# Patient Record
Sex: Female | Born: 1935 | Race: White | Hispanic: No | Smoking: Never smoker
Health system: Southern US, Community
[De-identification: ages and names within clinical notes are randomized; demographics above are authoritative.]

## PROBLEM LIST (undated history)

## (undated) DIAGNOSIS — E7800 Pure hypercholesterolemia, unspecified: Secondary | ICD-10-CM

## (undated) DIAGNOSIS — M1990 Unspecified osteoarthritis, unspecified site: Secondary | ICD-10-CM

## (undated) DIAGNOSIS — Z9889 Other specified postprocedural states: Secondary | ICD-10-CM

## (undated) DIAGNOSIS — K8020 Calculus of gallbladder without cholecystitis without obstruction: Secondary | ICD-10-CM

## (undated) DIAGNOSIS — K219 Gastro-esophageal reflux disease without esophagitis: Secondary | ICD-10-CM

## (undated) DIAGNOSIS — I639 Cerebral infarction, unspecified: Secondary | ICD-10-CM

## (undated) DIAGNOSIS — R112 Nausea with vomiting, unspecified: Secondary | ICD-10-CM

## (undated) DIAGNOSIS — I10 Essential (primary) hypertension: Secondary | ICD-10-CM

## (undated) HISTORY — PX: JOINT REPLACEMENT: SHX530

## (undated) HISTORY — PX: TOTAL HIP ARTHROPLASTY: SHX124

## (undated) HISTORY — PX: CATARACT EXTRACTION W/ INTRAOCULAR LENS IMPLANT: SHX1309

---
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
## 1999-08-18 ENCOUNTER — Encounter: Payer: Self-pay | Admitting: Family Medicine

## 1999-08-18 ENCOUNTER — Encounter: Admission: RE | Admit: 1999-08-18 | Discharge: 1999-08-18 | Payer: Self-pay | Admitting: Family Medicine

## 2000-08-20 ENCOUNTER — Encounter: Payer: Self-pay | Admitting: Family Medicine

## 2000-08-20 ENCOUNTER — Encounter: Admission: RE | Admit: 2000-08-20 | Discharge: 2000-08-20 | Payer: Self-pay | Admitting: Family Medicine

## 2001-08-22 ENCOUNTER — Encounter: Payer: Self-pay | Admitting: Family Medicine

## 2001-08-22 ENCOUNTER — Encounter: Admission: RE | Admit: 2001-08-22 | Discharge: 2001-08-22 | Payer: Self-pay | Admitting: Family Medicine

## 2002-08-26 ENCOUNTER — Encounter: Admission: RE | Admit: 2002-08-26 | Discharge: 2002-08-26 | Payer: Self-pay | Admitting: Family Medicine

## 2002-08-26 ENCOUNTER — Encounter: Payer: Self-pay | Admitting: Family Medicine

## 2004-01-06 ENCOUNTER — Encounter: Admission: RE | Admit: 2004-01-06 | Discharge: 2004-01-06 | Payer: Self-pay | Admitting: Family Medicine

## 2005-01-24 ENCOUNTER — Encounter: Admission: RE | Admit: 2005-01-24 | Discharge: 2005-01-24 | Payer: Self-pay | Admitting: Family Medicine

## 2005-06-13 ENCOUNTER — Encounter: Admission: RE | Admit: 2005-06-13 | Discharge: 2005-06-13 | Payer: Self-pay | Admitting: Family Medicine

## 2005-10-18 ENCOUNTER — Inpatient Hospital Stay (HOSPITAL_COMMUNITY): Admission: RE | Admit: 2005-10-18 | Discharge: 2005-10-24 | Payer: Self-pay | Admitting: Orthopedic Surgery

## 2005-10-23 ENCOUNTER — Ambulatory Visit: Payer: Self-pay | Admitting: Physical Medicine & Rehabilitation

## 2006-01-25 ENCOUNTER — Encounter: Admission: RE | Admit: 2006-01-25 | Discharge: 2006-01-25 | Payer: Self-pay | Admitting: Family Medicine

## 2006-05-28 ENCOUNTER — Inpatient Hospital Stay (HOSPITAL_COMMUNITY): Admission: RE | Admit: 2006-05-28 | Discharge: 2006-05-31 | Payer: Self-pay | Admitting: Orthopedic Surgery

## 2007-01-30 ENCOUNTER — Encounter: Admission: RE | Admit: 2007-01-30 | Discharge: 2007-01-30 | Payer: Self-pay | Admitting: Family Medicine

## 2008-02-03 ENCOUNTER — Encounter: Admission: RE | Admit: 2008-02-03 | Discharge: 2008-02-03 | Payer: Self-pay | Admitting: Family Medicine

## 2010-06-17 NOTE — Op Note (Signed)
[name], [name] NO.:  [id number]   MEDICAL RECORD NO.:  [id number]          PATIENT TYPE:  INP   LOCATION:  NA                           FACILITY:  [facility]   PHYSICIAN:  Ollen Gross, M.D.    DATE OF BIRTH:  [birth year]   DATE OF PROCEDURE:  05/28/2006  DATE OF DISCHARGE:                               OPERATIVE REPORT   PREOPERATIVE DIAGNOSIS:  Osteoarthritis of right hip.   POSTOPERATIVE DIAGNOSIS:  Osteoarthritis of right hip.   PROCEDURE:  Right total hip arthroplasty.   SURGEON:  Ollen Gross, MD   ASSISTANT:  Avel Peace, PA-C   ANESTHESIA:  General.   BLOOD LOSS:  150 mL.   DRAIN:  None.   COMPLICATION:  None.   CLINICAL NOTE:  Stable, to Recovery.   BRIEF CLINICAL NOTE:  [name] is a 77 year old female with end-stage  arthritis of the right hip and progressive worsening pain and  dysfunction.  She has had a successful left total hip arthroplasty and  presents now for right total hip arthroplasty.   PROCEDURE IN DETAIL:  After successful administration of general  anesthetic, the patient is placed in the left lateral decubitus position  with the right side up and held with the hip positioner.  The right  lower extremity is isolated from her perineum with plastic drapes and  prepped and draped in the usual sterile fashion.  A short posterolateral  incision is made with a 10 blade through the subcutaneous tissue to the  level of the fascia lata, which is incised in line with the skin  incision.  Sciatic nerve is palpated and protected and the short  rotators isolated off the femur.  Capsulectomy is performed and the hip  is dislocated.  She had a grossly dysplastic femoral head.  Center of  the head is marked and a trial prosthesis placed such that the center of  the trial head corresponds to the center of native femoral head.  Osteotomy line is marked on the femoral neck and osteotomy made with an  oscillating saw.  Femoral head is  removed and then the femur retracted  anteriorly to gain acetabular exposure.   The acetabular retractors are placed and osteophytes and labrum removed.  Her acetabulum was quite dysplastic.  With the 43 reamer, I got her  medialized and then went up in increments of 2 to the expand to the rim  and got a good fit between the anterior and posterior columns.  I reamed  to 49 mm and a 50-mm Pinnacle acetabular shell was placed in anatomic  position and transfixed with 2 dome screws.  Trial 28-mm neutral +4  liner was placed.   Femur is prepared with a canal finder and irrigation.  Axial reaming is  performed up to 11.5 mm, proximal reaming to a 16B and the sleeve  machined to a small.  A 16B small trial sleeve is placed with a 16 x 11  stem and the first 36 +6 neck.  This gave too much length and offset and  I could not get  a good reduction.  I went to a 30 +4, which allowed for  excellent reduction with excellent stability, full extension, full  external rotation, 70 degrees of flexion, 40 degrees of adduction, 90  degrees of internal rotation and 90 degrees of flexion and 70 degrees of  internal rotation.  By placing the right leg on top of the left, it felt  as if the leg lengths were now equalized.  This right leg was about an  inch short prior.  The hip was then dislocated and all trials were  removed.  The permanent apex hole plug was placed into the acetabular  shell, then the permanent 28-mm neutral +4 Marathon liner is placed.  On  the femoral side, we placed a 16B small sleeve with the 16 x 11 stem and  30 +4 neck, matching her native anteversion.  The permanent 28 +0 head  is placed and the hip is reduced with the same stability parameters.  The wound is then copiously irrigated with saline solution and the short  rotators reattached to the femur through drill holes.  Fascia lata is  closed with interrupted #1 Vicryl, subcu closed with #1 and #2-0 Vicryl  and subcuticular  running 4-0 Monocryl.  The incision is then cleaned and  dried and Steri-Strips and a bulky sterile dressing applied.  She is  placed into a knee immobilizer, awakened and transported to Recovery in  stable condition.      Ollen Gross, M.D.  Electronically Signed     FA/MEDQ  D:  05/28/2006  T:  05/29/2006  Job:  [id number]

## 2010-06-17 NOTE — Discharge Summary (Signed)
NAMEKAROLINA, ZAMOR NO.:  [id number]   MEDICAL RECORD NO.:  [id number]          PATIENT TYPE:  INP   LOCATION:  1619                         FACILITY:  [facility]   PHYSICIAN:  Ollen Gross, M.D.    DATE OF BIRTH:  05-16-1933   DATE OF ADMISSION:  05/28/2006  DATE OF DISCHARGE:  05/31/2006                               DISCHARGE SUMMARY   ADMITTING DIAGNOSES:  1. Osteoarthritis, right hip.  2. Hypertension.  3. Reflux disease.  4. Hypercholesterolemia.  5. Osteoporosis.   DISCHARGE DIAGNOSES:  1. Osteoarthritis, right hip, status post right total hip.  2. Postoperative hypokalemia, improved.  3. Hypertension.  4. Reflux disease.  5. Hypercholesterolemia.  6. Osteoporosis.   PROCEDURE:  April 28, right total hip.   SURGEON:  Dr. Lequita Halt.   ASSISTANT:  Alexzandrew Julien Girt, PA-C.   CONSULTS:  None.   BRIEF HISTORY:  Ms. Goette is a 77 year old female with end-stage  arthritis of right hip, progressive worsening pain and dysfunction,  successful left total hip, now presents for right total hip.   LABORATORY DATA:  Preop CBC:  hemoglobin 13.5, hematocrit 38.4, white  cell count 5.6.  Postop hemoglobin 10.6, drifted down to 10.1 last  night.  H&H 9.4 and 26.3.  PT/PTT preop 12.8 and 31 respectively.  INR  1.  Serial pro times followed.  PT/INR 20.2 and 1.7.  Chem panel on  admission slightly elevated, total bili of 1.4.  Remaining Chem panel  within normal limits.  Serial BMETs were followed.  Electrolytes was  noted have a drop in the potassium at 4.7 to 3.2, back to 3.6.  Preop UA  negative.  Blood group type A+.   X-rays May 21, 2006, severe advanced OA right hip, previous left total  hip.   HOSPITAL COURSE:  The patient admitted to [facility],  tolerated procedure well.  Later send to recovery room, orthopedic  floor.  Given PCA and p.o. analgesic pain control following surgery.  Coumadin for DVT prophylaxis.  Did have a little bit of  nausea on the  evening of surgery, but was doing a little bit better on the morning of  day 1.  Started to get up out of bed with therapy.  Started back on her  home meds, and by day 2 she was doing much better.  No nausea.  Better  pain control.  Started getting up with PT.  She actually ambulated about  100 feet, and later 180 feet.  Hemoglobin looked good.  Dressing  changed.  Incision looked good.  By day 3 she was doing very well, more  independent, tolerating her meds was discharged home.   DISCHARGE/PLAN:  1. The patient was discharged home on May 31, 2006.  2. Discharge diagnoses, please see above.  3. Discharge meds:  Coumadin, Vicodin, Robaxin, Nu-Iron.   DIET:  Heart-healthy cardiac diet.   FOLLOWUP:  Two weeks.   ACTIVITY:  Partial weightbearing, 25-50%, home health PT, home health  nursing.  Total hip protocol, hip precautions.   DISPOSITION:  Home.   CONDITION ON DISCHARGE:  Improved.      Alexzandrew L. Perkins, P.A.C.      Ollen Gross, M.D.  Electronically Signed    ALP/MEDQ  D:  07/24/2006  T:  07/24/2006  Job:  [id number]   cc:   Ollen Gross, M.D.  Fax: [phone]   Gloriajean Dell. Andrey Campanile, M.D.  Fax: [phone]

## 2010-06-17 NOTE — Discharge Summary (Signed)
[name], [name] NO.:  [id number]   MEDICAL RECORD NO.:  [id number]          PATIENT TYPE:  INP   LOCATION:  1509                         FACILITY:  [facility]   PHYSICIAN:  Ollen Gross, M.D.    DATE OF BIRTH:  [birth year]   DATE OF ADMISSION:  10/18/2005  DATE OF DISCHARGE:  10/24/2005                                 DISCHARGE SUMMARY   ADMISSION DIAGNOSES:  1. Osteoarthritis left hip, end-stage with protrusio deformity.  2. Hypertension.  3. Hypercholesterolemia.  4. Reflux disease.  5. Osteoporosis.  6. Osteoarthritis.   DISCHARGE DIAGNOSES:  1. Osteoarthritis left hip, status post left total hip arthroplasty.  2. Postoperative nausea, improved.  3. Postoperative acute blood loss anemia, did not require transfusion.  4. Postoperative hyponatremia, improving.  5. Postoperative hypokalemia, improved.  6. Hypertension.  7. Hypercholesterolemia.  8. Reflux disease.  9. Osteoporosis.  10.Osteoarthritis.   PROCEDURE:  October 18, 2005, left total hip.  Surgeon Dr. Homero Fellers Aluisio.  Assistant, Alexzandrew L. Perkins, P.A.C.  Anesthesia general.   CONSULTATIONS:  Rehab services.   BRIEF HISTORY:  Ms. Funchess is a 77 year old female with end-stage arthritis  of both hips, left more symptomatic than right, that failed nonoperative  management.  Now presents for total hip arthroplasty.   LABORATORY DATA:  CBC on admission a hemoglobin of 14.1, hematocrit of 39.7,  white cell count normal at 5.1.  Postoperative hemoglobin 9.9 (drifted down  to 9.3 and stabilized).  Last H&H 9.3 and 25.8.  PT and PTT on admission  12.9 and 32 respectively.  INR 1.0.  Should have pro time to follow.  Last  know PT/INR 27.0 and INR 2.4.  Chem panel on admission slightly elevated  glucose of 117, elevated total bilirubin of 1.7.  The remaining Chem panel  within normal limits.  Serial BMETs were followed.  Sodium did drop from 139  down to 132 back up to 133 on the increased  potassium from 4.1 down to 2.9  back up to 4.0.  The remaining electrolytes remained within the normal  limits.  Calcium dropped from 9.6 to 7.2 back up to 8.9.  Preoperative UA  negative.  Blood group type A positive.  Portable hip and pelvis films:  Satisfactory left hip replacement.  Preoperative left hip film October 16, 2005:  Marked degenerative changes left hip, suspicious focal radiolucent  lesion in the right proximal femoral shaft.  It appears to be a definite  finding, particularly on lateral.  Repeat would be suggested to rule out  what appears to be a bony lesion.  EKG October 05, 2005, sinus rhythm,  borderline okay, confirmed by Dr. Benedetto Goad.   HOSPITAL COURSE:  Admitted to [facility].  Tolerated the procedure  well and later sent to recovery room and the orthopedic floor.  Started on  PCA and p.o. analgesic for pain control following surgery.  He had a  moderate nausea and the evening of surgery was doing a little bit better but  still had some intermittent nausea on the morning of postoperative day one.  Hemovac drain was continued.  Had a little bit of a low urinary output with  positive volume.  Given a mild dose of Lasix.  PCA was discontinued after  lunch.  BUN and creatinines were okay.  Monitored the fluid output.  Hemovac  drain was pulled on day one.  By day two, still had continued nausea.  PCA  had been discontinued, p.o. medications were switched, also added Reglan for  nausea and to help out with gastric emptying.  Had a low potassium, so  placed on potassium supplements.  Started getting up out of bed a little bit  more with therapy.  Hemoglobin was down to 9.3 but she was asymptomatic.  Very slow with therapy due to continued nausea.  By day three, the nausea  was starting to improve.  Hep-Lock b.i.d.  Potassium was slowly increasing.  Was ambulating probably about 40 feet by day four.  She was worried about  going home.  Therefore, rehab  consult was called.  The patient was seen in  consultation by rehab services.  Felt that if no assistance was available  that she probably would require skilled nursing facility, not appropriate  for inpatient rehab.  Continued to receive therapy.  She had been switched  over to Darvocet p.o. but due to the continued nausea she was switched over  to Ultram since her nausea was still limiting her therapy.  She was slowly  progressing with therapy.   By October 24, 2005, she had been seen back in rounds by Dr. Lequita Halt.  The  nausea had improved and resolved.  She was tolerating p.o. medications,  progressing with physical therapy, was getting up and ambulating now a 125  feet.  Her therapy did improve once the nausea was under better control.  She had been seen in rounds on October 24, 2005, doing well and was  discharged home.   DISCHARGE PLAN:  1. The patient was discharged home on October 24, 2005.  2. Discharge diagnosis, please see above.  3. Discharge medications:  Ultram, Robaxin, Coumadin.  4. Diet:  Low cholesterol diet.  5. Follow up in two weeks.  6. Activity:  She is partial weightbearing 25-50% to left lower extremity.      Home health physical therapy and home health nursing.  Hip precautions.      Total hip protocol.   DISPOSITION:  Condition.   CONDITION ON DISCHARGE:  Improving.      Alexzandrew L. Julien Girt, P.A.      Ollen Gross, M.D.  Electronically Signed    ALP/MEDQ  D:  10/24/2005  T:  10/25/2005  Job:  [id number]   cc:   Ollen Gross, M.D.  Fax: [phone]   Gloriajean Dell. Andrey Campanile, M.D.  Fax: [phone]   Rehab Services   Patient's chart

## 2010-06-17 NOTE — H&P (Signed)
[name], [name] NO.:  [id number]   MEDICAL RECORD NO.:  [id number]          PATIENT TYPE:  INP   LOCATION:  1519                         FACILITY:  [facility]   PHYSICIAN:  Ollen Gross, M.D.    DATE OF BIRTH:  [birth year]   DATE OF ADMISSION:  05/28/2006  DATE OF DISCHARGE:                              HISTORY & PHYSICAL   CHIEF COMPLAINT:  Right hip pain.   HISTORY OF PRESENT ILLNESS:  The patient is a 77 year old female well  known Dr. Homero Fellers Aluisio.  She has previously undergone a left total hip  back in September 2007 and doing very well with that.  Unfortunately the  right hip continues to be problematic.  She is known to have endstage  arthritis with bone-on-bone in the right hip throughout.  It is felt she  has reached the point where she would benefit from undergoing hip  replacement.  Risks and benefits discussed and the patient has elected  to proceed with surgery.   ALLERGIES:  NO KNOWN DRUG ALLERGIES.   INTOLERANCES:  ATIVAN makes her crazy.   CURRENT MEDICATIONS:  Atenolol, Lipitor, Tagamet, diclofenac, One-A-Day  vitamin, calcium plus D, fish oil.   PAST MEDICAL HISTORY:  1. Hypertension.  2. Reflux disease.  3. Hypercholesterolemia.  4. History of osteoporosis.  5. Osteoarthritis.   PAST SURGICAL HISTORY:  Left total hip replacement September 2007.   SOCIAL HISTORY:  Single.  She works as a Public house manager.  Nonsmoker.  No alcohol.  One child.  Sister-in-law will be assisting  with care after surgery.   FAMILY HISTORY:  Father deceased at age 16 with a history of stroke,  heart disease and MI.  Mother deceased at age 48 with arthritis.  She  has a brother deceased at age 77 with a history of aneurysm.  Another  brother deceased at age 66 with a history of stroke.  Another brother  with heart disease and MI.   REVIEW OF SYSTEMS:  GENERAL:  No fevers, chills, night sweats.  NEURO:  No seizures, syncope or paralysis.   RESPIRATORY:  No shortness of  breath, productive cough or hemoptysis.  CARDIOVASCULAR: No chest pain  or orthopnea.  GI: No nausea, vomiting, diarrhea or constipation.  GU:  No dysuria, hematuria or discharge.  MUSCULOSKELETAL:  Right hip.   PHYSICAL EXAMINATION:  VITAL SIGNS: Pulse 64, respirations 14, blood  pressure 142/58.  GENERAL: A 77 year old white female, well-nourished, well-developed,  petite frame, alert, oriented and cooperative.  Good historian.  HEENT: Normocephalic, atraumatic.  Pupils round and reactive.  Oropharynx clear.  EOMs intact.  She does have glasses.  NECK:  Supple.  Faint left carotid bruit, otherwise neck is supple and  negative.  CHEST:  Clear anterior posterior chest walls.  No rhonchi, rales or  wheezing.  HEART: Regular rate and rhythm.  No murmur.  S1, S2 noted.  ABDOMEN:  Soft, nontender.  Bowel sounds present.  BREASTS/GENITALIA:  Not done.  EXTREMITIES:  The right hip shows flexion of only 90, 0 internal  rotation, 5 degrees external rotation  and about 10 degrees of abduction.   IMPRESSION:  1. Osteoarthritis right hip.  2. Hypertension.  3. Reflux disease.  4. Hypercholesterolemia.  5. Osteoporosis.  6. Osteoarthritis.   PLAN:  The patient will be admitted to [facility] to undergo  right total hip replacement arthroplasty.  Surgery will be performed by  Dr. Ollen Gross.      Alexzandrew L. Perkins, P.A.C.      Ollen Gross, M.D.  Electronically Signed    ALP/MEDQ  D:  05/28/2006  T:  05/29/2006  Job:  [id number]   cc:   Gloriajean Dell. Andrey Campanile, M.D.  Fax: [phone]

## 2010-06-17 NOTE — Op Note (Signed)
NAME:  Kristen, [name] NO.:  [id number]   MEDICAL RECORD NO.:  [id number]          PATIENT TYPE:  INP   LOCATION:  0003                         FACILITY:  [facility]   PHYSICIAN:  Ollen Gross, M.D.    DATE OF BIRTH:  [birth year]   DATE OF PROCEDURE:  10/18/2005  DATE OF DISCHARGE:                                 OPERATIVE REPORT   PREOPERATIVE DIAGNOSIS:  Osteoarthritis left hip.   POSTOPERATIVE DIAGNOSIS:  Osteoarthritis left hip.   PROCEDURE:  Left total hip arthroplasty.   SURGEON:  Ollen Gross, M.D.   ASSISTANT:  Alexzandrew L. Julien Girt, P.A.   ANESTHESIA:  General.   ESTIMATED BLOOD LOSS:  100 mL.   DRAIN:  Hemovac x1.   COMPLICATIONS:  None.   CONDITION:  Stable to recovery.   BRIEF CLINICAL NOTE:  Ms. [name] is a 77 year old female, end-stage arthritis  of both hips, left more symptomatic than the right.  She has failed  nonoperative management and presents for total hip arthroplasty.   PROCEDURE IN DETAIL:  After successful administration of general anesthetic,  the patient is placed in a right lateral decubitus position with the left  side up and held with the hip positioner.  Left lower extremity was isolated  from her perineum with plastic drapes and prepped and draped in the usual  sterile fashion.  Short posterolateral incision is made with a 10 blade  through subcutaneous tissue to the level of the fascia lata which is incised  in line with the skin incision.  Sciatic nerve is palpated and protected and  the short rotator is isolated off the femur.  Capsulectomy is performed and  the hip is dislocated.  Center of the femoral head is marked and trial  prosthesis placed such that the center of trial head corresponds to the  center of native femoral head.  Osteotomy lines marked on the femoral neck  and osteotomy made with an oscillating saw.  Femoral head is removed and the  femur retracted anteriorly to gain acetabular exposure.   Acetabular labrum and osteophytes are removed.  Acetabular reaming starts at  43 mm to get central and then we reamed up to 49 mm.  We were at the medial  wall of the pelvis.  I took some of the reamings and used autograft there to  build this out from the medial wall.  A 50 mm pinnacle acetabular shell was  then placed in anatomic position and transfixed with two dome screws.  A 28  mm neutral +4 trial liner is placed.   Femur is prepared with the canal finder and irrigation.  Axial reaming is  performed to 11.5 mm proximal reaming to a 16 B and the sleeve machine to a  small.  A 16 B small trial sleeve is placed with 16 x 11 stem and a 36 +6  neck matching native anteversion.  A 28 +0  head is placed.  Hip is reduced  with great stability, full extension, full external rotation, 70 degrees  flexion, 40 degrees adduction, 90 degrees internal rotation and 90 degrees  flexion, 70 degrees internal rotation.  By placing the left leg on top of  the right, it felt that the legs were within a couple millimeters of being  fully equal.  The hip is then dislocated and all trials are removed.  The  permanent apex hole eliminator is placed into the acetabular shell and a  permanent 28 mm neutral +4 Marathon liner is placed.  On the femoral side,  the 16 B small sleeve is placed at the 16 x 11 stem and a 36 +6 neck  matching her native anteversion.  A 28 +0  head is placed and the hip  reduced to the same stability parameters.  Wound is then copiously irrigated  with saline solution and the short rotator is reattached to the femur.  Fascia lata is closed over a Hemovac drain with interrupted #1 Vicryl, subcu  closed with #1 and 2-0 Vicryl and subcuticular running 4-0 Monocryl.  Incision is cleaned and dried.  Steri-Strips and bulky sterile dressing  applied.  She is awakened and transported to recovery in stable condition.      Ollen Gross, M.D.  Electronically Signed     FA/MEDQ  D:   10/18/2005  T:  10/19/2005  Job:  [id number]

## 2010-06-17 NOTE — H&P (Signed)
NAME:  [name], [name] NO.:  [id number]   MEDICAL RECORD NO.:  [id number]          PATIENT TYPE:  INP   LOCATION:  NA                           FACILITY:  [facility]   PHYSICIAN:  Ollen Gross, M.D.    DATE OF BIRTH:  02/17/1933   DATE OF ADMISSION:  10/18/2005  DATE OF DISCHARGE:                                HISTORY & PHYSICAL   DATE OF OFFICE VISIT AND HISTORY AND PHYSICAL:  October 10, 2005.   CHIEF COMPLAINT:  Left hip pain.   HISTORY OF PRESENT ILLNESS:  The patient is a 77 year old female who has  been seen by Dr. Lequita Halt for ongoing left hip pain.  She has been having  worsening pain for quite some time now.  It has progressively gotten worse.  She is seen in the office.  Her x-rays show severe end-stage arthritis in  the left hip with bone-on-bone near fusion with also protrusio deformity  into the pelvis.  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  The left hip is more symptomatic even though she does have right  hip pain.  It is felt this has reached a point where she could benefit from  undergoing total hip replacement.  Risks and benefits have been discussed,  and she elects to proceed with surgery.   ALLERGIES:  No known drug allergies.   CURRENT MEDICATIONS:  Atenolol, Lipitor, diclofenac, multivitamin, fish oil,  calcium, Tagamet.   PAST MEDICAL HISTORY:  1. Hypertension.  2. Reflux disease.  3. Hypercholesterolemia.  4. Osteoporosis.  5. Osteoarthritis.   PAST SURGICAL HISTORY:  Negative.   SOCIAL HISTORY:  Single.  She works as a Public house manager.  Nonsmoker, no alcohol.  One child.  Her daughter will be assisting with care  after surgery.   FAMILY HISTORY:  Father deceased age at 33 with history of MI.  Mother  deceased  at age 70 with arthritis.  Has a brother deceased at age 88 with  history of aneurysm.  Another brother deceased at age 67 with history of  stroke.  Another brother currently living,  age 37, with heart disease.  That  brother also had an MI.   REVIEW OF SYSTEMS:  GENERAL: No fever, chills, or night sweats.  NEUROLOGIC:  No seizure, CVA, paralysis, RESPIRATORY: No shortness of breath, productive  cough, or hemoptysis.  CARDIOVASCULAR: No chest pain, angina, orthopnea. GI:  No nausea, vomiting, diarrhea, constipation. GU: No dysuria, hematuria,  discharge.  MUSCULOSKELETAL: Left hip.   PHYSICAL EXAMINATION:  VITAL SIGNS: Pulse 64, respirations 14, blood  pressure 142/62.  GENERAL: A 77 year old white female, well-nourished, well-developed, petite  frame, alert, oriented cooperative.  Good historian.  HEENT:  Normocephalic and atraumatic.  Pupils equal, round, and reactive.  Noted to wear glasses.  EOMs intact.  Oropharynx clear.  NECK: Supple.  CHEST:  Clear anterior and posterior chest wall with no rhonchi, rales, or  wheezing.  HEART: Regular rhythm with a faint early systolic murmur graded 2/6, best  heard over mitral point.  ABDOMEN:  Soft, flat, nontender.  Bowel sounds present.  RECTAL/BREASTS/GENITALIA: Not done, not pertinent to present illness.  EXTREMITIES:  Left hip: Flexion of left hip is only 90 degrees.  There is 0  internal rotation, 0 external rotation, about 10 degrees of abduction, about  5 degrees of adduction.   IMPRESSION:  1. Osteoarthritis, left hip, end stage, with protrusio deformity.  2. Hypertension.  3. Hypercholesterolemia.  4. Reflux disease.  5. Osteoporosis.  6. Osteoarthritis.   PLAN:  The patient will be [facility] to [facility] to undergo left  total hip arthroplasty with acetabular autografting.  Surgery will be  performed by Dr. Ollen Gross.  Her medical physician, Dr. Rikki Spearing,  will be notified and consulted if needed for medical assistance with patient  during hospital course.      Alexzandrew L. Julien Girt, P.A.      Ollen Gross, M.D.  Electronically Signed    ALP/MEDQ  D:  10/17/2005  T:   10/17/2005  Job:  [id number]   cc:   Gloriajean Dell. Andrey Campanile, M.D.  Fax: [phone]

## 2015-12-21 ENCOUNTER — Other Ambulatory Visit: Payer: Self-pay | Admitting: Gastroenterology

## 2015-12-21 DIAGNOSIS — R1011 Right upper quadrant pain: Secondary | ICD-10-CM

## 2015-12-21 DIAGNOSIS — R110 Nausea: Secondary | ICD-10-CM

## 2015-12-23 ENCOUNTER — Encounter (HOSPITAL_COMMUNITY): Payer: Self-pay | Admitting: Emergency Medicine

## 2015-12-23 ENCOUNTER — Observation Stay (HOSPITAL_COMMUNITY): Payer: Medicare Other

## 2015-12-23 ENCOUNTER — Emergency Department (HOSPITAL_COMMUNITY): Payer: Medicare Other

## 2015-12-23 ENCOUNTER — Inpatient Hospital Stay (HOSPITAL_COMMUNITY)
Admission: EM | Admit: 2015-12-23 | Discharge: 2015-12-25 | DRG: 689 | Disposition: A | Discharge status: Home Health | Payer: Medicare Other | Attending: Internal Medicine | Admitting: Internal Medicine

## 2015-12-23 DIAGNOSIS — E871 Hypo-osmolality and hyponatremia: Secondary | ICD-10-CM | POA: Diagnosis not present

## 2015-12-23 DIAGNOSIS — Z811 Family history of alcohol abuse and dependence: Secondary | ICD-10-CM

## 2015-12-23 DIAGNOSIS — E860 Dehydration: Secondary | ICD-10-CM | POA: Diagnosis present

## 2015-12-23 DIAGNOSIS — Z66 Do not resuscitate: Secondary | ICD-10-CM | POA: Diagnosis present

## 2015-12-23 DIAGNOSIS — I10 Essential (primary) hypertension: Secondary | ICD-10-CM | POA: Diagnosis present

## 2015-12-23 DIAGNOSIS — E876 Hypokalemia: Secondary | ICD-10-CM | POA: Diagnosis present

## 2015-12-23 DIAGNOSIS — G9340 Encephalopathy, unspecified: Secondary | ICD-10-CM | POA: Diagnosis not present

## 2015-12-23 DIAGNOSIS — K219 Gastro-esophageal reflux disease without esophagitis: Secondary | ICD-10-CM | POA: Diagnosis present

## 2015-12-23 DIAGNOSIS — Z8249 Family history of ischemic heart disease and other diseases of the circulatory system: Secondary | ICD-10-CM

## 2015-12-23 DIAGNOSIS — N390 Urinary tract infection, site not specified: Principal | ICD-10-CM | POA: Diagnosis present

## 2015-12-23 DIAGNOSIS — G9341 Metabolic encephalopathy: Secondary | ICD-10-CM | POA: Diagnosis present

## 2015-12-23 DIAGNOSIS — N3000 Acute cystitis without hematuria: Secondary | ICD-10-CM | POA: Diagnosis not present

## 2015-12-23 DIAGNOSIS — R109 Unspecified abdominal pain: Secondary | ICD-10-CM

## 2015-12-23 DIAGNOSIS — R1011 Right upper quadrant pain: Secondary | ICD-10-CM

## 2015-12-23 DIAGNOSIS — M810 Age-related osteoporosis without current pathological fracture: Secondary | ICD-10-CM | POA: Diagnosis present

## 2015-12-23 DIAGNOSIS — K8020 Calculus of gallbladder without cholecystitis without obstruction: Secondary | ICD-10-CM | POA: Diagnosis present

## 2015-12-23 DIAGNOSIS — Z823 Family history of stroke: Secondary | ICD-10-CM

## 2015-12-23 HISTORY — DX: Essential (primary) hypertension: I10

## 2015-12-23 LAB — COMPREHENSIVE METABOLIC PANEL
ALT: 15 U/L (ref 14–54)
AST: 27 U/L (ref 15–41)
Albumin: 4.1 g/dL (ref 3.5–5.0)
Alkaline Phosphatase: 62 U/L (ref 38–126)
Anion gap: 10 (ref 5–15)
BILIRUBIN TOTAL: 1.3 mg/dL — AB (ref 0.3–1.2)
BUN: 7 mg/dL (ref 6–20)
CALCIUM: 9.3 mg/dL (ref 8.9–10.3)
CHLORIDE: 86 mmol/L — AB (ref 101–111)
CO2: 28 mmol/L (ref 22–32)
CREATININE: 0.69 mg/dL (ref 0.44–1.00)
Glucose, Bld: 121 mg/dL — ABNORMAL HIGH (ref 65–99)
Potassium: 3.5 mmol/L (ref 3.5–5.1)
Sodium: 124 mmol/L — ABNORMAL LOW (ref 135–145)
TOTAL PROTEIN: 6.4 g/dL — AB (ref 6.5–8.1)

## 2015-12-23 LAB — LIPASE, BLOOD: LIPASE: 47 U/L (ref 11–51)

## 2015-12-23 LAB — CBC
HCT: 36.8 % (ref 36.0–46.0)
Hemoglobin: 13.9 g/dL (ref 12.0–15.0)
MCH: 31.2 pg (ref 26.0–34.0)
MCHC: 37.8 g/dL — ABNORMAL HIGH (ref 30.0–36.0)
MCV: 82.7 fL (ref 78.0–100.0)
PLATELETS: 184 10*3/uL (ref 150–400)
RBC: 4.45 MIL/uL (ref 3.87–5.11)
RDW: 12.2 % (ref 11.5–15.5)
WBC: 6.2 10*3/uL (ref 4.0–10.5)

## 2015-12-23 LAB — BASIC METABOLIC PANEL
Anion gap: 10 (ref 5–15)
BUN: 5 mg/dL — AB (ref 6–20)
CO2: 26 mmol/L (ref 22–32)
Calcium: 8.4 mg/dL — ABNORMAL LOW (ref 8.9–10.3)
Chloride: 90 mmol/L — ABNORMAL LOW (ref 101–111)
Creatinine, Ser: 0.68 mg/dL (ref 0.44–1.00)
GFR calc Af Amer: >60 mL/min (ref >60)
GLUCOSE: 107 mg/dL — AB (ref 65–99)
POTASSIUM: 3.3 mmol/L — AB (ref 3.5–5.1)
Sodium: 126 mmol/L — ABNORMAL LOW (ref 135–145)

## 2015-12-23 LAB — URINALYSIS, ROUTINE W REFLEX MICROSCOPIC
Glucose, UA: NEGATIVE mg/dL
Hgb urine dipstick: NEGATIVE
KETONES UR: 15 mg/dL — AB
NITRITE: POSITIVE — AB
PROTEIN: 30 mg/dL — AB
Specific Gravity, Urine: 1.025 (ref 1.005–1.030)
pH: 6.5 (ref 5.0–8.0)

## 2015-12-23 LAB — URINE MICROSCOPIC-ADD ON

## 2015-12-23 MED ORDER — ENOXAPARIN SODIUM 40 MG/0.4ML ~~LOC~~ SOLN
40.0000 mg | SUBCUTANEOUS | Status: DC
  Administered 2015-12-23 – 2015-12-24 (×2): 40 mg via SUBCUTANEOUS
  Filled 2015-12-23 (×2): qty 0.4

## 2015-12-23 MED ORDER — SODIUM CHLORIDE 0.9 % IV BOLUS (SEPSIS)
1000.0000 mL | Freq: Once | INTRAVENOUS | Status: AC
  Administered 2015-12-23: 1000 mL via INTRAVENOUS

## 2015-12-23 MED ORDER — IOPAMIDOL (ISOVUE-300) INJECTION 61%
INTRAVENOUS | Status: AC
  Administered 2015-12-23: 100 mL
  Filled 2015-12-23: qty 100

## 2015-12-23 MED ORDER — AMLODIPINE BESYLATE 5 MG PO TABS
5.0000 mg | ORAL_TABLET | Freq: Every day | ORAL | Status: DC
  Administered 2015-12-24 – 2015-12-25 (×2): 5 mg via ORAL
  Filled 2015-12-23 (×2): qty 1

## 2015-12-23 MED ORDER — ASPIRIN EC 81 MG PO TBEC
81.0000 mg | DELAYED_RELEASE_TABLET | Freq: Every day | ORAL | Status: DC
  Administered 2015-12-23 – 2015-12-25 (×3): 81 mg via ORAL
  Filled 2015-12-23 (×3): qty 1

## 2015-12-23 MED ORDER — ONDANSETRON HCL 4 MG PO TABS: 4.0000 mg | ORAL_TABLET | Freq: Four times a day (QID) | ORAL | Status: DC | PRN

## 2015-12-23 MED ORDER — CEFTRIAXONE SODIUM 1 G IJ SOLR
1.0000 g | Freq: Once | INTRAMUSCULAR | Status: AC
  Administered 2015-12-23: 1 g via INTRAVENOUS
  Filled 2015-12-23: qty 10

## 2015-12-23 MED ORDER — POLYETHYLENE GLYCOL 3350 17 G PO PACK: 17.0000 g | PACK | Freq: Every day | ORAL | Status: DC | PRN

## 2015-12-23 MED ORDER — ATENOLOL 50 MG PO TABS
50.0000 mg | ORAL_TABLET | Freq: Every day | ORAL | Status: DC
  Administered 2015-12-24 – 2015-12-25 (×2): 50 mg via ORAL
  Filled 2015-12-23 (×2): qty 1

## 2015-12-23 MED ORDER — VITAMIN B-1 100 MG PO TABS
100.0000 mg | ORAL_TABLET | Freq: Every day | ORAL | Status: DC
  Administered 2015-12-24 – 2015-12-25 (×2): 100 mg via ORAL
  Filled 2015-12-23 (×3): qty 1

## 2015-12-23 MED ORDER — METOCLOPRAMIDE HCL 5 MG/ML IJ SOLN
5.0000 mg | Freq: Two times a day (BID) | INTRAMUSCULAR | Status: DC
  Administered 2015-12-23 – 2015-12-25 (×4): 5 mg via INTRAVENOUS
  Filled 2015-12-23 (×4): qty 2

## 2015-12-23 MED ORDER — BISACODYL 10 MG RE SUPP: 10.0000 mg | Freq: Every day | RECTAL | Status: DC | PRN

## 2015-12-23 MED ORDER — SODIUM CHLORIDE 0.9 % IV SOLN
INTRAVENOUS | Status: AC
  Administered 2015-12-23: 22:00:00 via INTRAVENOUS

## 2015-12-23 MED ORDER — ACETAMINOPHEN 650 MG RE SUPP: 650.0000 mg | Freq: Four times a day (QID) | RECTAL | Status: DC | PRN

## 2015-12-23 MED ORDER — ACETAMINOPHEN 325 MG PO TABS: 650.0000 mg | ORAL_TABLET | Freq: Four times a day (QID) | ORAL | Status: DC | PRN

## 2015-12-23 MED ORDER — ONDANSETRON HCL 4 MG/2ML IJ SOLN: 4.0000 mg | Freq: Four times a day (QID) | INTRAMUSCULAR | Status: DC | PRN

## 2015-12-23 MED ORDER — SENNA 8.6 MG PO TABS
1.0000 | ORAL_TABLET | Freq: Two times a day (BID) | ORAL | Status: DC
  Administered 2015-12-23 – 2015-12-25 (×3): 8.6 mg via ORAL
  Filled 2015-12-23 (×3): qty 1

## 2015-12-23 MED ORDER — CEFTRIAXONE SODIUM 1 G IJ SOLR
1.0000 g | INTRAMUSCULAR | Status: DC
  Administered 2015-12-24 – 2015-12-25 (×2): 1 g via INTRAVENOUS
  Filled 2015-12-23 (×3): qty 10

## 2015-12-23 NOTE — ED Notes (Signed)
Jessica (PA) at bedside.

## 2015-12-23 NOTE — ED Notes (Signed)
The pts daughter states that she thinks the pt has been confused.

## 2015-12-23 NOTE — Progress Notes (Signed)
Received report from MasthopeBrooke, ED RN.

## 2015-12-23 NOTE — ED Notes (Signed)
Patient transported to Ultrasound 

## 2015-12-23 NOTE — ED Triage Notes (Signed)
Pt sts nausea x 2 weeks with fall this week; pt sts  Phenergan made her "see things"

## 2015-12-23 NOTE — ED Provider Notes (Signed)
[facility]-EMERGENCY DEPT Provider Note   CSN: [id number] Arrival date & time: 12/23/15  1311     History   Chief Complaint Chief Complaint  Patient presents with  . Nausea    HPI ESHANI MAESTRE is a 82 y.o. female.  HPI patient is an 82 year old female with history of osteoporosis, HTN, esophageal reflux who presents the emergency department 2 weeks of nausea and dry heaves. Patient was seen by her PCP for this and was prescribed Phenergan. She was seen by GI, Dr. Modesto Charon, who scheduled an right upper quadrant ultrasound. She has not gotten this yet. Daughter states patient had similar symptoms roughly 1 year ago but they only lasted for roughly 2 or 3 days. Patient states food helps the nausea. She's not had any episodes of vomiting. She states sometimes her abdomen feels like it's "in a ball". Patient states she's been having intermittent right upper quadrant abdominal pain since before the onset of nausea but was unsure of how long. Patient states hallucinations on Phenergan. She is decrease the dose in these hallucinations are resolved. Patient fell one week ago on her behind his complaining of bilateral hip and right rib pain. Daughter feels pt is very confused. Patient denies hematemesis, hematochezia, black tarry stools, diarrhea, dysuria, hematuria, dizziness, syncope, chest pain, shortness of breath, visual changes.  History reviewed. No pertinent past medical history.  Patient Active Problem List   Diagnosis Date Noted  . Acute encephalopathy 12/23/2015  . UTI (urinary tract infection) 12/23/2015  . Hyponatremia 12/23/2015  . Dehydration 12/23/2015    History reviewed. No pertinent surgical history.  OB History    No data available       Home Medications    Prior to Admission medications   Medication Sig Start Date End Date Taking? Authorizing Provider  amLODipine (NORVASC) 5 MG tablet Take 5 mg by mouth daily.   Yes Historical Provider, MD  atenolol (TENORMIN) 50 MG  tablet Take 50 mg by mouth daily.   Yes Historical Provider, MD  hydrochlorothiazide (HYDRODIURIL) 12.5 MG tablet Take 12.5 mg by mouth daily.   Yes Historical Provider, MD    Family History Family History  Problem Relation Age of Onset  . CAD Father   . Stroke Brother   . Alcohol abuse Brother   . CAD Brother     Social History Social History  Substance Use Topics  . Smoking status: Never Smoker  . Smokeless tobacco: Never Used  . Alcohol use No     Allergies   Ativan [lorazepam]   Review of Systems Review of Systems  Constitutional: Negative for diaphoresis and fever.  HENT: Negative for trouble swallowing.   Eyes: Negative for visual disturbance.  Respiratory: Negative for cough and shortness of breath.   Cardiovascular: Negative for chest pain.  Gastrointestinal: Positive for abdominal pain and nausea. Negative for abdominal distention, blood in stool, diarrhea and vomiting.  Genitourinary: Negative for dysuria and hematuria.  Skin: Negative for rash.  Neurological: Negative for dizziness, syncope, weakness and numbness.  Psychiatric/Behavioral: Positive for confusion.  All other systems reviewed and are negative.    Physical Exam Updated Vital Signs BP 118/78   Pulse 66   Temp 97.6 F (36.4 C) (Oral)   Resp 16   Ht 5' (1.524 m)   Wt 49.9 kg   SpO2 100%   BMI 21.48 kg/m   Physical Exam  Constitutional: She appears well-developed and well-nourished. No distress.  Anxious, moving around in bed, talking fast  HENT:  Head: Normocephalic and atraumatic.  Eyes: Conjunctivae are normal.  Cardiovascular: Normal rate, regular rhythm and normal heart sounds.  Exam reveals no friction rub.   No murmur heard. Pulses:      Radial pulses are 2+ on the right side, and 2+ on the left side.       Dorsalis pedis pulses are 2+ on the right side, and 2+ on the left side.  Pulmonary/Chest: Effort normal and breath sounds normal. No respiratory distress. She has no  decreased breath sounds. She has no wheezes. She has no rhonchi. She has no rales.  Mild TTP of right lower posterior lateral ribs  Abdominal: Soft. Bowel sounds are normal. She exhibits no distension. There is tenderness in the right upper quadrant. There is positive Murphy's sign. There is no rigidity, no rebound, no guarding, no CVA tenderness and no tenderness at McBurney's point.  Musculoskeletal: Normal range of motion.  Neurological: She is alert. Coordination normal.  Skin: Skin is warm and dry. She is not diaphoretic.  Psychiatric: She has a normal mood and affect. Her behavior is normal.  Nursing note and vitals reviewed.    ED Treatments / Results  Labs (all labs ordered are listed, but only abnormal results are displayed) Labs Reviewed  COMPREHENSIVE METABOLIC PANEL - Abnormal; Notable for the following:       Result Value   Sodium 124 (*)    Chloride 86 (*)    Glucose, Bld 121 (*)    Total Protein 6.4 (*)    Total Bilirubin 1.3 (*)    All other components within normal limits  CBC - Abnormal; Notable for the following:    MCHC 37.8 (*)    All other components within normal limits  URINALYSIS, ROUTINE W REFLEX MICROSCOPIC (NOT AT [facility]) - Abnormal; Notable for the following:    APPearance CLOUDY (*)    Bilirubin Urine SMALL (*)    Ketones, ur 15 (*)    Protein, ur 30 (*)    Nitrite POSITIVE (*)    Leukocytes, UA TRACE (*)    All other components within normal limits  URINE MICROSCOPIC-ADD ON - Abnormal; Notable for the following:    Squamous Epithelial / LPF 0-5 (*)    Bacteria, UA FEW (*)    All other components within normal limits  URINE CULTURE  LIPASE, BLOOD  SODIUM, URINE, RANDOM  CREATININE, URINE, RANDOM  OSMOLALITY, URINE    EKG  EKG Interpretation None       Radiology Koreas Abdomen Complete  Result Date: 12/23/2015 CLINICAL DATA:  82 year old female with abdominal pain, nausea and vomiting for 2 weeks. EXAM: ABDOMEN ULTRASOUND COMPLETE  COMPARISON:  None. FINDINGS: Gallbladder: Mobile gallstones are identified, the largest measuring 1.3 cm. There is no evidence of gallbladder wall thickening, pericholecystic fluid or sonographic Murphy's sign. Common bile duct: Diameter: 5 mm. There is no evidence of intrahepatic or extrahepatic biliary dilatation. Liver: No focal lesion identified. Within normal limits in parenchymal echogenicity. IVC: No abnormality visualized. Pancreas: Visualized portion unremarkable. Spleen: Size and appearance within normal limits. Right Kidney: Length: 10.6 cm. Cortical thinning noted. Echogenicity within normal limits. No mass or hydronephrosis visualized. Left Kidney: Length: 10 cm. Cortical thinning noted. 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 Echogenicity within normal limits. No mass or hydronephrosis visualized. Abdominal aorta: No aneurysm visualized. Abdominal aortic atherosclerotic calcifications are present. Other findings: None. IMPRESSION: No evidence of acute abnormality. Cholelithiasis without CT evidence of acute  cholecystitis. No biliary dilatation. Abdominal aortic atherosclerosis. Electronically Signed   By: Harmon PierJeffrey  Hu M.D.   On: 12/23/2015 18:02    Procedures Procedures (including critical care time)  Medications Ordered in ED Medications  metoCLOPramide (REGLAN) injection 5 mg (not administered)  iopamidol (ISOVUE-300) 61 % injection (not administered)  sodium chloride 0.9 % bolus 1,000 mL (1,000 mLs Intravenous New Bag/Given 12/23/15 1708)  cefTRIAXone (ROCEPHIN) 1 g in dextrose 5 % 50 mL IVPB (1 g Intravenous New Bag/Given 12/23/15 1901)     Initial Impression / Assessment and Plan / ED Course  I have reviewed the triage vital signs and the nursing notes.  Pertinent labs & imaging results that were available during my care of the patient were reviewed by me and considered in my medical decision making (see chart for  details).  Clinical Course    Pt with nausea, abdominal pain and dry heaving. Daughter states patient is confused. Pt was found to be hyponatremic and hypochloremic. Patient is also found to have a UTI. She was treated with 1 g of Rocephin in the ED. Due to AMS and electrolyte abnormality, hospitalist team was consulted for admission.  Dr. Adela Glimpseoutova will admit the pt for further evaluation and treatment. Thank you Dr. Adela Glimpseoutova for your consult, time and care of this patient.   Pt case discussed and pt seen by Dr. Eudelia Bunchardama who agrees with the above plan.   Final Clinical Impressions(s) / ED Diagnoses   Final diagnoses:  Abdominal pain    New Prescriptions New Prescriptions   No medications on file     Jerre SimonJessica L Ayodeji Keimig, [place] 12/23/15 2014    Nira ConnPedro Eduardo Cardama, MD 12/24/15 0120

## 2015-12-23 NOTE — H&P (Signed)
[name]:[id number] DOB: 1933/07/15 DOA: 12/23/2015     PCP: [facility]   Outpatient Specialists: orthopedics Al[place]io Patient coming from:    home Lives alone,        Chief Complaint: confusion  HPI: [name] is a 82 y.o. female with medical history significant of HTN osteoporosis, GERD    Presented with 2 week history of nausea  Food  makes nausea better. She's been having right upper quadrant pain on occasion she has been [facility] felt that she most likely has gastroenteritis. She started taking progressively confused in this week. She  had a fall ( stumbled backwards) a week ago and since then has had rib pain and hip pains bilaterally. Her PCP. Prescribed Phenergan and she was seen by GI with plant to have  upper quadrant ultrasound. No vomiting no diarrhea. Sometimes her abdomen feels like its in a ball.   She had not had any fevers or chills no melena no chest pain or syncope no lightheadedness no shortness of breath she has minimal confusion although patient denies. Reports after she took phenergan she was seeing monsters.      IN ER:  Temp (24hrs), Avg:97.6 F (36.4 C), Min:97.6 F (36.4 C), Max:97.6 F (36.4 C)     BP 147/70 NA 124 Cl 86 Cr 0.69  RUQ  abd non acute cholelithiasis no cholecystitis Following Medications were ordered in ER: Medications  cefTRIAXone (ROCEPHIN) 1 g in dextrose 5 % 50 mL IVPB (not administered)  sodium chloride 0.9 % bolus 1,000 mL (1,000 mLs Intravenous New Bag/Given 12/23/15 1708)       Hospitalist was called for admission for dehydration hyponatremia and acute encephalopathy  Review of Systems:    Pertinent positives include:  fatigue, nausea, dry heaves,weight loss , poor PO intake. abdominal pain, stuffy nose and cough  Constitutional:  No weight loss, night sweats, Fevers, chills HEENT:  No headaches, Difficulty swallowing,Tooth/dental problems,Sore  throat,  No sneezing, itching, ear ache, nasal congestion, post nasal drip,  Cardio-vascular:  No chest pain, Orthopnea, PND, anasarca, dizziness, palpitations.no Bilateral lower extremity swelling  GI:  No heartburn, indigestion,  vomiting, diarrhea, change in bowel habits  melena, blood in stool, hematemesis Resp:  no shortness of breath at rest. No dyspnea on exertion, No excess mucus, no productive cough, No non-productive cough, No coughing up of blood.No change in color of mucus.No wheezing. Skin:  no rash or lesions. No jaundice GU:  no dysuria, change in color of urine, no urgency or frequency. No straining to urinate.  No flank pain.  Musculoskeletal:  No joint pain or no joint swelling. No decreased range of motion. No back pain.  Psych:  No change in mood or affect. No depression or anxiety. No memory loss.  Neuro: no localizing neurological complaints, no tingling, no weakness, no double vision, no gait abnormality, no slurred speech, no confusion  As per HPI otherwise 10 point review of systems negative.   Past Medical History: History reviewed. No pertinent past medical history. History reviewed. No pertinent surgical history.   Social History:  Ambulatory   independently  Or cane.     reports that she has never smoked. She has never used smokeless tobacco. She reports that she does not drink alcohol or use drugs.  Allergies:   Allergies  Allergen Reactions  . Ativan [Lorazepam]      Family History:   Family History  Problem  Relation Age of Onset  . CAD Father   . Stroke Brother   . Alcohol abuse Brother   . CAD Brother     Medications: Nrovasc 5 mg a day  atenolol 50 mg a day HCTZ 12.5 mg a day      Physical Exam: Patient Vitals for the past 24 hrs:  BP Temp Temp src Pulse Resp SpO2 Height Weight  12/23/15 1845 (!) 147/51 - - 69 19 100 % - -  12/23/15 1830 (!) 137/47 - - 68 18 98 % - -  12/23/15 1701 106/90 - - 65 19 100 % - -  12/23/15  1646 126/71 - - 72 24 100 % - -  12/23/15 1612 118/78 - - 66 16 100 % - -  12/23/15 1318 121/61 97.6 F (36.4 C) Oral 67 16 97 % 5' (1.524 m) 49.9 kg (110 lb)    1. General:  in No Acute distress 2. Psychological: Alert and   Oriented to self and situation 3. Head/ENT:  Dry Mucous Membranes                          Head Non traumatic, neck supple                           Poor Dentition 4. SKIN:   decreased Skin turgor,  Skin clean Dry and intact no rash 5. Heart: Regular rate and rhythm no Murmur, Rub or gallop 6. Lungs: Clear to auscultation bilaterally, no wheezes or crackles   7. Abdomen: Soft,  In RUQ tender, Non distended 8. Lower extremities: no clubbing, cyanosis, or edema 9. Neurologically   strength 5 out of 5 in all 4 extremities cranial nerves II through XII intact 10. MSK: Normal range of motion   body mass index is 21.48 kg/m.  Labs on Admission:   Labs on Admission: I have personally reviewed following labs and imaging studies  CBC:  Recent Labs Lab 12/23/15 1332  WBC 6.2  HGB 13.9  HCT 36.8  MCV 82.7  PLT 184   Basic Metabolic Panel:  Recent Labs Lab 12/23/15 1332  NA 124*  K 3.5  CL 86*  CO2 28  GLUCOSE 121*  BUN 7  CREATININE 0.69  CALCIUM 9.3   GFR: Estimated Creatinine Clearance (by C-G formula based on SCr of 0.69 mg/dL) Female: 13.040.3 mL/min Female: 52 mL/min Liver Function Tests:  Recent Labs Lab 12/23/15 1332  AST 27  ALT 15  ALKPHOS 62  BILITOT 1.3*  PROT 6.4*  ALBUMIN 4.1    Recent Labs Lab 12/23/15 1332  LIPASE 47   No results for input(s): AMMONIA in the last 168 hours. Coagulation Profile: No results for input(s): INR, PROTIME in the last 168 hours. Cardiac Enzymes: No results for input(s): CKTOTAL, CKMB, CKMBINDEX, TROPONINI in the last 168 hours. BNP (last 3 results) No results for input(s): PROBNP in the last 8760 hours. HbA1C: No results for input(s): HGBA1C in the last 72 hours. CBG: No results for  input(s): GLUCAP in the last 168 hours. Lipid Profile: No results for input(s): CHOL, HDL, LDLCALC, TRIG, CHOLHDL, LDLDIRECT in the last 72 hours. Thyroid Function Tests: No results for input(s): TSH, T4TOTAL, FREET4, T3FREE, THYROIDAB in the last 72 hours. Anemia Panel: No results for input(s): VITAMINB12, FOLATE, FERRITIN, TIBC, IRON, RETICCTPCT in the last 72 hours. Urine analysis:    Component Value Date/Time   COLORURINE YELLOW  12/23/2015 1607   APPEARANCEUR CLOUDY (A) 12/23/2015 1607   LABSPEC 1.025 12/23/2015 1607   PHURINE 6.5 12/23/2015 1607   GLUCOSEU NEGATIVE 12/23/2015 1607   HGBUR NEGATIVE 12/23/2015 1607   BILIRUBINUR SMALL (A) 12/23/2015 1607   KETONESUR 15 (A) 12/23/2015 1607   PROTEINUR 30 (A) 12/23/2015 1607   NITRITE POSITIVE (A) 12/23/2015 1607   LEUKOCYTESUR TRACE (A) 12/23/2015 1607   Sepsis Labs: @LABRCNTIP (procalcitonin:4,lacticidven:4) )No results found for this or any previous visit (from the past 240 hour(s)).    UA  evidence of UTI    No results found for: HGBA1C  Estimated Creatinine Clearance (by C-G formula based on SCr of 0.69 mg/dL) Female: 40.940.3 mL/min Female: 52 mL/min  BNP (last 3 results) No results for input(s): PROBNP in the last 8760 hours.   ECG REPORT Not obtained  Filed Weights   12/23/15 1318  Weight: 49.9 kg (110 lb)     Cultures: No results found for: SDES, SPECREQUEST, CULT, REPTSTATUS   Radiological Exams on Admission: Koreas Abdomen Complete  Result Date: 12/23/2015 CLINICAL DATA:  82 year old female with abdominal pain, nausea and vomiting for 2 weeks. EXAM: ABDOMEN ULTRASOUND COMPLETE COMPARISON:  None. FINDINGS: Gallbladder: Mobile gallstones are identified, the largest measuring 1.3 cm. There is no evidence of gallbladder wall thickening, pericholecystic fluid or sonographic Murphy's sign. Common bile duct: Diameter: 5 mm. There is no evidence of intrahepatic or extrahepatic biliary dilatation. Liver: No focal lesion  identified. Within normal limits in parenchymal echogenicity. IVC: No abnormality visualized. Pancreas: Visualized portion unremarkable. Spleen: Size and appearance within normal limits. Right Kidney: Length: 10.6 cm. Cortical thinning noted. Echogenicity within normal limits. No mass or hydronephrosis visualized. Left Kidney: Length: 10 cm. Cortical thinning noted. 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 Echogenicity within normal limits. No mass or hydronephrosis visualized. Abdominal aorta: No aneurysm visualized. Abdominal aortic atherosclerotic calcifications are present. Other findings: None. IMPRESSION: No evidence of acute abnormality. Cholelithiasis without CT evidence of acute cholecystitis. No biliary dilatation. Abdominal aortic atherosclerosis. Electronically Signed   By: Harmon PierJeffrey  Hu M.D.   On: 12/23/2015 18:02    Chart has been reviewed    Assessment/Plan  82 y.o. female with medical history significant of HTN osteoporosis, GERD and admitted for dehydration with hyponatremia and UTI with acute encephalopathy     Present on Admission: . Acute encephalopathy most likely in the setting of hyponatremia and dehydration as well as contributory UTI Will treat underlying medical causes and monitor neurologically intact . UTI (urinary tract infection) treated with Rocephin await results of urine culture . Hyponatremia - - likely secondary to dehydration, will give IVF, check Urine Na, Cr, Osmolarity. Monitor Na levels to avoid over aggressive correction. Check TSH. Stop offending medications (HCTZ). If no improvement with IVF will initiate further work up for SIADH if appropriate.   . Dehydration secondary to persistent nausea will administer IV fluids Abdominal discomfort with weight loss and persistent nausea right upper quadrant ultrasound only showing gallstones no evidence of cholecystitis. We'll obtain CT of abdomen given  advanced age and risk factors and associated symptoms of weight loss  Other plan as per orders.  DVT prophylaxis:    Lovenox     Code Status: DNR/DNI   as per patient    Family Communication:   Family not  at  Bedside    Disposition Plan:     To home once workup is complete and patient is stable  Would benefit from PT/OT eval prior to DC  ordered                                             Consults called:  none    Admission status:  obs   Level of care    Med surge       I have spent a total of 56 min on this admission   Collins Dimaria 12/23/2015, 7:55 PM    [facility]  Pager [phone]   after 2 AM please page floor coverage PA If 7AM-7PM, please contact the day team taking care of the patient  [email or web address]  Password [email or web address]

## 2015-12-23 NOTE — Progress Notes (Signed)
NURSING PROGRESS NOTE  Ardith DarkJenie S Sudberry [id number] Admission Data: 12/23/2015 11:45 PM Attending Provider: Therisa DoyneAnastassia Doutova, MD NWG:[id number]:[facility] Code Status: DNR  Allergies:  Ativan [lorazepam] Past Medical History:   has a past medical history of Hypertension. Past Surgical History:   has no past surgical history on file. Social History:   reports that she has never smoked. She has never used smokeless tobacco. She reports that she does not drink alcohol or use drugs.  Ardith DarkJenie S Gassett is a 82 y.o. female patient admitted from ED:   Last Documented Vital Signs: Blood pressure (!) 130/38, pulse 67, temperature 99.5 F (37.5 C), temperature source Oral, resp. rate 16, height 5' (1.524 m), weight 48.4 kg (106 lb 9.6 oz), SpO2 97 %.  IV Fluids:  IV in place, occlusive dsg intact without redness, IV cath hand left, condition patent and no redness normal saline.   Skin: Appropriate for ethnicity with bruising on left leg and elbow.  Patch of dry skin on right upper back.  Patient orientated to room. Information packet given to patient. Admission inpatient armband information verified with patient to include name and date of birth. Side rails up x 2, fall assessment and education completed with patient. Patient able to verbalize understanding of risk associated with falls and verbalized understanding to call for assistance before getting out of bed. Call light within reach. Patient able to voice and demonstrate understanding of unit orientation instructions.    Will continue to evaluate and treat per MD orders.  Sue LushKaelin Romesberg RN, BSN

## 2015-12-24 DIAGNOSIS — I10 Essential (primary) hypertension: Secondary | ICD-10-CM | POA: Diagnosis present

## 2015-12-24 DIAGNOSIS — Z811 Family history of alcohol abuse and dependence: Secondary | ICD-10-CM | POA: Diagnosis not present

## 2015-12-24 DIAGNOSIS — E860 Dehydration: Secondary | ICD-10-CM | POA: Diagnosis present

## 2015-12-24 DIAGNOSIS — G9340 Encephalopathy, unspecified: Secondary | ICD-10-CM | POA: Diagnosis not present

## 2015-12-24 DIAGNOSIS — K8020 Calculus of gallbladder without cholecystitis without obstruction: Secondary | ICD-10-CM | POA: Diagnosis present

## 2015-12-24 DIAGNOSIS — G9341 Metabolic encephalopathy: Secondary | ICD-10-CM | POA: Diagnosis present

## 2015-12-24 DIAGNOSIS — N3000 Acute cystitis without hematuria: Secondary | ICD-10-CM | POA: Diagnosis not present

## 2015-12-24 DIAGNOSIS — Z8249 Family history of ischemic heart disease and other diseases of the circulatory system: Secondary | ICD-10-CM | POA: Diagnosis not present

## 2015-12-24 DIAGNOSIS — E876 Hypokalemia: Secondary | ICD-10-CM | POA: Diagnosis present

## 2015-12-24 DIAGNOSIS — Z66 Do not resuscitate: Secondary | ICD-10-CM | POA: Diagnosis present

## 2015-12-24 DIAGNOSIS — E871 Hypo-osmolality and hyponatremia: Secondary | ICD-10-CM | POA: Diagnosis present

## 2015-12-24 DIAGNOSIS — M810 Age-related osteoporosis without current pathological fracture: Secondary | ICD-10-CM | POA: Diagnosis present

## 2015-12-24 DIAGNOSIS — Z823 Family history of stroke: Secondary | ICD-10-CM | POA: Diagnosis not present

## 2015-12-24 DIAGNOSIS — K219 Gastro-esophageal reflux disease without esophagitis: Secondary | ICD-10-CM | POA: Diagnosis present

## 2015-12-24 DIAGNOSIS — R1011 Right upper quadrant pain: Secondary | ICD-10-CM | POA: Diagnosis present

## 2015-12-24 DIAGNOSIS — N390 Urinary tract infection, site not specified: Secondary | ICD-10-CM | POA: Diagnosis present

## 2015-12-24 LAB — BASIC METABOLIC PANEL
Anion gap: 8 (ref 5–15)
Anion gap: 8 (ref 5–15)
BUN: 7 mg/dL (ref 6–20)
BUN: 7 mg/dL (ref 6–20)
CHLORIDE: 98 mmol/L — AB (ref 101–111)
CHLORIDE: 100 mmol/L — AB (ref 101–111)
CO2: 26 mmol/L (ref 22–32)
CO2: 27 mmol/L (ref 22–32)
CREATININE: 0.63 mg/dL (ref 0.44–1.00)
CREATININE: 0.66 mg/dL (ref 0.44–1.00)
Calcium: 8.4 mg/dL — ABNORMAL LOW (ref 8.9–10.3)
Calcium: 8.9 mg/dL (ref 8.9–10.3)
GFR calc Af Amer: >60 mL/min (ref >60)
GFR calc Af Amer: >60 mL/min (ref >60)
GFR calc non Af Amer: >60 mL/min (ref >60)
GFR calc non Af Amer: >60 mL/min (ref >60)
GLUCOSE: 114 mg/dL — AB (ref 65–99)
GLUCOSE: 121 mg/dL — AB (ref 65–99)
POTASSIUM: 3.5 mmol/L (ref 3.5–5.1)
POTASSIUM: 3.3 mmol/L — AB (ref 3.5–5.1)
SODIUM: 132 mmol/L — AB (ref 135–145)
SODIUM: 135 mmol/L (ref 135–145)

## 2015-12-24 LAB — COMPREHENSIVE METABOLIC PANEL
ALBUMIN: 3.2 g/dL — AB (ref 3.5–5.0)
ALK PHOS: 45 U/L (ref 38–126)
ALT: 11 U/L — AB (ref 14–54)
AST: 21 U/L (ref 15–41)
Anion gap: 10 (ref 5–15)
BUN: 5 mg/dL — ABNORMAL LOW (ref 6–20)
CALCIUM: 8.3 mg/dL — AB (ref 8.9–10.3)
CHLORIDE: 99 mmol/L — AB (ref 101–111)
CO2: 24 mmol/L (ref 22–32)
CREATININE: 0.63 mg/dL (ref 0.44–1.00)
GFR calc Af Amer: >60 mL/min (ref >60)
GFR calc non Af Amer: >60 mL/min (ref >60)
GLUCOSE: 101 mg/dL — AB (ref 65–99)
Potassium: 2.9 mmol/L — ABNORMAL LOW (ref 3.5–5.1)
SODIUM: 133 mmol/L — AB (ref 135–145)
Total Bilirubin: 1 mg/dL (ref 0.3–1.2)
Total Protein: 5.3 g/dL — ABNORMAL LOW (ref 6.5–8.1)

## 2015-12-24 LAB — CBC
HCT: 31.9 % — ABNORMAL LOW (ref 36.0–46.0)
HEMOGLOBIN: 11.5 g/dL — AB (ref 12.0–15.0)
MCH: 30.5 pg (ref 26.0–34.0)
MCHC: 36.1 g/dL — AB (ref 30.0–36.0)
MCV: 84.6 fL (ref 78.0–100.0)
PLATELETS: 147 10*3/uL — AB (ref 150–400)
RBC: 3.77 MIL/uL — ABNORMAL LOW (ref 3.87–5.11)
RDW: 12.5 % (ref 11.5–15.5)
WBC: 5.3 10*3/uL (ref 4.0–10.5)

## 2015-12-24 LAB — TSH: TSH: 2.824 u[IU]/mL (ref 0.350–4.500)

## 2015-12-24 LAB — PHOSPHORUS: PHOSPHORUS: 3.1 mg/dL (ref 2.5–4.6)

## 2015-12-24 LAB — OSMOLALITY, URINE: OSMOLALITY UR: 340 mosm/kg (ref 300–900)

## 2015-12-24 LAB — MAGNESIUM: MAGNESIUM: 2 mg/dL (ref 1.7–2.4)

## 2015-12-24 LAB — CREATININE, URINE, RANDOM: Creatinine, Urine: 63.85 mg/dL

## 2015-12-24 LAB — SODIUM, URINE, RANDOM: Sodium, Ur: 71 mmol/L

## 2015-12-24 MED ORDER — POTASSIUM CHLORIDE CRYS ER 20 MEQ PO TBCR
40.0000 meq | EXTENDED_RELEASE_TABLET | ORAL | Status: AC
  Administered 2015-12-24: 40 meq via ORAL
  Filled 2015-12-24: qty 2

## 2015-12-24 NOTE — Evaluation (Signed)
Physical Therapy Evaluation Patient Details Name: [name] MRN: [id number] DOB: 11/27/1933 Today's Date: 12/24/2015   History of Present Illness  82 y.o. female with medical history significant of HTN osteoporosis, GERD. Presented with 2 week history of nausea and increased confusion.   Clinical Impression  Pt is working with PT well and noted her gait to be broad based, may need some assist at home.  Pt states daughter can help her, and will allow transition to home with that caveat.  If not will be a SNF candidate.  Follow acutely for stair training, for strengthening and safety with balance and gait.    Follow Up Recommendations Home health PT;Supervision/Assistance - 24 hour    Equipment Recommendations  Rolling walker with 5" wheels (if her walker is unsafe)    Recommendations for Other Services       Precautions / Restrictions Precautions Precautions: Fall Restrictions Weight Bearing Restrictions: No      Mobility  Bed Mobility               General bed mobility comments: up to side of bed  Transfers Overall transfer level: Needs assistance Equipment used: None Transfers: Sit to/from Stand;Stand Pivot Transfers Sit to Stand: Supervision Stand pivot transfers: Min guard       General transfer comment: No complaints of light headed feeling, no LOB  Ambulation/Gait Ambulation/Gait assistance: Min guard;Min assist Ambulation Distance (Feet): 190 Feet Assistive device: 1 person hand held assist Gait Pattern/deviations: Step-through pattern;Decreased stride length;Wide base of support;Trunk flexed (nearly waddling initially) Gait velocity: reduced Gait velocity interpretation: Below normal speed for age/gender    Stairs            Wheelchair Mobility    Modified Rankin (Stroke Patients Only)       Balance Overall balance assessment: Needs assistance Sitting-balance support: Feet supported Sitting balance-Leahy Scale: Good     Standing  balance support: Single extremity supported Standing balance-Leahy Scale: Fair Standing balance comment: less than fair dynamic standing balance                             Pertinent Vitals/Pain Pain Assessment: No/denies pain    Home Living Family/patient expects to be discharged to:: Private residence Living Arrangements: Alone Available Help at Discharge: Family;Available PRN/intermittently Type of Home: House Home Access: Stairs to enter Entrance Stairs-Rails: Right;Left;Can reach both Entrance Stairs-Number of Steps: 4 Home Layout: One level Home Equipment: Walker - 2 wheels;Gilmer MorCane - single point Additional Comments: May go to daughters house for a few days upon d/c.    Prior Function Level of Independence: Independent with assistive device(s)         Comments: Pt reports she occasionally uses a cane/RW. Independent with all ADL.     Hand Dominance   Dominant Hand: Right    Extremity/Trunk Assessment   Upper Extremity Assessment: Overall WFL for tasks assessed           Lower Extremity Assessment: Overall WFL for tasks assessed      Cervical / Trunk Assessment: Normal  Communication   Communication: No difficulties  Cognition Arousal/Alertness: Awake/alert Behavior During Therapy: WFL for tasks assessed/performed Overall Cognitive Status: Within Functional Limits for tasks assessed                      General Comments General comments (skin integrity, edema, etc.): Pt is unaware of her safety with gait, noted wide base and  pt not concerned she is losing balance on hallway    Exercises     Assessment/Plan    PT Assessment Patient needs continued PT services  PT Problem List Decreased strength;Decreased range of motion;Decreased activity tolerance;Decreased balance;Decreased mobility;Decreased coordination;Decreased cognition;Decreased knowledge of use of DME;Decreased safety awareness;Decreased knowledge of  precautions;Cardiopulmonary status limiting activity          PT Treatment Interventions DME instruction;Gait training;Functional mobility training;Therapeutic activities;Therapeutic exercise;Balance training;Neuromuscular re-education;Patient/family education    PT Goals (Current goals can be found in the Care Plan section)  Acute Rehab PT Goals Patient Stated Goal: return home PT Goal Formulation: With patient Time For Goal Achievement: 01/07/16 Potential to Achieve Goals: Good    Frequency Min 3X/week   Barriers to discharge Decreased caregiver support;Inaccessible home environment home alone    Co-evaluation               End of Session Equipment Utilized During Treatment: Gait belt Activity Tolerance: Patient tolerated treatment well;Patient limited by fatigue Patient left: with call bell/phone within reach;in chair Nurse Communication: Mobility status    Functional Assessment Tool Used: clinical judgment Functional Limitation: Mobility: Walking and moving around Mobility: Walking and Moving Around Current Status (O1308(G8978): At least 20 percent but less than 40 percent impaired, limited or restricted Mobility: Walking and Moving Around Goal Status [phone](G8979): At least 1 percent but less than 20 percent impaired, limited or restricted    Time: 1031-1058 PT Time Calculation (min) (ACUTE ONLY): 27 min   Charges:   PT Evaluation $PT Eval Moderate Complexity: 1 Procedure PT Treatments $Gait Training: 8-22 mins   PT G Codes:   PT G-Codes **NOT FOR INPATIENT CLASS** Functional Assessment Tool Used: clinical judgment Functional Limitation: Mobility: Walking and moving around Mobility: Walking and Moving Around Current Status ([id number](G8978): At least 20 percent but less than 40 percent impaired, limited or restricted Mobility: Walking and Moving Around Goal Status [phone](G8979): At least 1 percent but less than 20 percent impaired, limited or restricted    Ivar DrapeStout, Avielle Imbert E 12/24/2015,  1:38 PM    Samul Dadauth Theodoros Stjames, PT MS Acute Rehab Dept. Number: [facility] [phone] and [facility] [phone]

## 2015-12-24 NOTE — Progress Notes (Signed)
PROGRESS NOTE        PATIENT DETAILS Name: [name] Age: 82 y.o. Sex: female Date of Birth: 02/09/1933 Admit Date: 12/23/2015 Admitting Physician Therisa Doyne, MD ZOX:[id number]  Brief Narrative: Patient is a 82 y.o. female with prior history of hypertension, admitted with confusion and 2 week history of ongoing nausea and vomiting. She was found to have hyponatremia, hypokalemia and admitted to the hospitalist service for further evaluation and treatment.  Subjective: She is awake and alert this morning. She just ate breakfast before I saw her and did not have any vomiting. She claims her last vomiting episode was yesterday afternoon before she came to the hospital.  Assessment/Plan: Acute metabolic encephalopathy: Likely multifactorial from dehydration, mild hyponatremia and perhaps UTI. Her encephalopathy seems to have resolved, she is awake and alert and able to provide significant amount of history to me this morning.  Vomiting: Seems to have resolved.? Etiology. Per patient, she's had several episodes of vomiting spells over the past few years that would get better after she got a "shot" at her doctor's office. Her abdominal exam is completely benign without any right upper quadrant tenderness this morning. CT of the abdomen does not show any acute abnormalities-it does show cholelithiasis-which was also confirmed on abdominal ultrasound. LFTs and lipase levels are within normal limits. Plans are to advance diet and monitor for another 24 hours. If vomiting does not recur, suspect she could be discharged-she could pursue surgical evaluation for cholelithiasis as an outpatient.  Hyponatremia: Secondary to dehydration, significantly improved with IV fluids. Follow electrolytes periodically  Hypokalemia: Secondary to GI loss/vomiting and HCTZ use, replete and recheck.  Dehydration: Now euvolemic-saline lock all IV  fluids, encourage oral intake.  ? UTI: UA is actually somewhat equivocal-not sure if this is the cause of her vomiting. Continue Rocephin, await urine culture results.  Hypertension: Controlled, continue with amlodipine and atenolol. Continue to hold HCTZ.  DVT Prophylaxis: Prophylactic Lovenox   Code Status: Full code   Family Communication: None at bedside  Disposition Plan: Remain inpatient-home tomorrow IF clinical improvement continues  Antimicrobial agents: See below  Procedures: None  CONSULTS:  None  Time spent: 25 minutes-Greater than 50% of this time was spent in counseling, explanation of diagnosis, planning of further management, and coordination of care.  MEDICATIONS: Anti-infectives    Start     Dose/Rate Route Frequency Ordered Stop   12/24/15 1800  cefTRIAXone (ROCEPHIN) 1 g in dextrose 5 % 50 mL IVPB     1 g 100 mL/hr over 30 Minutes Intravenous Every 24 hours 12/23/15 2112     12/23/15 1815  cefTRIAXone (ROCEPHIN) 1 g in dextrose 5 % 50 mL IVPB     1 g 100 mL/hr over 30 Minutes Intravenous  Once 12/23/15 1808 12/23/15 1931      Scheduled Meds: . amLODipine  5 mg Oral Daily  . aspirin EC  81 mg Oral Daily  . atenolol  50 mg Oral Daily  . cefTRIAXone (ROCEPHIN)  IV  1 g Intravenous Q24H  . enoxaparin (LOVENOX) injection  40 mg Subcutaneous Q24H  . metoCLOPramide (REGLAN) injection  5 mg Intravenous Q12H  . potassium chloride  40 mEq Oral Q4H  . senna  1 tablet Oral BID  . thiamine  100 mg Oral Daily   Continuous Infusions: PRN Meds:.acetaminophen **OR**  acetaminophen, bisacodyl, ondansetron **OR** ondansetron (ZOFRAN) IV, polyethylene glycol   PHYSICAL EXAM: Vital signs: Vitals:   12/24/15 0539 12/24/15 1217 12/24/15 1220 12/24/15 1222  BP: (!) 133/39 (!) 112/42 (!) 108/46 (!) 103/39  Pulse: 75 67 67 69  Resp: 18     Temp: 99.1 F (37.3 C)     TempSrc: Oral     SpO2: 96% 96% 99% 96%  Weight:      Height:       Filed Weights    12/23/15 1318 12/23/15 2105  Weight: 49.9 kg (110 lb) 48.4 kg (106 lb 9.6 oz)   Body mass index is 20.82 kg/m.   General appearance :Awake, alert, not in any distress. Speech Clear. Not toxic Looking Eyes:, pupils equally reactive to light and accomodation,no scleral icterus.Pink conjunctiva HEENT: Atraumatic and Normocephalic Neck: supple, no JVD. No cervical lymphadenopathy. No thyromegaly Resp:Good air entry bilaterally CVS: S1 S2 regular, no murmurs.  GI: Bowel sounds present, Non tender and not distended with no gaurding, rigidity or rebound.No organomegaly Extremities: B/L Lower Ext shows no edema, both legs are warm to touch Neurology:  speech clear,Non focal, sensation is grossly intact. Psychiatric: Normal judgment and insight. Alert and oriented x 3. Normal mood. Musculoskeletal:gait appears to be normal.No digital cyanosis Skin:No Rash, warm and dry Wounds:N/A  I have personally reviewed following labs and imaging studies  LABORATORY DATA: CBC:  Recent Labs Lab 12/23/15 1332 12/24/15 0516  WBC 6.2 5.3  HGB 13.9 11.5*  HCT 36.8 31.9*  MCV 82.7 84.6  PLT 184 147*    Basic Metabolic Panel:  Recent Labs Lab 12/23/15 1332 12/23/15 2153 12/24/15 0516  NA 124* 126* 133*  K 3.5 3.3* 2.9*  CL 86* 90* 99*  CO2 28 26 24   GLUCOSE 121* 107* 101*  BUN 7 5* 5*  CREATININE 0.69 0.68 0.63  CALCIUM 9.3 8.4* 8.3*  MG  --   --  2.0  PHOS  --   --  3.1    GFR: Estimated Creatinine Clearance (by C-G formula based on SCr of 0.63 mg/dL) Female: 40.940.3 mL/min Female: 50.4 mL/min  Liver Function Tests:  Recent Labs Lab 12/23/15 1332 12/24/15 0516  AST 27 21  ALT 15 11*  ALKPHOS 62 45  BILITOT 1.3* 1.0  PROT 6.4* 5.3*  ALBUMIN 4.1 3.2*    Recent Labs Lab 12/23/15 1332  LIPASE 47   No results for input(s): AMMONIA in the last 168 hours.  Coagulation Profile: No results for input(s): INR, PROTIME in the last 168 hours.  Cardiac Enzymes: No results for  input(s): CKTOTAL, CKMB, CKMBINDEX, TROPONINI in the last 168 hours.  BNP (last 3 results) No results for input(s): PROBNP in the last 8760 hours.  HbA1C: No results for input(s): HGBA1C in the last 72 hours.  CBG: No results for input(s): GLUCAP in the last 168 hours.  Lipid Profile: No results for input(s): CHOL, HDL, LDLCALC, TRIG, CHOLHDL, LDLDIRECT in the last 72 hours.  Thyroid Function Tests:  Recent Labs  12/24/15 0516  TSH 2.824    Anemia Panel: No results for input(s): VITAMINB12, FOLATE, FERRITIN, TIBC, IRON, RETICCTPCT in the last 72 hours.  Urine analysis:    Component Value Date/Time   COLORURINE YELLOW 12/23/2015 1607   APPEARANCEUR CLOUDY (A) 12/23/2015 1607   LABSPEC 1.025 12/23/2015 1607   PHURINE 6.5 12/23/2015 1607   GLUCOSEU NEGATIVE 12/23/2015 1607   HGBUR NEGATIVE 12/23/2015 1607   BILIRUBINUR SMALL (A) 12/23/2015 1607   KETONESUR 15 (  A) 12/23/2015 1607   PROTEINUR 30 (A) 12/23/2015 1607   NITRITE POSITIVE (A) 12/23/2015 1607   LEUKOCYTESUR TRACE (A) 12/23/2015 1607    Sepsis Labs: Lactic Acid, Venous No results found for: LATICACIDVEN  MICROBIOLOGY: No results found for this or any previous visit (from the past 240 hour(s)).  RADIOLOGY STUDIES/RESULTS: [place] Abdomen Complete  Result Date: 12/23/2015 CLINICAL DATA:  82 year old female with abdominal pain, nausea and vomiting for 2 weeks. EXAM: ABDOMEN ULTRASOUND COMPLETE COMPARISON:  None. FINDINGS: Gallbladder: Mobile gallstones are identified, the largest measuring 1.3 cm. There is no evidence of gallbladder wall thickening, pericholecystic fluid or sonographic Murphy's sign. Common bile duct: Diameter: 5 mm. There is no evidence of intrahepatic or extrahepatic biliary dilatation. Liver: No focal lesion identified. Within normal limits in parenchymal echogenicity. IVC: No abnormality visualized. Pancreas: Visualized portion unremarkable. Spleen: Size and appearance within normal limits. Right  Kidney: Length: 10.6 cm. Cortical thinning noted. Echogenicity within normal limits. No mass or hydronephrosis visualized. Left Kidney: Length: 10 cm. Cortical thinning noted. 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 Echogenicity within normal limits. No mass or hydronephrosis visualized. Abdominal aorta: No aneurysm visualized. Abdominal aortic atherosclerotic calcifications are present. Other findings: None. IMPRESSION: No evidence of acute abnormality. Cholelithiasis without CT evidence of acute cholecystitis. No biliary dilatation. Abdominal aortic atherosclerosis. Electronically Signed   By: Harmon PierJeffrey  Hu M.D.   On: 12/23/2015 18:02   Ct Abdomen Pelvis W Contrast  Result Date: 12/23/2015 CLINICAL DATA:  82 year old female with right flank and abdominal pain and nausea for 2 weeks. EXAM: CT ABDOMEN AND PELVIS WITH CONTRAST TECHNIQUE: Multidetector CT imaging of the abdomen and pelvis was performed using the standard protocol following bolus administration of intravenous contrast. CONTRAST:  100 cc intravenous Isovue-300 COMPARISON:  12/23/2015 abdominal ultrasound FINDINGS: Lower chest: Cardiomegaly noted.  No acute abnormalities identified. Hepatobiliary: No significant hepatic abnormalities are identified. Cholelithiasis noted without CT evidence of acute cholecystitis. Pancreas: Unremarkable Spleen: Unremarkable Adrenals/Urinary Tract: The kidneys, adrenal glands and bladder are unremarkable except for small renal cysts. Stomach/Bowel: There is no evidence of bowel obstruction or definite bowel wall thickening. The appendix appears normal. Vascular/Lymphatic: Abdominal aortic atherosclerotic calcifications noted without aneurysm. No enlarged lymph nodes identified. Reproductive: Bilateral hip replacements obscures detail on the pelvis. No definite abnormality. Other: No free fluid, pneumoperitoneum or abscess. Musculoskeletal: A 20% compression  fracture of the L4 superior endplate is age indeterminate. No other acute or suspicious abnormalities identified. IMPRESSION: Age indeterminate 20% compression fracture of the L4 superior endplate, probably remote but correlate with pain. No acute abnormality within the abdomen or pelvis. Cholelithiasis without CT evidence of acute cholecystitis. Abdominal aortic atherosclerosis. Electronically Signed   By: Harmon PierJeffrey  Hu M.D.   On: 12/23/2015 21:20     LOS: 0 days   Jeoffrey MassedGHIMIRE,SHANKER, MD  [facility] Pager:[phone]  If 7PM-7AM, please contact night-coverage [email or web address] Password [email or web address] 12/24/2015, 1:15 PM

## 2015-12-24 NOTE — Care Management Obs Status (Signed)
MEDICARE OBSERVATION STATUS NOTIFICATION   Patient Details  Name: [name] MRN: [id number] Date of Birth: 08/10/1933   Medicare Observation Status Notification Given:  Yes    Lawerance Sabalebbie Keyia Moretto, RN 12/24/2015, 2:08 PM

## 2015-12-24 NOTE — Evaluation (Signed)
Occupational Therapy Evaluation Patient Details Name: Kristen DarkJenie S [name] MRN: [id number] DOB: 09/08/1933 Today's Date: 12/24/2015    History of Present Illness 82 y.o. female with medical history significant of HTN osteoporosis, GERD. Presented with 2 week history of nausea and increased confusion.    Clinical Impression   Pt reports she was independent with ADL PTA. Currently pt overall supervision for ADL and functional mobility. Pt reports she feels as if she has returned to her functional baseline but just feels generally tired and weak. Pt planning to stay with her daughter for a few days prior to returning home alone. No further acute OT needs identified; signing off at this time. Please re-consult if needs change. Thank you for this referral.    Follow Up Recommendations  No OT follow up    Equipment Recommendations  None recommended by OT    Recommendations for Other Services       Precautions / Restrictions Precautions Precautions: Fall Restrictions Weight Bearing Restrictions: No      Mobility Bed Mobility               General bed mobility comments: Pt sitting EOB upon arrival  Transfers Overall transfer level: Needs assistance Equipment used: None Transfers: Sit to/from Stand Sit to Stand: Supervision         General transfer comment: Supervision for safety. No LOB or dizziness reported    Balance Overall balance assessment: No apparent balance deficits (not formally assessed)                                          ADL Overall ADL's : Needs assistance/impaired Eating/Feeding: Independent;Sitting   Grooming: Supervision/safety;Standing   Upper Body Bathing: Supervision/ safety;Standing   Lower Body Bathing: Supervison/ safety   Upper Body Dressing : Supervision/safety   Lower Body Dressing: Supervision/safety;Sit to/from stand   Toilet Transfer: Supervision/safety;Ambulation;Regular Teacher, adult educationToilet Toilet Transfer Details  (indicate cue type and reason): Simulated by sit to stand from EOB with functional mobility Toileting- Clothing Manipulation and Hygiene: Supervision/safety   Tub/ Shower Transfer: Supervision/safety;Tub transfer;Ambulation   Functional mobility during ADLs: Supervision/safety General ADL Comments: Pt able to perform higher level balance activities without unsteadiness or LOB. Pt reports she feels she has returned to baseline functionally but just feels overall tired.      Vision Vision Assessment?: No apparent visual deficits   Perception     Praxis      Pertinent Vitals/Pain Pain Assessment: No/denies pain     Hand Dominance Right   Extremity/Trunk Assessment Upper Extremity Assessment Upper Extremity Assessment: Overall WFL for tasks assessed   Lower Extremity Assessment Lower Extremity Assessment: Defer to PT evaluation   Cervical / Trunk Assessment Cervical / Trunk Assessment: Normal   Communication Communication Communication: No difficulties   Cognition Arousal/Alertness: Awake/alert Behavior During Therapy: WFL for tasks assessed/performed Overall Cognitive Status: Within Functional Limits for tasks assessed                     General Comments       Exercises       Shoulder Instructions      Home Living Family/patient expects to be discharged to:: Private residence Living Arrangements: Alone Available Help at Discharge: Family;Available PRN/intermittently Type of Home: House Home Access: Stairs to enter Entrance Stairs-Number of Steps: 4-5   Home Layout: One level     Bathroom  Shower/Tub: Tub/shower unit Shower/tub characteristics: Engineer, building servicesCurtain Bathroom Toilet: Standard     Home Equipment: Environmental consultantWalker - 2 wheels;Gilmer MorCane - single point   Additional Comments: May go to daughters house for a few days upon d/c.      Prior Functioning/Environment Level of Independence: Independent with assistive device(s)        Comments: Pt reports she  occasionally uses a cane/RW. Independent with all ADL.        OT Problem List:     OT Treatment/Interventions:      OT Goals(Current goals can be found in the care plan section) Acute Rehab OT Goals Patient Stated Goal: return home OT Goal Formulation: All assessment and education complete, DC therapy  OT Frequency:     Barriers to D/C:            Co-evaluation              End of Session Equipment Utilized During Treatment: Gait belt Nurse Communication: Mobility status  Activity Tolerance: Patient tolerated treatment well Patient left: in bed;with call bell/phone within reach (sitting EOB)   Time: 1131-1145 OT Time Calculation (min): 14 min Charges:  OT General Charges $OT Visit: 1 Procedure OT Evaluation $OT Eval Moderate Complexity: 1 Procedure G-Codes: OT G-codes **NOT FOR INPATIENT CLASS** Functional Assessment Tool Used: Clinical judgement Functional Limitation: Self care Self Care Current Status (A5409(G8987): At least 1 percent but less than 20 percent impaired, limited or restricted Self Care Goal Status (W1191(G8988): At least 1 percent but less than 20 percent impaired, limited or restricted Self Care Discharge Status [phone](G8989): At least 1 percent but less than 20 percent impaired, limited or restricted    Gaye AlkenBailey A Cenia Zaragosa M.S., OTR/L Pager: [phone]  12/24/2015, 11:56 AM

## 2015-12-25 LAB — BASIC METABOLIC PANEL
ANION GAP: 8 (ref 5–15)
BUN: 7 mg/dL (ref 6–20)
CHLORIDE: 102 mmol/L (ref 101–111)
CO2: 25 mmol/L (ref 22–32)
Calcium: 8.7 mg/dL — ABNORMAL LOW (ref 8.9–10.3)
Creatinine, Ser: 0.55 mg/dL (ref 0.44–1.00)
GFR calc non Af Amer: >60 mL/min (ref >60)
Glucose, Bld: 116 mg/dL — ABNORMAL HIGH (ref 65–99)
POTASSIUM: 3.3 mmol/L — AB (ref 3.5–5.1)
SODIUM: 135 mmol/L (ref 135–145)

## 2015-12-25 LAB — URINE CULTURE

## 2015-12-25 MED ORDER — ONDANSETRON HCL 4 MG PO TABS: 4.0000 mg | ORAL_TABLET | Freq: Four times a day (QID) | ORAL | 0 refills | Status: DC | PRN

## 2015-12-25 MED ORDER — POTASSIUM CHLORIDE CRYS ER 20 MEQ PO TBCR
40.0000 meq | EXTENDED_RELEASE_TABLET | Freq: Every day | ORAL | Status: DC
Start: 2015-12-25 — End: 2015-12-25
  Administered 2015-12-25: 40 meq via ORAL
  Filled 2015-12-25: qty 2

## 2015-12-25 NOTE — Progress Notes (Signed)
CM met with pt in room to offer choice of home health agency. Pt chooses [facility] to render HHPT.  Referral called to [facility] rep, Jermaine with physical address: [place], [place].  Pt states she has both a rolling walker and a cane at home and does not need a 3n1.  No other CM needs were communicated.

## 2015-12-25 NOTE — Discharge Summary (Signed)
PATIENT DETAILS Name: [name] Age: 82 y.o. Sex: female Date of Birth: 04/14/1933 MRN: [id number]. Admitting Physician: Therisa DoyneAnastassia Doutova, MD WUJ:[id number]:[facility]  Admit Date: 12/23/2015 Discharge date: 12/25/2015  Recommendations for Outpatient Follow-up:  1. Follow up with PCP in 1-2 weeks 2. Please obtain BMP/CBC in one week 3. Ensure follow up with Gen Surgery-re cholelithiasis  Admitted From:  Home  Disposition: Home with home health services  Home Health: Yes  Equipment/Devices: None  Discharge Condition: Stable  CODE STATUS: FULL CODE  Diet recommendation:  Heart Healthy  Brief Summary: See H&P, Labs, Consult and Test reports for all details in brief, Patient is a 82 y.o. female with prior history of hypertension, admitted with confusion and 2 week history of ongoing nausea and vomiting. She was found to have hyponatremia, hypokalemia and admitted to the hospitalist service for further evaluation and treatment.  Brief Hospital Course: Acute metabolic encephalopathy: Resolved.  Likely multifactorial from dehydration, mild hyponatremia and perhaps UTI.  Vomiting:  resolved, etiology remains unknown-per patient, she's had "vomiting spells" over the past few years. Abdominal exam is completely benign without any tenderness. CT of the abdomen did not show any acute abnormalities, right upper quadrant abdominal ultrasound did not show any acute cholecystitis-it did show cholelithiasis. Her lipase and LFTs were within normal limits. She was managed with supportive care, diet has been advanced to a regular diet which she has tolerated. Her last vomiting episode was just before she was hospitalized. I suspect she is stable to be discharged home today, not sure if vomiting can be attributed to cholelithiasis-have recommended the patient and family seek outpatient general surgery evaluation  Hyponatremia: Secondary to dehydration, resolved  with IV fluids.  periodically  Hypokalemia: Secondary to GI loss/vomiting and HCTZ use, will give one dose of potassium chloride 40 mEq prior to discharge, continue to hold HCTZ-recheck chemistry panel at follow up with PCP.   Dehydration:  secondary to vomiting-managed with IV fluids and is now euvolemic. No further vomiting since admission.   ? UTI: UA is actually somewhat equivocal-not sure if this is the cause of her vomiting. Managed with 3 doses of Rocephin, urine culture shows multiple bacterial morphotypes-do not think she needs repeat urine culture as it will likely be of low yield as the patient already on antibiotics. Does not require further antibiotics on discharge.  Hypertension: Controlled, continue with amlodipine and atenolol. Continue to hold HCTZ.  Note-spoke with patient's daughter over the phone on the day of discharge.  Procedures/Studies: None  Discharge Diagnoses:  Active Problems:   Acute encephalopathy   UTI (urinary tract infection)   Hyponatremia   Dehydration   Discharge Instructions:  Activity:  As tolerated with Full fall precautions use walker/cane & assistance as needed   Discharge Instructions    Diet - low sodium heart healthy    Complete by:  As directed    Discharge instructions    Complete by:  As directed    Follow with Primary MD  [facility] in 1 week  [facility] in 2-3 weeks to evaluate gall stones  Please get a complete blood count and chemistry panel checked by your Primary MD at your next visit, and again as instructed by your Primary MD.  Get Medicines reviewed and adjusted: Please take all your medications with you for your next visit with your Primary MD  Laboratory/radiological data: Please request your Primary MD to go over all hospital  tests and procedure/radiological results at the follow up, please ask your Primary MD to get all Hospital records sent to  his/her office.  In some cases, they will be blood work, cultures and biopsy results pending at the time of your discharge. Please request that your primary care M.D. follows up on these results.  Also Note the following: If you experience worsening of your admission symptoms, develop shortness of breath, life threatening emergency, suicidal or homicidal thoughts you must seek medical attention immediately by calling 911 or calling your MD immediately  if symptoms less severe.  You must read complete instructions/literature along with all the possible adverse reactions/side effects for all the Medicines you take and that have been prescribed to you. Take any new Medicines after you have completely understood and accpet all the possible adverse reactions/side effects.   Do not drive when taking Pain medications or sleeping medications (Benzodaizepines)  Do not take more than prescribed Pain, Sleep and Anxiety Medications. It is not advisable to combine anxiety,sleep and pain medications without talking with your primary care practitioner  Special Instructions: If you have smoked or chewed Tobacco  in the last 2 yrs please stop smoking, stop any regular Alcohol  and or any Recreational drug use.  Wear Seat belts while driving.  Please note: You were cared for by a hospitalist during your hospital stay. Once you are discharged, your primary care physician will handle any further medical issues. Please note that NO REFILLS for any discharge medications will be authorized once you are discharged, as it is imperative that you return to your primary care physician (or establish a relationship with a primary care physician if you do not have one) for your post hospital discharge needs so that they can reassess your need for medications and monitor your lab values.   Increase activity slowly    Complete by:  As directed        Medication List    STOP taking these medications   hydrochlorothiazide  12.5 MG tablet Commonly known as:  HYDRODIURIL     TAKE these medications   amLODipine 5 MG tablet Commonly known as:  NORVASC Take 5 mg by mouth daily.   atenolol 50 MG tablet Commonly known as:  TENORMIN Take 50 mg by mouth daily.   latanoprost 0.005 % ophthalmic solution Commonly known as:  XALATAN Place 1 drop into both eyes at bedtime.   ondansetron 4 MG tablet Commonly known as:  ZOFRAN Take 1 tablet (4 mg total) by mouth every 6 (six) hours as needed for nausea.      Follow-up Information    [facility]. Schedule an appointment as soon as possible for a visit in 1 week(s).   Specialty:  Family Medicine Contact information: [address] [place] [address] [phone]        [facility]. Schedule an appointment as soon as possible for a visit in 2 week(s).   Specialty:  General Surgery Contact information: [address] [place] [address] [phone]          Allergies  Allergen Reactions  . Ativan [Lorazepam]     altered   Consultations:   None     Other Procedures/Studies: US Abdomen Complete  Result Date: 12/23/2015 CLINICAL DATA:  82 year old female with abdominal pain, nausea and vomiting for 2 weeks. EXAM: ABDOMEN ULTRASOUND COMPLETE COMPARISON:  None. FINDINGS: Gallbladder: Mobile gallstones are identified, the largest measuring 1.3 cm. There  is no evidence of gallbladder wall thickening, pericholecystic fluid or sonographic Murphy's sign. Common bile duct: Diameter: 5 mm. There is no evidence of intrahepatic or extrahepatic biliary dilatation. Liver: No focal lesion identified. Within normal limits in parenchymal echogenicity. IVC: No abnormality visualized. Pancreas: Visualized portion unremarkable. Spleen: Size and appearance within normal limits. Right Kidney: Length: 10.6 cm. Cortical thinning noted. Echogenicity within normal limits. No mass or hydronephrosis  visualized. Left Kidney: Length: 10 cm. Cortical thinning noted. 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 Echogenicity within normal limits. No mass or hydronephrosis visualized. Abdominal aorta: No aneurysm visualized. Abdominal aortic atherosclerotic calcifications are present. Other findings: None. IMPRESSION: No evidence of acute abnormality. Cholelithiasis without CT evidence of acute cholecystitis. No biliary dilatation. Abdominal aortic atherosclerosis. Electronically Signed   By: Harmon Pier M.D.   On: 12/23/2015 18:02   Ct Abdomen Pelvis W Contrast  Result Date: 12/23/2015 CLINICAL DATA:  82 year old female with right flank and abdominal pain and nausea for 2 weeks. EXAM: CT ABDOMEN AND PELVIS WITH CONTRAST TECHNIQUE: Multidetector CT imaging of the abdomen and pelvis was performed using the standard protocol following bolus administration of intravenous contrast. CONTRAST:  100 cc intravenous Isovue-300 COMPARISON:  12/23/2015 abdominal ultrasound FINDINGS: Lower chest: Cardiomegaly noted.  No acute abnormalities identified. Hepatobiliary: No significant hepatic abnormalities are identified. Cholelithiasis noted without CT evidence of acute cholecystitis. Pancreas: Unremarkable Spleen: Unremarkable Adrenals/Urinary Tract: The kidneys, adrenal glands and bladder are unremarkable except for small renal cysts. Stomach/Bowel: There is no evidence of bowel obstruction or definite bowel wall thickening. The appendix appears normal. Vascular/Lymphatic: Abdominal aortic atherosclerotic calcifications noted without aneurysm. No enlarged lymph nodes identified. Reproductive: Bilateral hip replacements obscures detail on the pelvis. No definite abnormality. Other: No free fluid, pneumoperitoneum or abscess. Musculoskeletal: A 20% compression fracture of the L4 superior endplate is age indeterminate. No other acute or suspicious abnormalities  identified. IMPRESSION: Age indeterminate 20% compression fracture of the L4 superior endplate, probably remote but correlate with pain. No acute abnormality within the abdomen or pelvis. Cholelithiasis without CT evidence of acute cholecystitis. Abdominal aortic atherosclerosis. Electronically Signed   By: Harmon Pier M.D.   On: 12/23/2015 21:20      TODAY-DAY OF DISCHARGE:  Subjective:   [name] today has no headache,no chest abdominal pain,no new weakness tingling or numbness, feels much better wants to go home today.   Objective:   Blood pressure (!) 119/43, pulse 74, temperature 98.1 F (36.7 C), resp. rate 19, height 5' (1.524 m), weight 48.4 kg (106 lb 9.6 oz), SpO2 96 %.  Intake/Output Summary (Last 24 hours) at 12/25/15 1018 Last data filed at 12/24/15 1722  Gross per 24 hour  Intake               50 ml  Output                0 ml  Net               50 ml   Filed Weights   12/23/15 1318 12/23/15 2105  Weight: 49.9 kg (110 lb) 48.4 kg (106 lb 9.6 oz)    Exam: Awake Alert, Oriented *3, No new F.N deficits, Normal affect .AT,PERRAL Supple Neck,No JVD, No cervical lymphadenopathy appriciated.  Symmetrical Chest wall movement, Good air movement bilaterally, CTAB RRR,No Gallops,Rubs or new Murmurs, No Parasternal Heave +ve B.Sounds, Abd Soft, Non tender, No organomegaly appriciated, No rebound -guarding or rigidity. No  Cyanosis, Clubbing or edema, No new Rash or bruise   PERTINENT RADIOLOGIC STUDIES: US Abdomen Complete  Result Date: 12/23/2015 CLINICAL DATA:  82 year old female with abdominal pain, nausea and vomiting for 2 weeks. EXAM: ABDOMEN ULTRASOUND COMPLETE COMPARISON:  None. FINDINGS: Gallbladder: Mobile gallstones are identified, the largest measuring 1.3 cm. There is no evidence of gallbladder wall thickening, pericholecystic fluid or sonographic Murphy's sign. Common bile duct: Diameter: 5 mm. There is no evidence of intrahepatic or extrahepatic biliary  dilatation. Liver: No focal lesion identified. Within normal limits in parenchymal echogenicity. IVC: No abnormality visualized. Pancreas: Visualized portion unremarkable. Spleen: Size and appearance within normal limits. Right Kidney: Length: 10.6 cm. Cortical thinning noted. Echogenicity within normal limits. No mass or hydronephrosis visualized. Left Kidney: Length: 10 cm. Cortical thinning noted. 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 Echogenicity within normal limits. No mass or hydronephrosis visualized. Abdominal aorta: No aneurysm visualized. Abdominal aortic atherosclerotic calcifications are present. Other findings: None. IMPRESSION: No evidence of acute abnormality. Cholelithiasis without CT evidence of acute cholecystitis. No biliary dilatation. Abdominal aortic atherosclerosis. Electronically Signed   By: Harmon Pier M.D.   On: 12/23/2015 18:02   Ct Abdomen Pelvis W Contrast  Result Date: 12/23/2015 CLINICAL DATA:  82 year old female with right flank and abdominal pain and nausea for 2 weeks. EXAM: CT ABDOMEN AND PELVIS WITH CONTRAST TECHNIQUE: Multidetector CT imaging of the abdomen and pelvis was performed using the standard protocol following bolus administration of intravenous contrast. CONTRAST:  100 cc intravenous Isovue-300 COMPARISON:  12/23/2015 abdominal ultrasound FINDINGS: Lower chest: Cardiomegaly noted.  No acute abnormalities identified. Hepatobiliary: No significant hepatic abnormalities are identified. Cholelithiasis noted without CT evidence of acute cholecystitis. Pancreas: Unremarkable Spleen: Unremarkable Adrenals/Urinary Tract: The kidneys, adrenal glands and bladder are unremarkable except for small renal cysts. Stomach/Bowel: There is no evidence of bowel obstruction or definite bowel wall thickening. The appendix appears normal. Vascular/Lymphatic: Abdominal aortic atherosclerotic calcifications noted without  aneurysm. No enlarged lymph nodes identified. Reproductive: Bilateral hip replacements obscures detail on the pelvis. No definite abnormality. Other: No free fluid, pneumoperitoneum or abscess. Musculoskeletal: A 20% compression fracture of the L4 superior endplate is age indeterminate. No other acute or suspicious abnormalities identified. IMPRESSION: Age indeterminate 20% compression fracture of the L4 superior endplate, probably remote but correlate with pain. No acute abnormality within the abdomen or pelvis. Cholelithiasis without CT evidence of acute cholecystitis. Abdominal aortic atherosclerosis. Electronically Signed   By: Harmon Pier M.D.   On: 12/23/2015 21:20     PERTINENT LAB RESULTS: CBC:  Recent Labs  12/23/15 1332 12/24/15 0516  WBC 6.2 5.3  HGB 13.9 11.5*  HCT 36.8 31.9*  PLT 184 147*   CMET CMP     Component Value Date/Time   NA 135 12/25/2015 0422   K 3.3 (L) 12/25/2015 0422   CL 102 12/25/2015 0422   CO2 25 12/25/2015 0422   GLUCOSE 116 (H) 12/25/2015 0422   BUN 7 12/25/2015 0422   CREATININE 0.55 12/25/2015 0422   CALCIUM 8.7 (L) 12/25/2015 0422   PROT 5.3 (L) 12/24/2015 0516   ALBUMIN 3.2 (L) 12/24/2015 0516   AST 21 12/24/2015 0516   ALT 11 (L) 12/24/2015 0516   ALKPHOS 45 12/24/2015 0516   BILITOT 1.0 12/24/2015 0516   GFRNONAA >60 12/25/2015 0422   GFRAA >60 12/25/2015 0422    GFR Estimated Creatinine Clearance (by C-G formula based on SCr of 0.55 mg/dL) Female: 96.0 mL/min Female:  50.4 mL/min  Recent Labs  12/23/15 1332  LIPASE 47   No results for input(s): CKTOTAL, CKMB, CKMBINDEX, TROPONINI in the last 72 hours. Invalid input(s): POCBNP No results for input(s): DDIMER in the last 72 hours. No results for input(s): HGBA1C in the last 72 hours. No results for input(s): CHOL, HDL, LDLCALC, TRIG, CHOLHDL, LDLDIRECT in the last 72 hours.  Recent Labs  12/24/15 0516  TSH 2.824   No results for input(s): VITAMINB12, FOLATE, FERRITIN, TIBC,  IRON, RETICCTPCT in the last 72 hours. Coags: No results for input(s): INR in the last 72 hours.  Invalid input(s): PT Microbiology: Recent Results (from the past 240 hour(s))  Urine culture     Status: Abnormal   Collection Time: 12/23/15  4:07 PM  Result Value Ref Range Status   Specimen Description URINE, CLEAN CATCH  Final   Special Requests ADDED [phone]  Final   Culture MULTIPLE SPECIES PRESENT, SUGGEST RECOLLECTION (A)  Final   Report Status 12/25/2015 FINAL  Final    FURTHER DISCHARGE INSTRUCTIONS:  Get Medicines reviewed and adjusted: Please take all your medications with you for your next visit with your Primary MD  Laboratory/radiological data: Please request your Primary MD to go over all hospital tests and procedure/radiological results at the follow up, please ask your Primary MD to get all Hospital records sent to his/her office.  In some cases, they will be blood work, cultures and biopsy results pending at the time of your discharge. Please request that your primary care M.D. goes through all the records of your hospital data and follows up on these results.  Also Note the following: If you experience worsening of your admission symptoms, develop shortness of breath, life threatening emergency, suicidal or homicidal thoughts you must seek medical attention immediately by calling 911 or calling your MD immediately  if symptoms less severe.  You must read complete instructions/literature along with all the possible adverse reactions/side effects for all the Medicines you take and that have been prescribed to you. Take any new Medicines after you have completely understood and accpet all the possible adverse reactions/side effects.   Do not drive when taking Pain medications or sleeping medications (Benzodaizepines)  Do not take more than prescribed Pain, Sleep and Anxiety Medications. It is not advisable to combine anxiety,sleep and pain medications without talking  with your primary care practitioner  Special Instructions: If you have smoked or chewed Tobacco  in the last 2 yrs please stop smoking, stop any regular Alcohol  and or any Recreational drug use.  Wear Seat belts while driving.  Please note: You were cared for by a hospitalist during your hospital stay. Once you are discharged, your primary care physician will handle any further medical issues. Please note that NO REFILLS for any discharge medications will be authorized once you are discharged, as it is imperative that you return to your primary care physician (or establish a relationship with a primary care physician if you do not have one) for your post hospital discharge needs so that they can reassess your need for medications and monitor your lab values.  Total Time spent coordinating discharge including counseling, education and face to face time equals  45 minutes.  SignedJeoffrey Massed: Gwynn Chalker 12/25/2015 10:18 AM

## 2015-12-25 NOTE — Progress Notes (Signed)
Pt given discharge instructions, prescriptions, and care notes. Pt verbalized understanding AEB no further questions or concerns at this time. IV was discontinued, no redness, pain, or swelling noted at this time. Pt left the floor via wheelchair with staff in stable condition. 

## 2015-12-28 ENCOUNTER — Encounter (HOSPITAL_COMMUNITY): Payer: Self-pay

## 2015-12-28 ENCOUNTER — Observation Stay (HOSPITAL_COMMUNITY): Payer: Medicare Other

## 2015-12-28 ENCOUNTER — Emergency Department (HOSPITAL_COMMUNITY): Payer: Medicare Other

## 2015-12-28 ENCOUNTER — Inpatient Hospital Stay (HOSPITAL_COMMUNITY)
Admission: EM | Admit: 2015-12-28 | Discharge: 2015-12-30 | DRG: 065 | Disposition: A | Discharge status: Rehabilitation Facility | Payer: Medicare Other | Attending: Internal Medicine | Admitting: Internal Medicine

## 2015-12-28 DIAGNOSIS — D571 Sickle-cell disease without crisis: Secondary | ICD-10-CM | POA: Diagnosis present

## 2015-12-28 DIAGNOSIS — K219 Gastro-esophageal reflux disease without esophagitis: Secondary | ICD-10-CM | POA: Diagnosis present

## 2015-12-28 DIAGNOSIS — R112 Nausea with vomiting, unspecified: Secondary | ICD-10-CM | POA: Diagnosis present

## 2015-12-28 DIAGNOSIS — F419 Anxiety disorder, unspecified: Secondary | ICD-10-CM | POA: Diagnosis present

## 2015-12-28 DIAGNOSIS — R200 Anesthesia of skin: Secondary | ICD-10-CM | POA: Diagnosis present

## 2015-12-28 DIAGNOSIS — G8194 Hemiplegia, unspecified affecting left nondominant side: Secondary | ICD-10-CM | POA: Diagnosis present

## 2015-12-28 DIAGNOSIS — I639 Cerebral infarction, unspecified: Principal | ICD-10-CM

## 2015-12-28 DIAGNOSIS — E1151 Type 2 diabetes mellitus with diabetic peripheral angiopathy without gangrene: Secondary | ICD-10-CM | POA: Diagnosis present

## 2015-12-28 DIAGNOSIS — I10 Essential (primary) hypertension: Secondary | ICD-10-CM | POA: Diagnosis present

## 2015-12-28 DIAGNOSIS — E860 Dehydration: Secondary | ICD-10-CM | POA: Diagnosis present

## 2015-12-28 DIAGNOSIS — Z79899 Other long term (current) drug therapy: Secondary | ICD-10-CM

## 2015-12-28 DIAGNOSIS — Z8249 Family history of ischemic heart disease and other diseases of the circulatory system: Secondary | ICD-10-CM

## 2015-12-28 DIAGNOSIS — Z888 Allergy status to other drugs, medicaments and biological substances status: Secondary | ICD-10-CM

## 2015-12-28 DIAGNOSIS — R531 Weakness: Secondary | ICD-10-CM | POA: Diagnosis present

## 2015-12-28 DIAGNOSIS — E871 Hypo-osmolality and hyponatremia: Secondary | ICD-10-CM | POA: Diagnosis present

## 2015-12-28 DIAGNOSIS — R197 Diarrhea, unspecified: Secondary | ICD-10-CM | POA: Diagnosis present

## 2015-12-28 DIAGNOSIS — E785 Hyperlipidemia, unspecified: Secondary | ICD-10-CM | POA: Diagnosis present

## 2015-12-28 DIAGNOSIS — I672 Cerebral atherosclerosis: Secondary | ICD-10-CM | POA: Diagnosis present

## 2015-12-28 DIAGNOSIS — Z823 Family history of stroke: Secondary | ICD-10-CM

## 2015-12-28 DIAGNOSIS — Z66 Do not resuscitate: Secondary | ICD-10-CM | POA: Diagnosis present

## 2015-12-28 HISTORY — DX: Unspecified osteoarthritis, unspecified site: M19.90

## 2015-12-28 HISTORY — DX: Cerebral infarction, unspecified: I63.9

## 2015-12-28 HISTORY — DX: Gastro-esophageal reflux disease without esophagitis: K21.9

## 2015-12-28 HISTORY — DX: Other specified postprocedural states: R11.2

## 2015-12-28 HISTORY — DX: Other specified postprocedural states: Z98.890

## 2015-12-28 HISTORY — DX: Pure hypercholesterolemia, unspecified: E78.00

## 2015-12-28 LAB — COMPREHENSIVE METABOLIC PANEL
ALBUMIN: 4 g/dL (ref 3.5–5.0)
ALK PHOS: 54 U/L (ref 38–126)
ALT: 18 U/L (ref 14–54)
AST: 29 U/L (ref 15–41)
Anion gap: 10 (ref 5–15)
BILIRUBIN TOTAL: 1 mg/dL (ref 0.3–1.2)
BUN: 5 mg/dL — ABNORMAL LOW (ref 6–20)
CALCIUM: 9.3 mg/dL (ref 8.9–10.3)
CO2: 25 mmol/L (ref 22–32)
CREATININE: 0.72 mg/dL (ref 0.44–1.00)
Chloride: 95 mmol/L — ABNORMAL LOW (ref 101–111)
GFR calc non Af Amer: >60 mL/min (ref >60)
GLUCOSE: 123 mg/dL — AB (ref 65–99)
Potassium: 3.8 mmol/L (ref 3.5–5.1)
SODIUM: 130 mmol/L — AB (ref 135–145)
TOTAL PROTEIN: 6.4 g/dL — AB (ref 6.5–8.1)

## 2015-12-28 LAB — I-STAT CHEM 8, ED
BUN: 4 mg/dL — AB (ref 6–20)
CREATININE: 0.6 mg/dL (ref 0.44–1.00)
Calcium, Ion: 1.1 mmol/L — ABNORMAL LOW (ref 1.15–1.40)
Chloride: 93 mmol/L — ABNORMAL LOW (ref 101–111)
GLUCOSE: 121 mg/dL — AB (ref 65–99)
HEMATOCRIT: 39 % (ref 36.0–46.0)
Hemoglobin: 13.3 g/dL (ref 12.0–15.0)
POTASSIUM: 3.8 mmol/L (ref 3.5–5.1)
Sodium: 130 mmol/L — ABNORMAL LOW (ref 135–145)
TCO2: 24 mmol/L (ref 0–100)

## 2015-12-28 LAB — CBC
HEMATOCRIT: 39.2 % (ref 36.0–46.0)
HEMOGLOBIN: 14.2 g/dL (ref 12.0–15.0)
MCH: 30.5 pg (ref 26.0–34.0)
MCHC: 36.2 g/dL — ABNORMAL HIGH (ref 30.0–36.0)
MCV: 84.1 fL (ref 78.0–100.0)
PLATELETS: 272 10*3/uL (ref 150–400)
RBC: 4.66 MIL/uL (ref 3.87–5.11)
RDW: 11.9 % (ref 11.5–15.5)
WBC: 5.2 10*3/uL (ref 4.0–10.5)

## 2015-12-28 LAB — PROTIME-INR
INR: 0.96
PROTHROMBIN TIME: 12.7 s (ref 11.4–15.2)

## 2015-12-28 LAB — DIFFERENTIAL
Basophils Absolute: 0 10*3/uL (ref 0.0–0.1)
Basophils Relative: 0 %
EOS PCT: 0 %
Eosinophils Absolute: 0 10*3/uL (ref 0.0–0.7)
LYMPHS PCT: 27 %
Lymphs Abs: 1.4 10*3/uL (ref 0.7–4.0)
MONO ABS: 0.8 10*3/uL (ref 0.1–1.0)
Monocytes Relative: 15 %
NEUTROS ABS: 3 10*3/uL (ref 1.7–7.7)
Neutrophils Relative %: 58 %

## 2015-12-28 LAB — I-STAT TROPONIN, ED: Troponin i, poc: 0 ng/mL (ref 0.00–0.08)

## 2015-12-28 LAB — CBG MONITORING, ED: Glucose-Capillary: 123 mg/dL — ABNORMAL HIGH (ref 65–99)

## 2015-12-28 LAB — APTT: aPTT: 27 seconds (ref 24–36)

## 2015-12-28 MED ORDER — STROKE: EARLY STAGES OF RECOVERY BOOK: Freq: Once | Status: DC

## 2015-12-28 MED ORDER — ENOXAPARIN SODIUM 40 MG/0.4ML ~~LOC~~ SOLN
40.0000 mg | SUBCUTANEOUS | Status: DC
  Administered 2015-12-28 – 2015-12-29 (×2): 40 mg via SUBCUTANEOUS
  Filled 2015-12-28 (×2): qty 0.4

## 2015-12-28 MED ORDER — WHITE PETROLATUM GEL
Status: AC
  Administered 2015-12-28: 1
  Filled 2015-12-28: qty 1

## 2015-12-28 MED ORDER — ASPIRIN 300 MG RE SUPP: 300.0000 mg | Freq: Every day | RECTAL | Status: DC

## 2015-12-28 MED ORDER — METOPROLOL TARTRATE 5 MG/5ML IV SOLN: 5.0000 mg | INTRAVENOUS | Status: DC | PRN

## 2015-12-28 MED ORDER — DIPHENHYDRAMINE HCL 50 MG/ML IJ SOLN
12.5000 mg | Freq: Once | INTRAMUSCULAR | Status: AC
  Administered 2015-12-28: 12.5 mg via INTRAVENOUS
  Filled 2015-12-28: qty 1

## 2015-12-28 MED ORDER — HYDRALAZINE HCL 20 MG/ML IJ SOLN: 10.0000 mg | INTRAMUSCULAR | Status: DC | PRN

## 2015-12-28 MED ORDER — SODIUM CHLORIDE 0.9 % IV SOLN
INTRAVENOUS | Status: DC
  Administered 2015-12-28: 18:00:00 via INTRAVENOUS

## 2015-12-28 MED ORDER — ASPIRIN 325 MG PO TABS
325.0000 mg | ORAL_TABLET | Freq: Every day | ORAL | Status: DC
  Administered 2015-12-29 – 2015-12-30 (×2): 325 mg via ORAL
  Filled 2015-12-28 (×3): qty 1

## 2015-12-28 NOTE — ED Triage Notes (Signed)
Pt. Reports that yesterday around 15:00 the she developed lt. Face numbness and lt. Hand numbess.  Pt. Is alert and oriented X 4. Skin is pink, warm and dry.  Speech is clear. Pt. Denies any pain

## 2015-12-28 NOTE — ED Provider Notes (Signed)
[facility]-EMERGENCY DEPT Provider Note   CSN: [id number] Arrival date & time: 12/28/15  0865     History   Chief Complaint Chief Complaint  Patient presents with  . Numbness    HPI [name] is a 82 y.o. female.  The history is provided by the patient and a relative.  Cerebrovascular Accident  This is a new problem. The current episode started 12 to 24 hours ago. The problem occurs constantly. The problem has been gradually worsening. Pertinent negatives include no chest pain, no abdominal pain, no headaches and no shortness of breath. Associated symptoms comments: Left sided numbness. Nothing aggravates the symptoms. Nothing relieves the symptoms. She has tried nothing for the symptoms. The treatment provided no relief.    Past Medical History:  Diagnosis Date  . Hypertension     Patient Active Problem List   Diagnosis Date Noted  . Left sided numbness 12/28/2015  . Nausea & vomiting 12/28/2015  . Diarrhea 12/28/2015  . HTN (hypertension) 12/28/2015  . Acute encephalopathy 12/23/2015  . UTI (urinary tract infection) 12/23/2015  . Dehydration with hyponatremia 12/23/2015    History reviewed. No pertinent surgical history.  OB History    No data available       Home Medications    Prior to Admission medications   Medication Sig Start Date End Date Taking? Authorizing Provider  amLODipine (NORVASC) 5 MG tablet Take 5 mg by mouth daily.   Yes Historical Provider, MD  atenolol (TENORMIN) 50 MG tablet Take 50 mg by mouth daily.   Yes Historical Provider, MD  latanoprost (XALATAN) 0.005 % ophthalmic solution Place 1 drop into both eyes at bedtime.   Yes Historical Provider, MD  ondansetron (ZOFRAN) 4 MG tablet Take 1 tablet (4 mg total) by mouth every 6 (six) hours as needed for nausea. 12/25/15  Yes Shanker Levora Dredge, MD    Family History Family History  Problem Relation Age of Onset  . CAD Father   . Stroke Brother   . Alcohol abuse Brother   . CAD Brother       Social History Social History  Substance Use Topics  . Smoking status: Never Smoker  . Smokeless tobacco: Never Used  . Alcohol use No     Allergies   Ativan [lorazepam] and Phenergan [promethazine]   Review of Systems Review of Systems  Constitutional: Negative for chills and fever.  HENT: Negative for ear pain and sore throat.   Eyes: Negative for pain and visual disturbance.  Respiratory: Negative for cough and shortness of breath.   Cardiovascular: Negative for chest pain and palpitations.  Gastrointestinal: Negative for abdominal pain and vomiting.  Genitourinary: Negative for dysuria and hematuria.  Musculoskeletal: Negative for arthralgias and back pain.  Skin: Negative for color change and rash.  Neurological: Positive for numbness. Negative for seizures, syncope and headaches.  Psychiatric/Behavioral: Negative for behavioral problems.  All other systems reviewed and are negative.    Physical Exam Updated Vital Signs BP (!) 144/44   Pulse 61   Temp 98.1 F (36.7 C)   Resp 16   Ht 5' (1.524 m)   Wt 48.1 kg   SpO2 99%   BMI 20.70 kg/m   Physical Exam  Constitutional: She is oriented to person, place, and time. She appears well-developed and well-nourished. No distress.  HENT:  Head: Normocephalic and atraumatic.  Eyes: Conjunctivae are normal.  Neck: Neck supple.  Cardiovascular: Normal rate and regular rhythm.   No murmur heard. Pulmonary/Chest: Effort  normal and breath sounds normal. No respiratory distress.  Abdominal: Soft. There is no tenderness.  Musculoskeletal: She exhibits no edema.  Neurological: She is alert and oriented to person, place, and time. She has normal strength. A sensory deficit is present. No cranial nerve deficit. She displays a negative Romberg sign. Coordination and gait normal. GCS eye subscore is 4. GCS verbal subscore is 5. GCS motor subscore is 6.  Patient is unable to tell sharp versus dull on her upper and lower  extremities. Has decreased sensation, subjective feeling of numbness of the face however is able to tell sharp versus dull in bilateral face  Skin: Skin is warm and dry.  Psychiatric: She has a normal mood and affect.  Nursing note and vitals reviewed.    ED Treatments / Results  Labs (all labs ordered are listed, but only abnormal results are displayed) Labs Reviewed  CBC - Abnormal; Notable for the following:       Result Value   MCHC 36.2 (*)    All other components within normal limits  COMPREHENSIVE METABOLIC PANEL - Abnormal; Notable for the following:    Sodium 130 (*)    Chloride 95 (*)    Glucose, Bld 123 (*)    BUN 5 (*)    Total Protein 6.4 (*)    All other components within normal limits  CBG MONITORING, ED - Abnormal; Notable for the following:    Glucose-Capillary 123 (*)    All other components within normal limits  I-STAT CHEM 8, ED - Abnormal; Notable for the following:    Sodium 130 (*)    Chloride 93 (*)    BUN 4 (*)    Glucose, Bld 121 (*)    Calcium, Ion 1.10 (*)    All other components within normal limits  C DIFFICILE QUICK SCREEN W PCR REFLEX  PROTIME-INR  APTT  DIFFERENTIAL  I-STAT TROPOININ, ED    EKG  EKG Interpretation  Date/Time:  Tuesday December 28 2015 09:03:43 EST Ventricular Rate:  56 PR Interval:    QRS Duration: 106 QT Interval:  453 QTC Calculation: 438 R Axis:   83 Text Interpretation:  Sinus rhythm Borderline right axis deviation No acute changes No old tracing to compare Confirmed by NANAVATI, MD, Janey GentaANKIT [phone]([id number]) on 12/28/2015 10:18:05 AM       Radiology Ct Head Wo Contrast  Result Date: 12/28/2015 CLINICAL DATA:  Left facial numbness, left arm numbness EXAM: CT HEAD WITHOUT CONTRAST TECHNIQUE: Contiguous axial images were obtained from the base of the skull through the vertex without intravenous contrast. COMPARISON:  None. FINDINGS: Brain: The ventricular system is prominent as are the cortical sulci and frontal CSF  spaces consistent with diffuse atrophy. The septum is midline in position. The fourth ventricle and basilar cisterns are unremarkable. There is moderately severe small vessel ischemic change throughout the periventricular white matter. A left subcortical infarct cannot be excluded in the parietal region. MRI would be helpful if more sensitive assessment is warranted. No hemorrhage, or mass lesion is seen. Vascular: No vascular abnormality is seen on this unenhanced study. Skull: No calvarial lesion is noted. Sinuses/Orbits: However, there is complete opacification of the right maxillary sinus with partial opacification of ethmoid air cells and the left sphenoid sinus consistent with diffuse sinus disease. The left sphenoid sinus may represent chronic sinusitis with somewhat thickened walls. Other: None IMPRESSION: 1. Moderate atrophy and small vessel ischemic change. Cannot exclude acute or subacute subcortical infarct in the left parietal region.  Consider MRI of the brain. 2. Sinusitis involving the right maxillary sinus , ethmoid air cells, and the left sphenoid sinus some of which may be chronic. Electronically Signed   By: Dwyane DeePaul  Barry M.D.   On: 12/28/2015 10:42    Procedures Procedures (including critical care time)  Medications Ordered in ED Medications  0.9 %  sodium chloride infusion (not administered)  diphenhydrAMINE (BENADRYL) injection 12.5 mg (not administered)  hydrALAZINE (APRESOLINE) injection 10 mg (not administered)  metoprolol (LOPRESSOR) injection 5 mg (not administered)     Initial Impression / Assessment and Plan / ED Course  I have reviewed the triage vital signs and the nursing notes.  Pertinent labs & imaging results that were available during my care of the patient were reviewed by me and considered in my medical decision making (see chart for details).  Clinical Course     82 year old female comes today with over 18 hours of left-sided upper and lower extremity  numbness as well as subjective numbness of the face. She has no cranial nerve deficits and no motor deficits. She is able to ambulate normally. Vital signs are stable she's afebrile. EMS blood glucose was 130 she was hypertensive earlier but without intervention she has resolved, currently 130s over 60s. Patient will get the code stroke workup though she is outside of any window for intervention. No other complaints at this time.  Patient EKG shows sinus rhythm without any interval abnormalities acute ischemia or other arrhythmia. Glucose here is 123, chemistry shows sodium 1:30, slightly lower than previous days no signs of kidney dysfunction, no anemia or leukocytosis.  CT of the head does not show any acute intracranial abnormality. Patient will be admitted to the hospitalist service with neurology following for workup of stroke. Vital signs stable time handoff of care. Further made in this patient's care please see inpatient care notes Final Clinical Impressions(s) / ED Diagnoses   Final diagnoses:  Nausea vomiting and diarrhea  Left sided numbness  Left sided numbness    New Prescriptions New Prescriptions   No medications on file     Cherlynn PerchesEric Quenten Nawaz, MD 12/28/15 1339    Derwood KaplanAnkit Nanavati, MD 12/29/15 [id number]

## 2015-12-28 NOTE — Consult Note (Signed)
Requesting Physician: Dr. Lenoria Chime    Chief Complaint: stroke  History obtained from:  Patient     HPI:                                                                                                                                         [name] is an 82 y.o. female with past medical history of sickle cell, renal insufficiency, multiple myeloma, diabetes, congestive heart failure who noted yesterday at approximately 1500 hrs that her left face left arm and left leg had decreased sensation and felt like he was asleep. The symptoms did not resolve over time which is why she is in the emergency room at this time. Patient underwent a CT scan upon entering ED which was negative. Due to concern of possible CVA patient is going to be admitted by internal medicine and neurology was asked to consult. Patient does not take aspirin on a regular basis. Patient denies smoking or drinking. Patient denies any significant weakness on the left side impaired to the right side and all of her symptoms are mainly sensation  Date last known well: Date: 12/27/2015 Time last known well: Time: 15:00 tPA Given: No: minimal symptoms  Past Medical History:  Diagnosis Date  . Hypertension     History reviewed. No pertinent surgical history.  Family History  Problem Relation Age of Onset  . CAD Father   . Stroke Brother   . Alcohol abuse Brother   . CAD Brother    Social History:  reports that she has never smoked. She has never used smokeless tobacco. She reports that she does not drink alcohol or use drugs.  Allergies:  Allergies  Allergen Reactions  . Ativan [Lorazepam]     altered  . Phenergan [Promethazine]     "Seeing things"      Medications:                                                                                                                           No current facility-administered medications for this encounter.    Current Outpatient Prescriptions  Medication Sig Dispense  Refill  . amLODipine (NORVASC) 5 MG tablet Take 5 mg by mouth daily.    Marland Kitchen atenolol (TENORMIN) 50 MG tablet Take 50 mg by mouth daily.    Marland Kitchen latanoprost (XALATAN) 0.005 % ophthalmic solution Place  1 drop into both eyes at bedtime.    . ondansetron (ZOFRAN) 4 MG tablet Take 1 tablet (4 mg total) by mouth every 6 (six) hours as needed for nausea. 20 tablet 0     ROS:                                                                                                                                       History obtained from the patient  General ROS: negative for - chills, fatigue, fever, night sweats, weight gain or weight loss Psychological ROS: negative for - behavioral disorder, hallucinations, memory difficulties, mood swings or suicidal ideation Ophthalmic ROS: negative for - blurry vision, double vision, eye pain or loss of vision ENT ROS: negative for - epistaxis, nasal discharge, oral lesions, sore throat, tinnitus or vertigo Allergy and Immunology ROS: negative for - hives or itchy/watery eyes Hematological and Lymphatic ROS: negative for - bleeding problems, bruising or swollen lymph nodes Endocrine ROS: negative for - galactorrhea, hair pattern changes, polydipsia/polyuria or temperature intolerance Respiratory ROS: negative for - cough, hemoptysis, shortness of breath or wheezing Cardiovascular ROS: negative for - chest pain, dyspnea on exertion, edema or irregular heartbeat Gastrointestinal ROS: negative for - abdominal pain, diarrhea, hematemesis, nausea/vomiting or stool incontinence Genito-Urinary ROS: negative for - dysuria, hematuria, incontinence or urinary frequency/urgency Musculoskeletal ROS: negative for - joint swelling or muscular weakness Neurological ROS: as noted in HPI Dermatological ROS: negative for rash and skin lesion changes  Neurologic Examination:                                                                                                      Blood pressure  122/61, pulse (!) 56, temperature 98.1 F (36.7 C), resp. rate 11, height 5' (1.524 m), weight 48.1 kg (106 lb), SpO2 99 %.  HEENT-  Normocephalic, no lesions, without obvious abnormality.  Normal external eye and conjunctiva.  Normal TM's bilaterally.  Normal auditory canals and external ears. Normal external nose, mucus membranes and septum.  Normal pharynx. Cardiovascular- S1, S2 normal, pulses palpable throughout   Lungs- chest clear, no wheezing, rales, normal symmetric air entry Abdomen- normal findings: bowel sounds normal Extremities- no edema Lymph-no adenopathy palpable Musculoskeletal-no joint tenderness, deformity or swelling Skin-warm and dry, no hyperpigmentation, vitiligo, or suspicious lesions  Neurological Examination Mental Status: Alert, oriented, thought content appropriate.  Speech fluent without evidence of aphasia.  Able to follow 3 step commands without difficulty. Cranial Nerves: II:  Visual fields grossly normal,  III,IV, VI: ptosis not present, extra-ocular motions intact bilaterally, pupils equal, round, reactive to light and accommodation V,VII: smile asymmetric--however speed noted the patient is pulling the left aspect of the corner of her mouth to the side. When asked to wean quit her right side she then pulls the right aspect of the corner of the right side. It is difficult to assess if she actually has a true facial droop. facial light touch sensation stated to be decreased on the left side and does not split midline VIII: hearing normal bilaterally IX,X: uvula rises symmetrically XI: bilateral shoulder shrug XII: midline tongue extension Motor: Patient has 4/5 strength throughout. With bilateral leg she gives minimal effort compared to her upper extremities. However as stated this was bilateral and not unilateral. Sensory: Pinprick and light touch intact stated to be decreased on the left arm but equal on the legs. Deep Tendon Reflexes: 2+ and symmetric  throughout Plantars: Right: downgoing   Left: downgoing Cerebellar: normal finger-to-nose, and normal heel-to-shin test Gait: Not tested       Lab Results: Basic Metabolic Panel:  Recent Labs Lab 12/24/15 0516 12/24/15 1250 12/24/15 2104 12/25/15 0422 12/28/15 0918 12/28/15 0923  NA 133* 132* 135 135 130* 130*  K 2.9* 3.5 3.3* 3.3* 3.8 3.8  CL 99* 98* 100* 102 95* 93*  CO2 24 26 27 25 25   --   GLUCOSE 101* 114* 121* 116* 123* 121*  BUN 5* 7 7 7  5* 4*  CREATININE 0.63 0.63 0.66 0.55 0.72 0.60  CALCIUM 8.3* 8.4* 8.9 8.7* 9.3  --   MG 2.0  --   --   --   --   --   PHOS 3.1  --   --   --   --   --     Liver Function Tests:  Recent Labs Lab 12/23/15 1332 12/24/15 0516 12/28/15 0918  AST 27 21 29   ALT 15 11* 18  ALKPHOS 62 45 54  BILITOT 1.3* 1.0 1.0  PROT 6.4* 5.3* 6.4*  ALBUMIN 4.1 3.2* 4.0    Recent Labs Lab 12/23/15 1332  LIPASE 47   No results for input(s): AMMONIA in the last 168 hours.  CBC:  Recent Labs Lab 12/23/15 1332 12/24/15 0516 12/28/15 0918 12/28/15 0923  WBC 6.2 5.3 5.2  --   NEUTROABS  --   --  3.0  --   HGB 13.9 11.5* 14.2 13.3  HCT 36.8 31.9* 39.2 39.0  MCV 82.7 84.6 84.1  --   PLT 184 147* 272  --     Cardiac Enzymes: No results for input(s): CKTOTAL, CKMB, CKMBINDEX, TROPONINI in the last 168 hours.  Lipid Panel: No results for input(s): CHOL, TRIG, HDL, CHOLHDL, VLDL, LDLCALC in the last 168 hours.  CBG:  Recent Labs Lab 12/28/15 0930  GLUCAP 74*    Microbiology: Results for orders placed or performed during the hospital encounter of 12/23/15  Urine culture     Status: Abnormal   Collection Time: 12/23/15  4:07 PM  Result Value Ref Range Status   Specimen Description URINE, CLEAN CATCH  Final   Special Requests ADDED [id number]  Final   Culture MULTIPLE SPECIES PRESENT, SUGGEST RECOLLECTION (A)  Final   Report Status 12/25/2015 FINAL  Final    Coagulation Studies:  Recent Labs  12/28/15 0918   LABPROT 12.7  INR 0.96    Imaging: Ct Head Wo Contrast  Result Date: 12/28/2015 CLINICAL DATA:  Left facial numbness, left arm numbness  EXAM: CT HEAD WITHOUT CONTRAST TECHNIQUE: Contiguous axial images were obtained from the base of the skull through the vertex without intravenous contrast. COMPARISON:  None. FINDINGS: Brain: The ventricular system is prominent as are the cortical sulci and frontal CSF spaces consistent with diffuse atrophy. The septum is midline in position. The fourth ventricle and basilar cisterns are unremarkable. There is moderately severe small vessel ischemic change throughout the periventricular white matter. A left subcortical infarct cannot be excluded in the parietal region. MRI would be helpful if more sensitive assessment is warranted. No hemorrhage, or mass lesion is seen. Vascular: No vascular abnormality is seen on this unenhanced study. Skull: No calvarial lesion is noted. Sinuses/Orbits: However, there is complete opacification of the right maxillary sinus with partial opacification of ethmoid air cells and the left sphenoid sinus consistent with diffuse sinus disease. The left sphenoid sinus may represent chronic sinusitis with somewhat thickened walls. Other: None IMPRESSION: 1. Moderate atrophy and small vessel ischemic change. Cannot exclude acute or subacute subcortical infarct in the left parietal region. Consider MRI of the brain. 2. Sinusitis involving the right maxillary sinus , ethmoid air cells, and the left sphenoid sinus some of which may be chronic. Electronically Signed   By: Ivar Drape M.D.   On: 12/28/2015 10:42       Assessment and plan discussed with with attending physician and they are in agreement.    Etta Quill PA-C [facility] [phone]  12/28/2015, 11:38 AM   Assessment: 82 y.o. female presenting to the emergency department with a 20 hour history of left-sided decreased sensation. Exam is notable for a left pronator  drift, and subjective left face and arm decreased sensation. It should be noted that exam does also have some functional qualities with decreased effort with lower extremities. CT of head did not show any acute infarct however cannot rule out CVA at this time. Would recommend MRI brain. If MRI is positive for stroke would continue with stroke workup.  Stroke Risk Factors - diabetes mellitus

## 2015-12-28 NOTE — H&P (Signed)
History and Physical    [name] UJW:[id number] DOB: 10/26/1933 DOA: 12/28/2015   [facility]   Patient coming from/Resides with: Private residence  Admission status: Observation/telemetry -it may be medically necessary to stay a minimum 2 midnights to rule out impending and/or unexpected changes in physiologic status that may differ from initial evaluation performed in the ER and/or at time of admission therefore please consider reevaluation of admission status within the next 24 hours.   Chief Complaint: Left-sided numbness  HPI: Kristen DarkJenie S Deschamps is a 82 y.o. female with medical history significant for anxiety, hypertension. Recent admission for hyponatremia with dehydration, presumed UTI and nausea with vomiting without definitive etiology although cholelithiasis noted on CT the abdomen. Since discharge patient's had persistent issues with ongoing nausea and vomiting for which she has taken Zofran. She also reports she was taking a pain pill since discharge although there is no prescribed pain medications listed on her medication reconciliation from recent discharge. Patient poor set yesterday about 1 or after taking her nausea medication (and ? dose of unknown pain medicine) she began experiencing left-sided numbness. She denied weakness, visual disturbances or difficulty understanding words or speaking. She reports symptoms involve the left side of the face, left arm and left leg without any ataxia or imbalance. She did not come to the hospital because she felt the symptoms were related to side effects of the medication she had been taking. In addition to these new symptoms she has now developed watery diarrhea without abdominal pain or blood. She's not had any fevers or chills since coming home. She states she has fallen at least one time since coming home. She reports she has abdominal ultrasound ordered for next week. He reports poor oral intake and has  eaten very little since discharge.  ED Course:  Vital Signs: BP (!) 141/40   Pulse (!) 58   Temp 98.1 F (36.7 C)   Resp 13   Ht 5' (1.524 m)   Wt 48.1 kg (106 lb)   SpO2 97%   BMI 20.70 kg/m  CT head without contrast: Moderate atrophy with small vessel ischemic change. Radiologist could not exclude acute versus subacute subcortical infarct in the left parietal region Lab data: Sodium 1:30, potassium 3.8, chloride 93, BUN 5, creatinine 0.72, glucose 123, anion gap 10, LFTs normal except for total protein 6.4, albumin 4.0, poc troponin 0.00, white count 5200 normal differential, hemoglobin 14.2, platelets 272,000, coags are normal Medications and treatments: None  Review of Systems:  In addition to the HPI above,  No Fever-chills, myalgias or other constitutional symptoms No Headache, changes with Vision or hearing, new weakness in any extremity, dizziness, dysarthria or word finding difficulty, gait disturbance or imbalance, tremors or seizure activity No problems swallowing food or Liquids, indigestion/reflux, choking or coughing while eating, abdominal pain with or after eating No Chest pain, Cough or Shortness of Breath, palpitations, orthopnea or DOE No Abdominal pain, melena,hematochezia, dark tarry stools, constipation No dysuria, malodorous urine, hematuria or flank pain No new skin rashes, lesions, masses or bruises, No new joint pains, aches, swelling or redness No recent unintentional weight gain or loss No polyuria, polydypsia or polyphagia   Past Medical History:  Diagnosis Date  . Hypertension     History reviewed. No pertinent surgical history.  Social History   Social History  . Marital status: Single    Spouse name: N/A  . Number of children: N/A  . Years of education: N/A  Occupational History  . Not on file.   Social History Main Topics  . Smoking status: Never Smoker  . Smokeless tobacco: Never Used  . Alcohol use No  . Drug use: No  . Sexual  activity: Not on file   Other Topics Concern  . Not on file   Social History Narrative  . No narrative on file    Mobility: Without assistive devices Work history: Retired   Allergies  Allergen Reactions  . Ativan [Lorazepam]     altered  . Phenergan [Promethazine]     "Seeing things"      Family History  Problem Relation Age of Onset  . CAD Father   . Stroke Brother   . Alcohol abuse Brother   . CAD Brother      Prior to Admission medications   Medication Sig Start Date End Date Taking? Authorizing Provider  amLODipine (NORVASC) 5 MG tablet Take 5 mg by mouth daily.   Yes Historical Provider, MD  atenolol (TENORMIN) 50 MG tablet Take 50 mg by mouth daily.   Yes Historical Provider, MD  latanoprost (XALATAN) 0.005 % ophthalmic solution Place 1 drop into both eyes at bedtime.   Yes Historical Provider, MD  ondansetron (ZOFRAN) 4 MG tablet Take 1 tablet (4 mg total) by mouth every 6 (six) hours as needed for nausea. 12/25/15  Yes Shanker Levora Dredge, MD    Physical Exam: Vitals:   12/28/15 1200 12/28/15 1209 12/28/15 1230 12/28/15 1243  BP: 124/89 124/89 135/55 (!) 141/40  Pulse: 61 61 63 (!) 58  Resp: 17 15 23 13   Temp:      TempSrc:      SpO2: 100% 100% 100% 97%  Weight:      Height:          Constitutional: NAD, Mildly anxious, comfortable Eyes: PERRL, lids and conjunctivae normal ENMT: Mucous membranes are dry- Posterior pharynx clear of any exudate or lesions.Normal dentition.  Neck: normal, supple, no masses, no thyromegaly Respiratory: clear to auscultation bilaterally, no wheezing, no crackles. Normal respiratory effort. No accessory muscle use.  Cardiovascular: Regular rate and rhythm, no murmurs / rubs / gallops. No extremity edema. 2+ pedal pulses. No carotid bruits.  Abdomen: no tenderness, no masses palpated. No hepatosplenomegaly. Bowel sounds positive.  Musculoskeletal: no clubbing / cyanosis. No joint deformity upper and lower extremities. Good  ROM, no contractures. Normal muscle tone.  Skin: no rashes, lesions, ulcers. No induration Neurologic: CN 2-12 grossly intact. Sensation abnormal with inability to differentiate between dull and sharp sensation entire left side, equivocal sensation exam left face DTR normal. Strength 5/5 x all 4 extremities.  Psychiatric: Normal judgment and insight. Alert and oriented x 3. Mildly anxious mood.    Labs on Admission: I have personally reviewed following labs and imaging studies  CBC:  Recent Labs Lab 12/23/15 1332 12/24/15 0516 12/28/15 0918 12/28/15 0923  WBC 6.2 5.3 5.2  --   NEUTROABS  --   --  3.0  --   HGB 13.9 11.5* 14.2 13.3  HCT 36.8 31.9* 39.2 39.0  MCV 82.7 84.6 84.1  --   PLT 184 147* 272  --    Basic Metabolic Panel:  Recent Labs Lab 12/24/15 0516 12/24/15 1250 12/24/15 2104 12/25/15 0422 12/28/15 0918 12/28/15 0923  NA 133* 132* 135 135 130* 130*  K 2.9* 3.5 3.3* 3.3* 3.8 3.8  CL 99* 98* 100* 102 95* 93*  CO2 24 26 27 25 25   --   GLUCOSE  101* 114* 121* 116* 123* 121*  BUN 5* 7 7 7  5* 4*  CREATININE 0.63 0.63 0.66 0.55 0.72 0.60  CALCIUM 8.3* 8.4* 8.9 8.7* 9.3  --   MG 2.0  --   --   --   --   --   PHOS 3.1  --   --   --   --   --    GFR: Estimated Creatinine Clearance (by C-G formula based on SCr of 0.6 mg/dL) Female: 91.440.3 mL/min Female: 50.1 mL/min Liver Function Tests:  Recent Labs Lab 12/23/15 1332 12/24/15 0516 12/28/15 0918  AST 27 21 29   ALT 15 11* 18  ALKPHOS 62 45 54  BILITOT 1.3* 1.0 1.0  PROT 6.4* 5.3* 6.4*  ALBUMIN 4.1 3.2* 4.0    Recent Labs Lab 12/23/15 1332  LIPASE 47   No results for input(s): AMMONIA in the last 168 hours. Coagulation Profile:  Recent Labs Lab 12/28/15 0918  INR 0.96   Cardiac Enzymes: No results for input(s): CKTOTAL, CKMB, CKMBINDEX, TROPONINI in the last 168 hours. BNP (last 3 results) No results for input(s): PROBNP in the last 8760 hours. HbA1C: No results for input(s): HGBA1C in the last  72 hours. CBG:  Recent Labs Lab 12/28/15 0930  GLUCAP 123*   Lipid Profile: No results for input(s): CHOL, HDL, LDLCALC, TRIG, CHOLHDL, LDLDIRECT in the last 72 hours. Thyroid Function Tests: No results for input(s): TSH, T4TOTAL, FREET4, T3FREE, THYROIDAB in the last 72 hours. Anemia Panel: No results for input(s): VITAMINB12, FOLATE, FERRITIN, TIBC, IRON, RETICCTPCT in the last 72 hours. Urine analysis:    Component Value Date/Time   COLORURINE YELLOW 12/23/2015 1607   APPEARANCEUR CLOUDY (A) 12/23/2015 1607   LABSPEC 1.025 12/23/2015 1607   PHURINE 6.5 12/23/2015 1607   GLUCOSEU NEGATIVE 12/23/2015 1607   HGBUR NEGATIVE 12/23/2015 1607   BILIRUBINUR SMALL (A) 12/23/2015 1607   KETONESUR 15 (A) 12/23/2015 1607   PROTEINUR 30 (A) 12/23/2015 1607   NITRITE POSITIVE (A) 12/23/2015 1607   LEUKOCYTESUR TRACE (A) 12/23/2015 1607   Sepsis Labs: @LABRCNTIP (procalcitonin:4,lacticidven:4) ) Recent Results (from the past 240 hour(s))  Urine culture     Status: Abnormal   Collection Time: 12/23/15  4:07 PM  Result Value Ref Range Status   Specimen Description URINE, CLEAN CATCH  Final   Special Requests ADDED [id number]  Final   Culture MULTIPLE SPECIES PRESENT, SUGGEST RECOLLECTION (A)  Final   Report Status 12/25/2015 FINAL  Final     Radiological Exams on Admission: Ct Head Wo Contrast  Result Date: 12/28/2015 CLINICAL DATA:  Left facial numbness, left arm numbness EXAM: CT HEAD WITHOUT CONTRAST TECHNIQUE: Contiguous axial images were obtained from the base of the skull through the vertex without intravenous contrast. COMPARISON:  None. FINDINGS: Brain: The ventricular system is prominent as are the cortical sulci and frontal CSF spaces consistent with diffuse atrophy. The septum is midline in position. The fourth ventricle and basilar cisterns are unremarkable. There is moderately severe small vessel ischemic change throughout the periventricular white matter. A left  subcortical infarct cannot be excluded in the parietal region. MRI would be helpful if more sensitive assessment is warranted. No hemorrhage, or mass lesion is seen. Vascular: No vascular abnormality is seen on this unenhanced study. Skull: No calvarial lesion is noted. Sinuses/Orbits: However, there is complete opacification of the right maxillary sinus with partial opacification of ethmoid air cells and the left sphenoid sinus consistent with diffuse sinus disease. The left sphenoid sinus  may represent chronic sinusitis with somewhat thickened walls. Other: None IMPRESSION: 1. Moderate atrophy and small vessel ischemic change. Cannot exclude acute or subacute subcortical infarct in the left parietal region. Consider MRI of the brain. 2. Sinusitis involving the right maxillary sinus , ethmoid air cells, and the left sphenoid sinus some of which may be chronic. Electronically Signed   By: Dwyane Dee M.D.   On: 12/28/2015 10:42    EKG: (Independently reviewed) sinus bradycardia with ventricular rate 56 bpm, QTC 438 ms, no acute ischemic changes, elevated J point in inferior leads, no previous EKGs for comparison  Assessment/Plan Principal Problem:   Left sided numbness -Presents with left side numbness onset at 1500 on 11/27 therefore out of window for thrombolytic therapies -CT head without contrast questions acute infarct with MRI recommended for clarification -Stat MRI MRA brain -Echocardiogram -Carotid duplex -Risk factor stratification with lipid panel/hemoglobin A1c -SLP/PT/OT evaluation -Frequent neuro checks -Antiplatelet with aspirin  Active Problems:   Dehydration with hyponatremia -Patient presents with hyponatremia and reported poor oral intake and new watery diarrhea -Previous admission hyponatremia suspected related to hydrochlorothiazide which has subsequently been discontinued -Suspect dehydration contributing at this juncture -Normal saline at 75 per hour -Chemistries in  a.m.    Nausea & vomiting -Primary reason for admission last encounter -Symptoms persist without abdominal pain and had been unresponsive to Zofran at home -Previous CT revealed cholelithiasis with normal LFTs so we'll check ultrasound while NPO -If ultrasound unrevealing patient may need HIDA scan with ejection fraction to rule out biliary dyskinesia/chronic cholecystitis -No abdominal pain and no anemia and no reports of blood in stools so doubt peptic ulcer disease but could treat presumptively if no other etiology found    Diarrhea -Reports watery diarrhea since discharge and patient did receive 3 doses of Rocephin during previous hospitalization -Check C. difficile PCR    HTN (hypertension) -Poorly controlled -On Norvasc and Tenormin at home with HCTZ being discontinued previous admission due to hyponatremia -While NPO utilize IV hydralazine and Lopressor      DVT prophylaxis: Lovenox  Code Status: DO NOT RESUSCITATE Family Communication: Daughter at bedside Disposition Plan: Anticipate discharge back to preadmission home environment when medically stable Consults called: Neurology/Oster     Russella Dar ANP-BC [facility] Pager [phone]   If 7PM-7AM, please contact night-coverage [email or web address] Password [email or web address]  12/28/2015, 1:19 PM

## 2015-12-28 NOTE — ED Notes (Addendum)
Pt ambulated to restroom, no complaints of nausea or dizziness.  

## 2015-12-28 NOTE — ED Notes (Addendum)
Pt. Arrived via EMS with a report of having lt. Facial numbness. Lt. Arm and lt. Leg numbness. GCS 15, Symptoms started 12/28/2015 15:00 .   NIH 1, Pt. Did not pass her swallow screen,  She reports having difficulty swallowing pills this morning and felt like the pill was stuck.  Lungs clear bilaterally, NSR on the monitor.  Skin is p/w/d.  Pt. Lives by herself and is ambulatory .  In the last 23 hrs. She has used a walker.  She denies any pain or discomfort and she has had no change in the lt. Side numbness. Pt. Denies any n/v/d.  Pt. Is schedule for an MRI, but they will not be ready for another hour.  Benadry 12.5mg   Is ordered to be given 15 minutes prior to the MRI

## 2015-12-28 NOTE — ED Notes (Signed)
cbg was 123 

## 2015-12-29 ENCOUNTER — Inpatient Hospital Stay (HOSPITAL_COMMUNITY): Payer: Medicare Other

## 2015-12-29 DIAGNOSIS — G4700 Insomnia, unspecified: Secondary | ICD-10-CM | POA: Diagnosis present

## 2015-12-29 DIAGNOSIS — I6789 Other cerebrovascular disease: Secondary | ICD-10-CM | POA: Diagnosis not present

## 2015-12-29 DIAGNOSIS — N179 Acute kidney failure, unspecified: Secondary | ICD-10-CM | POA: Diagnosis not present

## 2015-12-29 DIAGNOSIS — I672 Cerebral atherosclerosis: Secondary | ICD-10-CM | POA: Diagnosis present

## 2015-12-29 DIAGNOSIS — M6289 Other specified disorders of muscle: Secondary | ICD-10-CM

## 2015-12-29 DIAGNOSIS — I63331 Cerebral infarction due to thrombosis of right posterior cerebral artery: Secondary | ICD-10-CM | POA: Diagnosis not present

## 2015-12-29 DIAGNOSIS — R197 Diarrhea, unspecified: Secondary | ICD-10-CM

## 2015-12-29 DIAGNOSIS — R209 Unspecified disturbances of skin sensation: Secondary | ICD-10-CM | POA: Diagnosis not present

## 2015-12-29 DIAGNOSIS — I69398 Other sequelae of cerebral infarction: Secondary | ICD-10-CM | POA: Diagnosis not present

## 2015-12-29 DIAGNOSIS — I10 Essential (primary) hypertension: Secondary | ICD-10-CM

## 2015-12-29 DIAGNOSIS — F419 Anxiety disorder, unspecified: Secondary | ICD-10-CM | POA: Diagnosis present

## 2015-12-29 DIAGNOSIS — Z8249 Family history of ischemic heart disease and other diseases of the circulatory system: Secondary | ICD-10-CM | POA: Diagnosis not present

## 2015-12-29 DIAGNOSIS — Z66 Do not resuscitate: Secondary | ICD-10-CM | POA: Diagnosis present

## 2015-12-29 DIAGNOSIS — R200 Anesthesia of skin: Secondary | ICD-10-CM

## 2015-12-29 DIAGNOSIS — Z961 Presence of intraocular lens: Secondary | ICD-10-CM | POA: Diagnosis present

## 2015-12-29 DIAGNOSIS — I639 Cerebral infarction, unspecified: Secondary | ICD-10-CM | POA: Diagnosis present

## 2015-12-29 DIAGNOSIS — E871 Hypo-osmolality and hyponatremia: Secondary | ICD-10-CM | POA: Diagnosis present

## 2015-12-29 DIAGNOSIS — D571 Sickle-cell disease without crisis: Secondary | ICD-10-CM | POA: Diagnosis present

## 2015-12-29 DIAGNOSIS — E860 Dehydration: Secondary | ICD-10-CM | POA: Diagnosis present

## 2015-12-29 DIAGNOSIS — I69393 Ataxia following cerebral infarction: Secondary | ICD-10-CM | POA: Diagnosis present

## 2015-12-29 DIAGNOSIS — G479 Sleep disorder, unspecified: Secondary | ICD-10-CM | POA: Diagnosis not present

## 2015-12-29 DIAGNOSIS — E1151 Type 2 diabetes mellitus with diabetic peripheral angiopathy without gangrene: Secondary | ICD-10-CM | POA: Diagnosis present

## 2015-12-29 DIAGNOSIS — E785 Hyperlipidemia, unspecified: Secondary | ICD-10-CM | POA: Diagnosis present

## 2015-12-29 DIAGNOSIS — Z96643 Presence of artificial hip joint, bilateral: Secondary | ICD-10-CM | POA: Diagnosis present

## 2015-12-29 DIAGNOSIS — Z823 Family history of stroke: Secondary | ICD-10-CM | POA: Diagnosis not present

## 2015-12-29 DIAGNOSIS — Z888 Allergy status to other drugs, medicaments and biological substances status: Secondary | ICD-10-CM | POA: Diagnosis not present

## 2015-12-29 DIAGNOSIS — K219 Gastro-esophageal reflux disease without esophagitis: Secondary | ICD-10-CM | POA: Diagnosis present

## 2015-12-29 DIAGNOSIS — R112 Nausea with vomiting, unspecified: Secondary | ICD-10-CM | POA: Diagnosis present

## 2015-12-29 DIAGNOSIS — I69993 Ataxia following unspecified cerebrovascular disease: Secondary | ICD-10-CM | POA: Diagnosis not present

## 2015-12-29 DIAGNOSIS — K529 Noninfective gastroenteritis and colitis, unspecified: Secondary | ICD-10-CM | POA: Diagnosis not present

## 2015-12-29 DIAGNOSIS — Z9841 Cataract extraction status, right eye: Secondary | ICD-10-CM | POA: Diagnosis not present

## 2015-12-29 DIAGNOSIS — Z79899 Other long term (current) drug therapy: Secondary | ICD-10-CM | POA: Diagnosis not present

## 2015-12-29 DIAGNOSIS — G8194 Hemiplegia, unspecified affecting left nondominant side: Secondary | ICD-10-CM | POA: Diagnosis present

## 2015-12-29 LAB — VAS US CAROTID
LCCADDIAS: -13 cm/s
LEFT ECA DIAS: -4 cm/s
LEFT VERTEBRAL DIAS: 17 cm/s
LICADDIAS: -22 cm/s
LICADSYS: -139 cm/s
LICAPDIAS: -19 cm/s
LICAPSYS: -123 cm/s
Left CCA dist sys: -103 cm/s
Left CCA prox dias: 12 cm/s
Left CCA prox sys: 113 cm/s
RIGHT ECA DIAS: -6 cm/s
RIGHT VERTEBRAL DIAS: 9 cm/s
Right CCA prox dias: 7 cm/s
Right CCA prox sys: 82 cm/s
Right cca dist sys: -141 cm/s

## 2015-12-29 LAB — COMPREHENSIVE METABOLIC PANEL
ALBUMIN: 3.3 g/dL — AB (ref 3.5–5.0)
ALK PHOS: 48 U/L (ref 38–126)
ALT: 14 U/L (ref 14–54)
ANION GAP: 11 (ref 5–15)
AST: 21 U/L (ref 15–41)
BILIRUBIN TOTAL: 1.3 mg/dL — AB (ref 0.3–1.2)
BUN: 10 mg/dL (ref 6–20)
CALCIUM: 8.6 mg/dL — AB (ref 8.9–10.3)
CO2: 22 mmol/L (ref 22–32)
Chloride: 101 mmol/L (ref 101–111)
Creatinine, Ser: 0.86 mg/dL (ref 0.44–1.00)
GFR calc Af Amer: >60 mL/min (ref >60)
GLUCOSE: 94 mg/dL (ref 65–99)
POTASSIUM: 3.6 mmol/L (ref 3.5–5.1)
Sodium: 134 mmol/L — ABNORMAL LOW (ref 135–145)
TOTAL PROTEIN: 5.6 g/dL — AB (ref 6.5–8.1)

## 2015-12-29 LAB — LIPID PANEL
Cholesterol: 175 mg/dL (ref 0–200)
HDL: 36 mg/dL — AB (ref >40)
LDL CALC: 105 mg/dL — AB (ref 0–99)
TRIGLYCERIDES: 171 mg/dL — AB (ref <150)
Total CHOL/HDL Ratio: 4.9 RATIO
VLDL: 34 mg/dL (ref 0–40)

## 2015-12-29 LAB — CBC
HEMATOCRIT: 35.6 % — AB (ref 36.0–46.0)
HEMOGLOBIN: 12.8 g/dL (ref 12.0–15.0)
MCH: 30.9 pg (ref 26.0–34.0)
MCHC: 36 g/dL (ref 30.0–36.0)
MCV: 86 fL (ref 78.0–100.0)
Platelets: 251 10*3/uL (ref 150–400)
RBC: 4.14 MIL/uL (ref 3.87–5.11)
RDW: 12.2 % (ref 11.5–15.5)
WBC: 4.9 10*3/uL (ref 4.0–10.5)

## 2015-12-29 LAB — C DIFFICILE QUICK SCREEN W PCR REFLEX
C DIFFICILE (CDIFF) INTERP: NOT DETECTED
C DIFFICILE (CDIFF) TOXIN: NEGATIVE
C Diff antigen: NEGATIVE

## 2015-12-29 MED ORDER — ROSUVASTATIN CALCIUM 5 MG PO TABS: 5.0000 mg | ORAL_TABLET | Freq: Every day | ORAL | Status: DC

## 2015-12-29 MED ORDER — ROSUVASTATIN CALCIUM 5 MG PO TABS
5.0000 mg | ORAL_TABLET | Freq: Every day | ORAL | Status: DC
  Administered 2015-12-29: 5 mg via ORAL
  Filled 2015-12-29: qty 1

## 2015-12-29 MED ORDER — LATANOPROST 0.005 % OP SOLN
1.0000 [drp] | Freq: Every day | OPHTHALMIC | Status: DC
  Administered 2015-12-30: 1 [drp] via OPHTHALMIC
  Filled 2015-12-29: qty 2.5

## 2015-12-29 MED ORDER — ASPIRIN 325 MG PO TABS: 325.0000 mg | ORAL_TABLET | Freq: Every day | ORAL | Status: DC

## 2015-12-29 NOTE — Evaluation (Signed)
Physical Therapy Evaluation Patient Details Name: [name] MRN: [id number] DOB: 08/02/1933 Today's Date: 12/29/2015   History of Present Illness  Kristen SettleJenie S [name] a 82 y.o.femalewith medical history significant for anxiety, hypertension. Recent admission for hyponatremia with dehydration, presumed UTI and nausea with vomiting without definitive etiology although cholelithiasis noted on CT the abdomen. 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  Clinical Impression  Pt admitted with above. Pt was indep with RW prior to recent hospitalization over thanksgiving. Pt presenting with L sided weakness and numbness, impaired balance, and impaired cognition. Pt motivated and desires to return home. Pt to benefit from CIR to address impairments and achieve maximal functional return.    Follow Up Recommendations CIR    Equipment Recommendations  None recommended by PT    Recommendations for Other Services Rehab consult     Precautions / Restrictions Precautions Precautions: Fall Precaution Comments: L sided numbness Restrictions Weight Bearing Restrictions: No      Mobility  Bed Mobility               General bed mobility comments: pt up in chair  Transfers Overall transfer level: Needs assistance Equipment used: Rolling walker (2 wheeled) Transfers: Sit to/from Stand Sit to Stand: Min assist         General transfer comment: v/c's for safe hand placement  Ambulation/Gait Ambulation/Gait assistance: Min assist;Mod assist Ambulation Distance (Feet): 100 Feet Assistive device: Rolling walker (2 wheeled);1 person hand held assist Gait Pattern/deviations: Step-through pattern;Staggering left;Staggering right;Wide base of support Gait velocity: slow Gait velocity interpretation: Below normal speed for age/gender General Gait Details: pt amb 6550' with RW, pt min guard but noted vearing to the L. pt  then attempted to amb without RW and pt very unsteady requiring modA to maintain stability. pt became to feel whoozy requring max encouragement to maintain eye opening  Stairs            Wheelchair Mobility    Modified Rankin (Stroke Patients Only) Modified Rankin (Stroke Patients Only) Pre-Morbid Rankin Score: Slight disability Modified Rankin: Moderately severe disability     Balance Overall balance assessment: Needs assistance Sitting-balance support: Feet unsupported;No upper extremity supported Sitting balance-Leahy Scale: Fair     Standing balance support: Single extremity supported Standing balance-Leahy Scale: Poor Standing balance comment: needs external support                             Pertinent Vitals/Pain Pain Assessment: No/denies pain    Home Living Family/patient expects to be discharged to:: Inpatient rehab Living Arrangements: Alone Available Help at Discharge: Family;Available PRN/intermittently Type of Home: House Home Access: Stairs to enter Entrance Stairs-Rails: Right;Left;Can reach both Entrance Stairs-Number of Steps: 4 Home Layout: One level Home Equipment: Walker - 2 wheels;Cane - single point      Prior Function Level of Independence: Independent with assistive device(s)         Comments: reports she's been using RW since last hospital stay last week     Hand Dominance   Dominant Hand: Right    Extremity/Trunk Assessment   Upper Extremity Assessment: LUE deficits/detail       LUE Deficits / Details: grossly 3+/5   Lower Extremity Assessment: LLE deficits/detail   LLE Deficits / Details:  grossly 4/5  Cervical / Trunk Assessment: Normal  Communication   Communication: No difficulties  Cognition Arousal/Alertness:  Awake/alert (but sleepy) Behavior During Therapy: WFL for tasks assessed/performed Overall Cognitive Status: Impaired/Different from baseline Area of Impairment: Following commands;Problem  solving       Following Commands: Follows multi-step commands with increased time     Problem Solving: Slow processing;Decreased initiation;Difficulty sequencing General Comments: pt sleepy, v/c's to keep eyes open    General Comments General comments (skin integrity, edema, etc.): Vitals: BP 126/52, SpO2 95% on RA    Exercises     Assessment/Plan    PT Assessment Patient needs continued PT services  PT Problem List Decreased range of motion;Decreased strength;Decreased activity tolerance;Decreased balance;Decreased mobility;Decreased coordination;Decreased cognition;Decreased knowledge of use of DME;Decreased safety awareness          PT Treatment Interventions DME instruction;Gait training;Stair training;Functional mobility training;Therapeutic activities;Therapeutic exercise;Balance training    PT Goals (Current goals can be found in the Care Plan section)  Acute Rehab PT Goals Patient Stated Goal: L side to get stronger and to be able to eat PT Goal Formulation: With patient Time For Goal Achievement: 01/05/16 Potential to Achieve Goals: Good Additional Goals Additional Goal #1: Pt to score >19 on DGI to indicate minimal falls risk.    Frequency Min 4X/week   Barriers to discharge Decreased caregiver support      Co-evaluation               End of Session Equipment Utilized During Treatment: Gait belt Activity Tolerance: Patient tolerated treatment well;Patient limited by fatigue Patient left: in chair;with call bell/phone within reach;with chair alarm set Nurse Communication: Mobility status         Time: [id number] PT Time Calculation (min) (ACUTE ONLY): 20 min   Charges:   PT Evaluation $PT Eval Moderate Complexity: 1 Procedure     PT G Codes:        [name] 12/29/2015, 9:06 AM   [name], PT, DPT Pager #: [phone] Office #: [phone]

## 2015-12-29 NOTE — Progress Notes (Signed)
STROKE TEAM PROGRESS NOTE   HISTORY OF PRESENT ILLNESS (per record)  In brief, this is an 82 year old woman who presents to emergency Department with complaints of left-sided numbness. Symptoms started more than 24 hours ago but she initially thought they were due to some recent medications that were started. When this persisted, she presented for evaluation. In the ED, she was noted to have sensory loss over the left arm and leg with more subjective sensory changes over the left side of her face. She has been admitted for stroke evaluation. There has been some concern for possible functional element to portions of the exam  Patient was not administered IV t-PA secondary to minimal symptoms.    SUBJECTIVE (INTERVAL HISTORY) Continues to have left sided numbness. No other complaints. Vitals stable overnight.    OBJECTIVE Temp:  [97.5 F (36.4 C)-98.6 F (37 C)] 98.4 F (36.9 C) (11/29 1311) Pulse Rate:  [55-79] 79 (11/29 1311) Cardiac Rhythm: Normal sinus rhythm (11/29 0736) Resp:  [16-20] 20 (11/29 1311) BP: (98-147)/(43-76) 147/54 (11/29 1311) SpO2:  [96 %-100 %] 99 % (11/29 1311)  CBC:  Recent Labs Lab 12/28/15 0918 12/28/15 0923 12/29/15 0459  WBC 5.2  --  4.9  NEUTROABS 3.0  --   --   HGB 14.2 13.3 12.8  HCT 39.2 39.0 35.6*  MCV 84.1  --  86.0  PLT 272  --  251    Basic Metabolic Panel:  Recent Labs Lab 12/24/15 0516  12/28/15 0918 12/28/15 0923 12/29/15 0459  NA 133*  < > 130* 130* 134*  K 2.9*  < > 3.8 3.8 3.6  CL 99*  < > 95* 93* 101  CO2 24  < > 25  --  22  GLUCOSE 101*  < > 123* 121* 94  BUN 5*  < > 5* 4* 10  CREATININE 0.63  < > 0.72 0.60 0.86  CALCIUM 8.3*  < > 9.3  --  8.6*  MG 2.0  --   --   --   --   PHOS 3.1  --   --   --   --   < > = values in this interval not displayed.  Lipid Panel:    Component Value Date/Time   CHOL 175 12/29/2015 0459   TRIG 171 (H) 12/29/2015 0459   HDL 36 (L) 12/29/2015 0459   CHOLHDL 4.9 12/29/2015 0459   VLDL 34  12/29/2015 0459   LDLCALC 105 (H) 12/29/2015 0459   HgbA1c: No results found for: HGBA1C Urine Drug Screen: No results found for: LABOPIA, COCAINSCRNUR, LABBENZ, AMPHETMU, THCU, LABBARB    IMAGING  Ct Head Wo Contrast  Result Date: 12/28/2015 CLINICAL DATA:  Left facial numbness, left arm numbness EXAM: CT HEAD WITHOUT CONTRAST TECHNIQUE: Contiguous axial images were obtained from the base of the skull through the vertex without intravenous contrast. COMPARISON:  None. FINDINGS: Brain: The ventricular system is prominent as are the cortical sulci and frontal CSF spaces consistent with diffuse atrophy. The septum is midline in position. The fourth ventricle and basilar cisterns are unremarkable. There is moderately severe small vessel ischemic change throughout the periventricular white matter. A left subcortical infarct cannot be excluded in the parietal region. MRI would be helpful if more sensitive assessment is warranted. No hemorrhage, or mass lesion is seen. Vascular: No vascular abnormality is seen on this unenhanced study. Skull: No calvarial lesion is noted. Sinuses/Orbits: However, there is complete opacification of the right maxillary sinus with partial opacification of  ethmoid air cells and the left sphenoid sinus consistent with diffuse sinus disease. The left sphenoid sinus may represent chronic sinusitis with somewhat thickened walls. Other: None IMPRESSION: 1. Moderate atrophy and small vessel ischemic change. Cannot exclude acute or subacute subcortical infarct in the left parietal region. Consider MRI of the brain. 2. Sinusitis involving the right maxillary sinus , ethmoid air cells, and the left sphenoid sinus some of which may be chronic. Electronically Signed   By: Dwyane Dee M.D.   On: 12/28/2015 10:42   Mr Maxine Glenn Head Wo Contrast  Result Date: 12/28/2015 CLINICAL DATA:  LEFT-sided numbness beginning yesterday, initially attributed to medication. Assess for stroke. History of  hypertension. EXAM: MRI HEAD WITHOUT CONTRAST MRA HEAD WITHOUT CONTRAST TECHNIQUE: Multiplanar, multiecho pulse sequences of the brain and surrounding structures were obtained without intravenous contrast. Angiographic images of the head were obtained using MRA technique without contrast. COMPARISON:  CT HEAD December 28, 2015 at 1028 hours FINDINGS: MRI HEAD FINDINGS BRAIN: 9 mm ovoid focus of reduced diffusion RIGHT thalamus with low ADC values. No susceptibility artifact to suggest hemorrhage. The ventricles and sulci are normal for patient's age. Patchy to confluent supratentorial pontine white matter FLAIR T2 hyperintensities. No suspicious parenchymal signal, masses or mass effect. Numerous RIGHT greater than LEFT basal ganglia perivascular spaces. No abnormal extra-axial fluid collections. No extra-axial masses though, contrast enhanced sequences would be more sensitive. VASCULAR: Normal major intracranial vascular flow voids present at skull base. SKULL AND UPPER CERVICAL SPINE: No abnormal sellar expansion. No suspicious calvarial bone marrow signal. Craniocervical junction maintained. SINUSES/ORBITS: Chronic RIGHT maxillary sinusitis. LEFT sphenoethmoidal mucosal thickening. Trace mastoid effusions. Status post RIGHT ocular lens implant. The included ocular globes and orbital contents are non-suspicious. OTHER: None. MRA HEAD FINDINGS ANTERIOR CIRCULATION: Normal flow related enhancement of the included cervical, petrous, cavernous and supraclinoid internal carotid arteries. Patent anterior communicating artery. Normal flow related enhancement of the anterior and middle cerebral arteries, including distal segments. Supernumerary anterior cerebral artery arising from LEFT A1-2 junction. Mild luminal regularity. No large vessel occlusion, high-grade stenosis, aneurysm. POSTERIOR CIRCULATION: LEFT vertebral artery is dominant. Basilar artery is patent, with normal flow related enhancement of the main branch  vessels. Normal flow related enhancement of the posterior cerebral arteries. Mild luminal regularity. No large vessel occlusion, high-grade stenosis, aneurysm. IMPRESSION: MRI HEAD: Acute sub cm RIGHT thalamic nonhemorrhagic infarct. Moderate to severe chronic small vessel ischemic disease. MRA HEAD: No emergent large vessel occlusion or high-grade stenosis. Mild intracranial atherosclerosis. Electronically Signed   By: Awilda Metro M.D.   On: 12/28/2015 19:01   Mr Brain Wo Contrast  Result Date: 12/28/2015 CLINICAL DATA:  LEFT-sided numbness beginning yesterday, initially attributed to medication. Assess for stroke. History of hypertension. EXAM: MRI HEAD WITHOUT CONTRAST MRA HEAD WITHOUT CONTRAST TECHNIQUE: Multiplanar, multiecho pulse sequences of the brain and surrounding structures were obtained without intravenous contrast. Angiographic images of the head were obtained using MRA technique without contrast. COMPARISON:  CT HEAD December 28, 2015 at 1028 hours FINDINGS: MRI HEAD FINDINGS BRAIN: 9 mm ovoid focus of reduced diffusion RIGHT thalamus with low ADC values. No susceptibility artifact to suggest hemorrhage. The ventricles and sulci are normal for patient's age. Patchy to confluent supratentorial pontine white matter FLAIR T2 hyperintensities. No suspicious parenchymal signal, masses or mass effect. Numerous RIGHT greater than LEFT basal ganglia perivascular spaces. No abnormal extra-axial fluid collections. No extra-axial masses though, contrast enhanced sequences would be more sensitive. VASCULAR: Normal major intracranial vascular flow voids present  at skull base. SKULL AND UPPER CERVICAL SPINE: No abnormal sellar expansion. No suspicious calvarial bone marrow signal. Craniocervical junction maintained. SINUSES/ORBITS: Chronic RIGHT maxillary sinusitis. LEFT sphenoethmoidal mucosal thickening. Trace mastoid effusions. Status post RIGHT ocular lens implant. The included ocular globes and  orbital contents are non-suspicious. OTHER: None. MRA HEAD FINDINGS ANTERIOR CIRCULATION: Normal flow related enhancement of the included cervical, petrous, cavernous and supraclinoid internal carotid arteries. Patent anterior communicating artery. Normal flow related enhancement of the anterior and middle cerebral arteries, including distal segments. Supernumerary anterior cerebral artery arising from LEFT A1-2 junction. Mild luminal regularity. No large vessel occlusion, high-grade stenosis, aneurysm. POSTERIOR CIRCULATION: LEFT vertebral artery is dominant. Basilar artery is patent, with normal flow related enhancement of the main branch vessels. Normal flow related enhancement of the posterior cerebral arteries. Mild luminal regularity. No large vessel occlusion, high-grade stenosis, aneurysm. IMPRESSION: MRI HEAD: Acute sub cm RIGHT thalamic nonhemorrhagic infarct. Moderate to severe chronic small vessel ischemic disease. MRA HEAD: No emergent large vessel occlusion or high-grade stenosis. Mild intracranial atherosclerosis. Electronically Signed   By: Awilda Metro M.D.   On: 12/28/2015 19:01   US Abdomen Limited Ruq  Result Date: 12/28/2015 CLINICAL DATA:  Nausea vomiting and diarrhea. EXAM: US ABDOMEN LIMITED - RIGHT UPPER QUADRANT COMPARISON:  Ultrasound abdomen and CT abdomen 12/23/2015 FINDINGS: Gallbladder: Multiple gallstones measuring up to 15 mm. Gallbladder sludge. Negative sonographic Murphy sign. Gallbladder wall not thickened 1.2 mm Common bile duct: Diameter: Common bile duct 7.5 mm which has enlarged since the prior study. Normal LFT today. Liver: Calcified granuloma left lobe of the liver. No liver mass. No ascites IMPRESSION: Cholelithiasis without evidence of cholecystitis Mild biliary ductal dilatation. Common bile duct 7.5 mm compared with 5 mm on the prior ultrasound. Normal LFT today. Electronically Signed   By: Marlan Palau M.D.   On: 12/28/2015 15:34    PHYSICAL  EXAM General: elderly female, NAD HEENT: [place]/atr Lungs: ctab Heart: RRR, no m/rg Neurological Exam : : Awake alert oriented 3.. Normal speech. Able to follow commands. Extraocular moments are full range without nystagmus. Fundi were not visualized. Vision acuity and fields are adequate. No facial droop. Tongue is midline. Motor system exam reveals no upper or lower extremity drift. No focal weakness. Has some decreased sensation on left arm. Otherwise normal elsewhere. Normal figner to nose exam. DTR 2+ and symmetric.    ASSESSMENT/PLAN Ms. MARIELIS SAMARA is a 82 y.o. female with history of HTN, GERD, HLD presenting with left arm numbness. She did not receive IV t-PA due to mild symptoms  Acute sub CM right thalamic nonhemorrhagic infarct secondary to small vessel disease source  Resultant  Left arm numbness  MRI  Acute sub cm RIGHT thalamic nonhemorrhagic infarct  MRA  emergent large vessel occlusion or high-grade stenosis.Mild intracranial atherosclerosis.  Carotid Doppler  pending  2D Echo  pending  LDL 105  HgbA1c pending  lovenox for VTE prophylaxis  Diet regular Room service appropriate? Yes; Fluid consistency: Thin  No antithrombotic prior to admission, now on aspirin 325 mg daily  Patient counseled to be compliant with her antithrombotic medications  Ongoing aggressive stroke risk factor management  Therapy recommendations:  CIR  Disposition:  pending  Hypertension  Stable. Hold antihypertensives.  Permissive hypertension (OK if < 220/120) but gradually normalize in 5-7 days  Long-term BP goal normotensive  Hyperlipidemia  Home meds: crestor 5 mg, resumed in hospital  LDL 105, goal < 70  Consider increasing crestor to 20mg  daily.  Other Stroke Risk Factors  Advanced age  Other Active Problems  Diarrhea  Mild hyponatremia  Hospital day # 0  Tasrif Ahmed, M.D. PGY-3 I have personally examined this patient, reviewed notes, independently viewed  imaging studies, participated in medical decision making and plan of care.ROS completed by me personally and pertinent positives fully documented  I have made any additions or clarifications directly to the above note. Agree with note above. She presented with left sided numbness secondary to small right thalamic lacunar infarct due to small vessel disease. Recommend aspirin 325 mg daily and continue ongoing stroke evaluation. Follow-up as an outpatient in stroke clinic in 6 weeks. Discuss with Dr. Pincus BadderJessica Vonn. Greater than 50% time during this 25 minute visit was spent on counseling and coordination of care about stroke risk, prevention and treatment  Delia HeadyPramod Sethi, MD Medical Director [facility] Pager: [phone] 12/29/2015 4:11 PM

## 2015-12-29 NOTE — Evaluation (Signed)
Speech Language Pathology Evaluation Patient Details Name: [name] MRN: [id number] DOB: 11/16/1933 Today's Date: 12/29/2015 Time: [id number] SLP Time Calculation (min) (ACUTE ONLY): 16 min  Problem List:  Patient Active Problem List   Diagnosis Date Noted  . CVA (cerebral vascular accident) (HCC) 12/29/2015  . Left sided numbness 12/28/2015  . Nausea & vomiting 12/28/2015  . Diarrhea 12/28/2015  . HTN (hypertension) 12/28/2015  . Acute encephalopathy 12/23/2015  . UTI (urinary tract infection) 12/23/2015  . Dehydration with hyponatremia 12/23/2015   Past Medical History:  Past Medical History:  Diagnosis Date  . Acute ischemic stroke (HCC) 12/28/2015   [name] 12/28/2015  . Arthritis    "hands" (12/28/2015)  . GERD (gastroesophageal reflux disease)   . High cholesterol   . Hypertension   . PONV (postoperative nausea and vomiting)    "w/1st hip replacement"   Past Surgical History:  Past Surgical History:  Procedure Laterality Date  . CATARACT EXTRACTION W/ INTRAOCULAR LENS IMPLANT Right ~ 2011  . JOINT REPLACEMENT    . TOTAL HIP ARTHROPLASTY  2007-2008   left-right   HPI:  Kristen SettleJenie S Clarkis a 82 y.o.femalewith medical history significant for anxiety, hypertension. Recent admission for hyponatremia with dehydration, presumed UTI and nausea with vomiting without definitive etiology although cholelithiasis noted on CT the abdomen. Pt then came to ED on 11/28 with c/o L sided numbness, MRI revealed R subacute non-hemorrhagic thalamic infarct.   Assessment / Plan / Recommendation Clinical Impression  Cognitstat administed and patient scoring WNL for all areas of communication and cognitive abilities. No further SLP needs indicated.     SLP Assessment  Patient does not need any further Speech Lanaguage Pathology Services    Follow Up Recommendations  None          SLP Evaluation Cognition  Overall Cognitive Status: Within Functional Limits for tasks assessed       Comprehension  Auditory Comprehension Overall Auditory Comprehension: Appears within functional limits for tasks assessed Visual Recognition/Discrimination Discrimination: Within Function Limits Reading Comprehension Reading Status: Within funtional limits    Expression Expression Primary Mode of Expression: Verbal Verbal Expression Overall Verbal Expression: Appears within functional limits for tasks assessed Written Expression Dominant Hand: Right   Oral / Motor  Oral Motor/Sensory Function Overall Oral Motor/Sensory Function:  (left sided tingling/numbness) Motor Speech Overall Motor Speech: Appears within functional limits for tasks assessed   GO            Ferdinand LangoLeah Tore Carreker MA, CCC-SLP [phone]         Liza Czerwinski Meryl 12/29/2015, 10:01 AM

## 2015-12-29 NOTE — Progress Notes (Signed)
  Echocardiogram 2D Echocardiogram has been performed.  Leta JunglingCooper, [place] M 12/29/2015, 3:13 PM

## 2015-12-29 NOTE — Progress Notes (Signed)
PROGRESS NOTE    Kristen DarkJenie S Popelka  ZOX:[id number] DOB: 01/01/1934 DOA: 12/28/2015 [facility]   Outpatient Specialists:     Brief Narrative:  [name] is a 82 y.o. female with medical history significant for anxiety, hypertension. Recent admission for hyponatremia with dehydration, presumed UTI and nausea with vomiting without definitive etiology although cholelithiasis noted on CT the abdomen. Since discharge patient's had persistent issues with ongoing nausea and vomiting for which she has taken Zofran. She also reports she was taking a pain pill since discharge although there is no prescribed pain medications listed on her medication reconciliation from recent discharge. Patient poor set yesterday about 1 or after taking her nausea medication (and ? dose of unknown pain medicine) she began experiencing left-sided numbness. She denied weakness, visual disturbances or difficulty understanding words or speaking. She reports symptoms involve the left side of the face, left arm and left leg without any ataxia or imbalance. She did not come to the hospital because she felt the symptoms were related to side effects of the medication she had been taking. In addition to these new symptoms she has now developed watery diarrhea without abdominal pain or blood. She's not had any fevers or chills since coming home. She states she has fallen at least one time since coming home. She reports she has abdominal ultrasound ordered for next week. He reports poor oral intake and has eaten very little since discharge.   Assessment & Plan:   Principal Problem:   Left sided numbness Active Problems:   Dehydration with hyponatremia   Nausea & vomiting   Diarrhea   HTN (hypertension)   CVA (cerebral vascular accident) (HCC)   CVA/Left sided numbness -MRI done: Acute sub cm RIGHT thalamic nonhemorrhagic infarct -Echocardiogram -Carotid duplex -LDL: 105 -HgbA1C  pending -SLP/PT/OT evaluation- CIR -Antiplatelet with aspirin -neuro consult    Dehydration with hyponatremia -Patient presents with hyponatremia and reported poor oral intake and new watery diarrhea -Previous admission hyponatremia suspected related to hydrochlorothiazide which has subsequently been discontinued    Nausea & vomiting -eating now -consider HIDA if starts vomiting -no RUQ     Diarrhea -Reports watery diarrhea since discharge and patient did receive 3 doses of Rocephin during previous hospitalization -Check C. difficile PCR   HTN (hypertension) -Poorly controlled -On Norvasc and Tenormin at home with HCTZ being discontinued previous admission due to hyponatremia -While NPO utilize IV hydralazine and Lopressor      DVT prophylaxis:  Lovenox   Code Status: DNR   Family Communication: patient  Disposition Plan:     Consultants:   neuro     Subjective: No SOB/no CP Hungry-- wants to eat  Objective: Vitals:   12/29/15 0214 12/29/15 0512 12/29/15 1000 12/29/15 1311  BP: (!) 105/43 (!) 126/52 98/76 (!) 147/54  Pulse: 61 68 72 79  Resp: 16 16 16 20   Temp: 98.1 F (36.7 C) 97.5 F (36.4 C) 98 F (36.7 C) 98.4 F (36.9 C)  TempSrc: Oral Axillary Oral Oral  SpO2: 96% 100% 99% 99%  Weight:      Height:        Intake/Output Summary (Last 24 hours) at 12/29/15 1454 Last data filed at 12/29/15 1323  Gross per 24 hour  Intake              915 ml  Output              150 ml  Net  765 ml   Filed Weights   12/28/15 0902  Weight: 48.1 kg (106 lb)    Examination:  General exam: Appears calm and comfortable  Respiratory system: Clear to auscultation. Respiratory effort normal. Cardiovascular system: S1 & S2 heard, RRR. No JVD, murmurs, rubs, gallops or clicks. No pedal edema. Gastrointestinal system: Abdomen is nondistended, soft and nontender. No organomegaly or masses felt. Normal bowel sounds heard. Central nervous  system: Alert and oriented. No focal neurological deficits.     Data Reviewed: I have personally reviewed following labs and imaging studies  CBC:  Recent Labs Lab 12/23/15 1332 12/24/15 0516 12/28/15 0918 12/28/15 0923 12/29/15 0459  WBC 6.2 5.3 5.2  --  4.9  NEUTROABS  --   --  3.0  --   --   HGB 13.9 11.5* 14.2 13.3 12.8  HCT 36.8 31.9* 39.2 39.0 35.6*  MCV 82.7 84.6 84.1  --  86.0  PLT 184 147* 272  --  251   Basic Metabolic Panel:  Recent Labs Lab 12/24/15 0516 12/24/15 1250 12/24/15 2104 12/25/15 0422 12/28/15 0918 12/28/15 0923 12/29/15 0459  NA 133* 132* 135 135 130* 130* 134*  K 2.9* 3.5 3.3* 3.3* 3.8 3.8 3.6  CL 99* 98* 100* 102 95* 93* 101  CO2 24 26 27 25 25   --  22  GLUCOSE 101* 114* 121* 116* 123* 121* 94  BUN 5* 7 7 7  5* 4* 10  CREATININE 0.63 0.63 0.66 0.55 0.72 0.60 0.86  CALCIUM 8.3* 8.4* 8.9 8.7* 9.3  --  8.6*  MG 2.0  --   --   --   --   --   --   PHOS 3.1  --   --   --   --   --   --    GFR: Estimated Creatinine Clearance (by C-G formula based on SCr of 0.86 mg/dL) Female: 78.237.5 mL/min Female: 46.6 mL/min Liver Function Tests:  Recent Labs Lab 12/23/15 1332 12/24/15 0516 12/28/15 0918 12/29/15 0459  AST 27 21 29 21   ALT 15 11* 18 14  ALKPHOS 62 45 54 48  BILITOT 1.3* 1.0 1.0 1.3*  PROT 6.4* 5.3* 6.4* 5.6*  ALBUMIN 4.1 3.2* 4.0 3.3*    Recent Labs Lab 12/23/15 1332  LIPASE 47   No results for input(s): AMMONIA in the last 168 hours. Coagulation Profile:  Recent Labs Lab 12/28/15 0918  INR 0.96   Cardiac Enzymes: No results for input(s): CKTOTAL, CKMB, CKMBINDEX, TROPONINI in the last 168 hours. BNP (last 3 results) No results for input(s): PROBNP in the last 8760 hours. HbA1C: No results for input(s): HGBA1C in the last 72 hours. CBG:  Recent Labs Lab 12/28/15 0930  GLUCAP 123*   Lipid Profile:  Recent Labs  12/29/15 0459  CHOL 175  HDL 36*  LDLCALC 105*  TRIG 171*  CHOLHDL 4.9   Thyroid Function  Tests: No results for input(s): TSH, T4TOTAL, FREET4, T3FREE, THYROIDAB in the last 72 hours. Anemia Panel: No results for input(s): VITAMINB12, FOLATE, FERRITIN, TIBC, IRON, RETICCTPCT in the last 72 hours. Urine analysis:    Component Value Date/Time   COLORURINE YELLOW 12/23/2015 1607   APPEARANCEUR CLOUDY (A) 12/23/2015 1607   LABSPEC 1.025 12/23/2015 1607   PHURINE 6.5 12/23/2015 1607   GLUCOSEU NEGATIVE 12/23/2015 1607   HGBUR NEGATIVE 12/23/2015 1607   BILIRUBINUR SMALL (A) 12/23/2015 1607   KETONESUR 15 (A) 12/23/2015 1607   PROTEINUR 30 (A) 12/23/2015 1607   NITRITE POSITIVE (A)  12/23/2015 1607   LEUKOCYTESUR TRACE (A) 12/23/2015 1607     ) Recent Results (from the past 240 hour(s))  Urine culture     Status: Abnormal   Collection Time: 12/23/15  4:07 PM  Result Value Ref Range Status   Specimen Description URINE, CLEAN CATCH  Final   Special Requests ADDED [phone]  Final   Culture MULTIPLE SPECIES PRESENT, SUGGEST RECOLLECTION (A)  Final   Report Status 12/25/2015 FINAL  Final      Anti-infectives    None       Radiology Studies: Ct Head Wo Contrast  Result Date: 12/28/2015 CLINICAL DATA:  Left facial numbness, left arm numbness EXAM: CT HEAD WITHOUT CONTRAST TECHNIQUE: Contiguous axial images were obtained from the base of the skull through the vertex without intravenous contrast. COMPARISON:  None. FINDINGS: Brain: The ventricular system is prominent as are the cortical sulci and frontal CSF spaces consistent with diffuse atrophy. The septum is midline in position. The fourth ventricle and basilar cisterns are unremarkable. There is moderately severe small vessel ischemic change throughout the periventricular white matter. A left subcortical infarct cannot be excluded in the parietal region. MRI would be helpful if more sensitive assessment is warranted. No hemorrhage, or mass lesion is seen. Vascular: No vascular abnormality is seen on this unenhanced  study. Skull: No calvarial lesion is noted. Sinuses/Orbits: However, there is complete opacification of the right maxillary sinus with partial opacification of ethmoid air cells and the left sphenoid sinus consistent with diffuse sinus disease. The left sphenoid sinus may represent chronic sinusitis with somewhat thickened walls. Other: None IMPRESSION: 1. Moderate atrophy and small vessel ischemic change. Cannot exclude acute or subacute subcortical infarct in the left parietal region. Consider MRI of the brain. 2. Sinusitis involving the right maxillary sinus , ethmoid air cells, and the left sphenoid sinus some of which may be chronic. Electronically Signed   By: Dwyane Dee M.D.   On: 12/28/2015 10:42   Mr Maxine Glenn Head Wo Contrast  Result Date: 12/28/2015 CLINICAL DATA:  LEFT-sided numbness beginning yesterday, initially attributed to medication. Assess for stroke. History of hypertension. EXAM: MRI HEAD WITHOUT CONTRAST MRA HEAD WITHOUT CONTRAST TECHNIQUE: Multiplanar, multiecho pulse sequences of the brain and surrounding structures were obtained without intravenous contrast. Angiographic images of the head were obtained using MRA technique without contrast. COMPARISON:  CT HEAD December 28, 2015 at 1028 hours FINDINGS: MRI HEAD FINDINGS BRAIN: 9 mm ovoid focus of reduced diffusion RIGHT thalamus with low ADC values. No susceptibility artifact to suggest hemorrhage. The ventricles and sulci are normal for patient's age. Patchy to confluent supratentorial pontine white matter FLAIR T2 hyperintensities. No suspicious parenchymal signal, masses or mass effect. Numerous RIGHT greater than LEFT basal ganglia perivascular spaces. No abnormal extra-axial fluid collections. No extra-axial masses though, contrast enhanced sequences would be more sensitive. VASCULAR: Normal major intracranial vascular flow voids present at skull base. SKULL AND UPPER CERVICAL SPINE: No abnormal sellar expansion. No suspicious calvarial  bone marrow signal. Craniocervical junction maintained. SINUSES/ORBITS: Chronic RIGHT maxillary sinusitis. LEFT sphenoethmoidal mucosal thickening. Trace mastoid effusions. Status post RIGHT ocular lens implant. The included ocular globes and orbital contents are non-suspicious. OTHER: None. MRA HEAD FINDINGS ANTERIOR CIRCULATION: Normal flow related enhancement of the included cervical, petrous, cavernous and supraclinoid internal carotid arteries. Patent anterior communicating artery. Normal flow related enhancement of the anterior and middle cerebral arteries, including distal segments. Supernumerary anterior cerebral artery arising from LEFT A1-2 junction. Mild luminal regularity. No large  vessel occlusion, high-grade stenosis, aneurysm. POSTERIOR CIRCULATION: LEFT vertebral artery is dominant. Basilar artery is patent, with normal flow related enhancement of the main branch vessels. Normal flow related enhancement of the posterior cerebral arteries. Mild luminal regularity. No large vessel occlusion, high-grade stenosis, aneurysm. IMPRESSION: MRI HEAD: Acute sub cm RIGHT thalamic nonhemorrhagic infarct. Moderate to severe chronic small vessel ischemic disease. MRA HEAD: No emergent large vessel occlusion or high-grade stenosis. Mild intracranial atherosclerosis. Electronically Signed   By: Awilda Metro M.D.   On: 12/28/2015 19:01   Mr Brain Wo Contrast  Result Date: 12/28/2015 CLINICAL DATA:  LEFT-sided numbness beginning yesterday, initially attributed to medication. Assess for stroke. History of hypertension. EXAM: MRI HEAD WITHOUT CONTRAST MRA HEAD WITHOUT CONTRAST TECHNIQUE: Multiplanar, multiecho pulse sequences of the brain and surrounding structures were obtained without intravenous contrast. Angiographic images of the head were obtained using MRA technique without contrast. COMPARISON:  CT HEAD December 28, 2015 at 1028 hours FINDINGS: MRI HEAD FINDINGS BRAIN: 9 mm ovoid focus of reduced  diffusion RIGHT thalamus with low ADC values. No susceptibility artifact to suggest hemorrhage. The ventricles and sulci are normal for patient's age. Patchy to confluent supratentorial pontine white matter FLAIR T2 hyperintensities. No suspicious parenchymal signal, masses or mass effect. Numerous RIGHT greater than LEFT basal ganglia perivascular spaces. No abnormal extra-axial fluid collections. No extra-axial masses though, contrast enhanced sequences would be more sensitive. VASCULAR: Normal major intracranial vascular flow voids present at skull base. SKULL AND UPPER CERVICAL SPINE: No abnormal sellar expansion. No suspicious calvarial bone marrow signal. Craniocervical junction maintained. SINUSES/ORBITS: Chronic RIGHT maxillary sinusitis. LEFT sphenoethmoidal mucosal thickening. Trace mastoid effusions. Status post RIGHT ocular lens implant. The included ocular globes and orbital contents are non-suspicious. OTHER: None. MRA HEAD FINDINGS ANTERIOR CIRCULATION: Normal flow related enhancement of the included cervical, petrous, cavernous and supraclinoid internal carotid arteries. Patent anterior communicating artery. Normal flow related enhancement of the anterior and middle cerebral arteries, including distal segments. Supernumerary anterior cerebral artery arising from LEFT A1-2 junction. Mild luminal regularity. No large vessel occlusion, high-grade stenosis, aneurysm. POSTERIOR CIRCULATION: LEFT vertebral artery is dominant. Basilar artery is patent, with normal flow related enhancement of the main branch vessels. Normal flow related enhancement of the posterior cerebral arteries. Mild luminal regularity. No large vessel occlusion, high-grade stenosis, aneurysm. IMPRESSION: MRI HEAD: Acute sub cm RIGHT thalamic nonhemorrhagic infarct. Moderate to severe chronic small vessel ischemic disease. MRA HEAD: No emergent large vessel occlusion or high-grade stenosis. Mild intracranial atherosclerosis.  Electronically Signed   By: Awilda Metro M.D.   On: 12/28/2015 19:01   US Abdomen Limited Ruq  Result Date: 12/28/2015 CLINICAL DATA:  Nausea vomiting and diarrhea. EXAM: US ABDOMEN LIMITED - RIGHT UPPER QUADRANT COMPARISON:  Ultrasound abdomen and CT abdomen 12/23/2015 FINDINGS: Gallbladder: Multiple gallstones measuring up to 15 mm. Gallbladder sludge. Negative sonographic Murphy sign. Gallbladder wall not thickened 1.2 mm Common bile duct: Diameter: Common bile duct 7.5 mm which has enlarged since the prior study. Normal LFT today. Liver: Calcified granuloma left lobe of the liver. No liver mass. No ascites IMPRESSION: Cholelithiasis without evidence of cholecystitis Mild biliary ductal dilatation. Common bile duct 7.5 mm compared with 5 mm on the prior ultrasound. Normal LFT today. Electronically Signed   By: Marlan Palau M.D.   On: 12/28/2015 15:34        Scheduled Meds: .  stroke: mapping our early stages of recovery book   Does not apply Once  . aspirin  300 mg Rectal  Daily   Or  . aspirin  325 mg Oral Daily  . enoxaparin (LOVENOX) injection  40 mg Subcutaneous Q24H  . rosuvastatin  5 mg Oral q1800   Continuous Infusions:   LOS: 0 days    Time spent: 25 min    Artemus Romanoff U Ran Tullis, DO [facility] Pager [phone]  If 7PM-7AM, please contact night-coverage [email or web address] Password [email or web address] 12/29/2015, 2:54 PM

## 2015-12-29 NOTE — PMR Pre-admission (Signed)
PMR Admission Coordinator Pre-Admission Assessment  Patient: [name] is an 82 y.o., female MRN: [id number] DOB: 12/19/1933 Height: 5' (152.4 cm) Weight: 48.1 kg (106 lb)              Insurance Information HMO:     PPO:      PCP:      IPA:      80/20:      OTHER:  PRIMARY: Medicare A & B      Policy#: [id number]      Subscriber: Self CM Name:       Phone#:      Fax#:  Pre-Cert#:       Employer:  Benefits:  Phone #:      Name:  Eff. Date: A & B 03/30/2000     Deduct: $1,316      Out of Pocket Max: None      Life Max: None CIR: 100%      SNF: 100% days 1-20; 80% days 21-100 Outpatient: PT/OT 80%    Co-Pay: 20% Home Health: PT/OT 100%    Co-Pay: $0 DME: 80%     Co-Pay: 20%  Providers: patient's choice   SECONDARY: BCBS Supplement       Policy#: [id number]      Subscriber: Self CM Name:       Phone#:      Fax#:  Pre-Cert#:       Employer: Retired Benefits:  Phone #:  [phone]    Name:  Eff. Date:      Deduct:       Out of Pocket Max:       Life Max:  CIR:       SNF:  Outpatient:      Co-Pay:  Home Health:       Co-Pay:  DME:      Co-Pay:   TERTIARY: [employer]      Policy#: [id number]: [id number]      Subscriber: Self CM Name:       Phone#:      Fax#:  Pre-Cert#:       Employer: Retired Benefits:  Phone #: [phone]     Name:  Eff. Date:      Deduct:       Out of Pocket Max:       Life Max:  CIR:       SNF:  Outpatient:      Co-Pay:  Home Health:       Co-Pay:  DME:      Co-Pay:   Medicaid Application Date:       Case Manager:  Disability Application Date:       Case Worker:   Emergency Contact Information Contact Information    Name Relation Home Work Mobile   Overton,Wendy Daughter [phone]       Current Medical History  Patient Admitting Diagnosis: Right thalamic infarct with left hemisensory deficits and left hemisensory ataxia  History of Present Illness: Kristen SettleJenie S Clarkis a 82 y.o.right handed femalewith history of hypertension, hyperlipidemia.Per chart review  patient lives alone and was independent prior to admission. One level home with 4 steps to entry.Recent admission for hyponatremia with dehydration presumed secondary to UTI. Presented 11/28/2017with left-sided numbness.Sodium 130, troponin negative. CT/MRI showed acute right thalamic nonhemorrhagic infarct. Moderate to severe chronic small vessel ischemic disease. MRA with no emergent large vessel occlusion or stenosis. Ultrasound the abdomen showed cholelithiasis without evidence of cholecystitis. Patient did not receive TPA. Carotid Dopplers with no ICA stenosis.  Echocardiogram is pending. Aspirin for CVA prophylaxis. Subcutaneous Lovenox for DVT prophylaxis. Tolerating a regular diet.Patient with bouts of loose stool with C. difficile specimens negative. Physical and occupational therapy evaluations completed with recommendations of physical medicine rehabilitation consult. Patient was admitted for comprehensive rehabilitation program.   NIH Total: 2    Past Medical History  Past Medical History:  Diagnosis Date  . Acute ischemic stroke (HCC) 12/28/2015   [name] 12/28/2015  . Arthritis    "hands" (12/28/2015)  . GERD (gastroesophageal reflux disease)   . High cholesterol   . Hypertension   . PONV (postoperative nausea and vomiting)    "w/1st hip replacement"    Family History  family history includes Alcohol abuse in her brother; CAD in her brother and father; Stroke in her brother.  Prior Rehab/Hospitalizations:  Has the patient had major surgery during 100 days prior to admission? No  Current Medications   Current Facility-Administered Medications:  .   stroke: mapping our early stages of recovery book, , Does not apply, Once, Russella Dar, NP .  aspirin suppository 300 mg, 300 mg, Rectal, Daily **OR** aspirin tablet 325 mg, 325 mg, Oral, Daily, Russella Dar, NP, 325 mg at 12/30/15 0951 .  enoxaparin (LOVENOX) injection 40 mg, 40 mg, Subcutaneous, Q24H, Russella Dar,  NP, 40 mg at 12/29/15 1715 .  hydrALAZINE (APRESOLINE) injection 10 mg, 10 mg, Intravenous, Q4H PRN, Russella Dar, NP .  latanoprost (XALATAN) 0.005 % ophthalmic solution 1 drop, 1 drop, Both Eyes, QHS, [name], NP, 1 drop at 12/30/15 0140 .  loperamide (IMODIUM) capsule 2 mg, 2 mg, Oral, PRN, Joseph Art, DO .  metoprolol (LOPRESSOR) injection 5 mg, 5 mg, Intravenous, Q4H PRN, Russella Dar, NP .  rosuvastatin (CRESTOR) tablet 5 mg, 5 mg, Oral, q1800, Joseph Art, DO, 5 mg at 12/29/15 1715  Patients Current Diet: Diet regular Room service appropriate? Yes; Fluid consistency: Thin Diet - low sodium heart healthy  Precautions / Restrictions Precautions Precautions: Fall Precaution Comments: L side numbness  Restrictions Weight Bearing Restrictions: No   Has the patient had 2 or more falls or a fall with injury in the past year?Yes, 2 falls per her report and last one was 2 weeks ago and she was injured (suspect cracked rib) but did not seek medical attention.    Prior Activity Level Community (5-7x/wk): Prior to admission patient was active daily with home management, yard work, involved with church choir, and volunteered in her community.     Home Assistive Devices / Equipment Home Assistive Devices/Equipment: Eyeglasses, Blood pressure cuff, Cane (specify quad or straight), Walker (specify type) Home Equipment: Walker - 2 wheels, Cane - single point  Prior Device Use: Indicate devices/aids used by the patient prior to current illness, exacerbation or injury? Patient reports using a walking stick when she was outside PTA.  Prior Functional Level Prior Function Level of Independence: Independent Comments: reports she's been using RW since last hospital stay last week  Self Care: Did the patient need help bathing, dressing, using the toilet or eating?  Independent  Indoor Mobility: Did the patient need assistance with walking from room to room (with or without  device)? Independent  Stairs: Did the patient need assistance with internal or external stairs (with or without device)? Independent  Functional Cognition: Did the patient need help planning regular tasks such as shopping or remembering to take medications? Independent  Current Functional Level Cognition  Overall Cognitive Status: Within  Functional Limits for tasks assessed Orientation Level: Oriented X4 Following Commands: Follows multi-step commands with increased time General Comments: Pt with lack of awareness to deficits. pt expresses changes in vision and numbness L UE / face. Pt needs cues for L side    Extremity Assessment (includes Sensation/Coordination)  Upper Extremity Assessment: LUE deficits/detail LUE Deficits / Details: numbness, 4 out 5 grossly. pt appears to have good motor input with sensory loss. Pt have WFL shoulder elbow wrist and digit ROM LUE Sensation: decreased light touch LUE Coordination: decreased fine motor, decreased gross motor  Lower Extremity Assessment: Defer to PT evaluation LLE Deficits / Details: reports sensation is deficit than R but it is not numb LLE Sensation: decreased light touch (50% deficit)    ADLs  Overall ADL's : Needs assistance/impaired Eating/Feeding: Independent Eating/Feeding Details (indicate cue type and reason): able to open all containers and packages with SLP  Grooming: Wash/dry face, Supervision/safety, Sitting Grooming Details (indicate cue type and reason): incr (A) required with standing. pt needs (A) for steady due to balance deficits Lower Body Bathing: Sitting/lateral leans, Minimal assistance Lower Body Bathing Details (indicate cue type and reason): able to adjust L sock Toilet Transfer: Moderate assistance Toilet Transfer Details (indicate cue type and reason): pt very petite in size so one person (A) provided. pt with L LE coordination deficits and requires (A) for steady General ADL Comments: pt upright in chair  on arrival. pt c/o visual changes and L UE sensation changes    Mobility  General bed mobility comments: in chair on arrival    Transfers  Overall transfer level: Needs assistance Equipment used: 1 person hand held assist Transfers: Sit to/from Stand Sit to Stand: Min assist Stand pivot transfers: Mod assist General transfer comment: v/c's for safe hand placement    Ambulation / Gait / Stairs / Wheelchair Mobility  Ambulation/Gait Ambulation/Gait assistance: Min assist, Mod assist Ambulation Distance (Feet): 100 Feet Assistive device: Rolling walker (2 wheeled), 1 person hand held assist Gait Pattern/deviations: Step-through pattern, Staggering left, Staggering right, Wide base of support General Gait Details: pt amb 52' with RW, pt min guard but noted vearing to the L. pt then attempted to amb without RW and pt very unsteady requiring modA to maintain stability. pt became to feel whoozy requring max encouragement to maintain eye opening Gait velocity: slow Gait velocity interpretation: Below normal speed for age/gender    Posture / Balance Balance Overall balance assessment: Needs assistance Sitting-balance support: Feet supported Sitting balance-Leahy Scale: Fair Standing balance support: During functional activity Standing balance-Leahy Scale: Poor Standing balance comment: needs external support    Special needs/care consideration BiPAP/CPAP: No CPM: No Continuous Drip IV: No Dialysis: No         Life Vest: No Oxygen: No Special Bed: No Trach Size: No Wound Vac (area): No       Skin: WDL                               Bowel mgmt: 12/28/15 patient reports stools are more formed  Bladder mgmt: Continent  Diabetic mgmt: No     Previous Home Environment Living Arrangements: Alone Available Help at Discharge: Family, Available PRN/intermittently Type of Home: House Home Layout: One level Home Access: Stairs to enter Entrance Stairs-Rails: Can reach both Entrance  Stairs-Number of Steps: 4 Bathroom Shower/Tub: Engineer, manufacturing systems: Pharmacist, community: Yes Home Care Services: No  Discharge Living Setting Plans  for Discharge Living Setting: Patient's home (rental ) Type of Home at Discharge: House Discharge Home Layout: One level Discharge Home Access: Stairs to enter Entrance Stairs-Rails: Right, Left (cannot reach both ) Entrance Stairs-Number of Steps: 3-4 Discharge Bathroom Shower/Tub: Other (comment) (garden tub; "bird bath" PTA due to being scared of tub ) Discharge Bathroom Toilet: Standard Discharge Bathroom Accessibility: Yes How Accessible: Accessible via walker Does the patient have any problems obtaining your medications?: No  Social/Family/Support Systems Patient Roles: Parent Contact Information: Daughter: Ignacia MarvelWendy Overton [phone] Anticipated Caregiver: Daughter  Anticipated Caregiver's Contact Information: [phone] [phone] Ability/Limitations of Caregiver: None she is retired but lives 20 minutes away from mother  Caregiver Availability: Intermittent Discharge Plan Discussed with Primary Caregiver: Yes Is Caregiver In Agreement with Plan?: Yes Does Caregiver/Family have Issues with Lodging/Transportation while Pt is in Rehab?: No  Goals/Additional Needs Patient/Family Goal for Rehab: PT/OT Mod I-Supervision  Expected length of stay: 7-10 days  Cultural Considerations: [employer]  Dietary Needs: Regular and thin  Equipment Needs: TBD Special Service Needs: None Additional Information: None Pt/Family Agrees to Admission and willing to participate: Yes Program Orientation Provided & Reviewed with Pt/Caregiver Including Roles  & Responsibilities: Yes Additional Information Needs: None Information Needs to be Provided By: N/A  Decrease burden of Care through IP rehab admission: No   Possible need for SNF placement upon discharge: No   Patient Condition: This patient's condition remains as documented  in the consult dated 12/29/15, in which the Rehabilitation Physician determined and documented that the patient's condition is appropriate for intensive rehabilitative care in an inpatient rehabilitation facility. Will admit to inpatient rehab today.   Preadmission Screen Completed By:  Fae PippinMelissa Jadian Karman, 12/30/2015 10:12 AM ______________________________________________________________________   Discussed status with Dr. Wynn BankerKirsteins on 12/30/15 at 1012 and received telephone approval for admission today.  Admission Coordinator:  Fae PippinMelissa Lazariah Savard, time 1012/Date 12/30/15

## 2015-12-29 NOTE — Evaluation (Signed)
Occupational Therapy Evaluation Patient Details Name: [name] MRN: [id number] DOB: 05/19/1933 Today's Date: 12/29/2015    History of Present Illness Kristen SettleJenie S Clarkis a 82 y.o.femalewith medical history significant for anxiety, hypertension. Recent admission for hyponatremia with dehydration, presumed UTI and nausea with vomiting without definitive etiology although cholelithiasis noted on CT the abdomen. 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   Clinical Impression   PT admitted with R thalamic infarct. Pt currently with functional limitiations due to the deficits listed below (see OT problem list). PTA was independent with all adls and iadls. Pt now with balance and visual changes.  Pt will benefit from skilled OT to increase their independence and safety with adls and balance to allow discharge CIR.      Follow Up Recommendations  CIR    Equipment Recommendations  Other (comment) (defer to next venue)    Recommendations for Other Services Rehab consult     Precautions / Restrictions Precautions Precautions: Fall Precaution Comments: L side numbness  Restrictions Weight Bearing Restrictions: No      Mobility Bed Mobility               General bed mobility comments: in chair on arrival  Transfers Overall transfer level: Needs assistance Equipment used: 1 person hand held assist Transfers: Sit to/from Stand Sit to Stand: Min assist Stand pivot transfers: Mod assist       General transfer comment: v/c's for safe hand placement    Balance Overall balance assessment: Needs assistance Sitting-balance support: Feet supported Sitting balance-Leahy Scale: Fair     Standing balance support: During functional activity Standing balance-Leahy Scale: Poor Standing balance comment: needs external support                            ADL Overall ADL's : Needs  assistance/impaired Eating/Feeding: Independent Eating/Feeding Details (indicate cue type and reason): able to open all containers and packages with SLP  Grooming: Wash/dry face;Supervision/safety;Sitting Grooming Details (indicate cue type and reason): incr (A) required with standing. pt needs (A) for steady due to balance deficits     Lower Body Bathing: Sitting/lateral leans;Minimal assistance Lower Body Bathing Details (indicate cue type and reason): able to adjust L sock         Toilet Transfer: Moderate assistance Toilet Transfer Details (indicate cue type and reason): pt very petite in size so one person (A) provided. pt with L LE coordination deficits and requires (A) for steady           General ADL Comments: pt upright in chair on arrival. pt c/o visual changes and L UE sensation changes     Vision Vision Assessment?: Yes Eye Alignment: Within Functional Limits Ocular Range of Motion: Impaired-to be further tested in functional context Alignment/Gaze Preference: Within Defined Limits Tracking/Visual Pursuits: Requires cues, head turns, or add eye shifts to track;Impaired - to be further tested in functional context Saccades: Overshoots Additional Comments: L eye occluded- with R eye being assessed reports L upper quadrant dots and holes ; pt with past surg with summer for R eye cataract removal. R eye occluded with L eye assessed- reports cloudy and blurry with pending cataract removal. Pt with decr scanning L peripheral field with overshooting/ eye jumping and searching for object   Perception     Praxis      Pertinent Vitals/Pain Pain Assessment: No/denies  pain     Hand Dominance Right   Extremity/Trunk Assessment Upper Extremity Assessment Upper Extremity Assessment: LUE deficits/detail LUE Deficits / Details: numbness, 4 out 5 grossly. pt appears to have good motor input with sensory loss. Pt have WFL shoulder elbow wrist and digit ROM LUE Sensation:  decreased light touch LUE Coordination: decreased fine motor;decreased gross motor   Lower Extremity Assessment Lower Extremity Assessment: Defer to PT evaluation LLE Deficits / Details: reports sensation is deficit than R but it is not numb LLE Sensation: decreased light touch (50% deficit)   Cervical / Trunk Assessment Cervical / Trunk Assessment: Normal   Communication Communication Communication: No difficulties   Cognition Arousal/Alertness: Awake/alert Behavior During Therapy: WFL for tasks assessed/performed Overall Cognitive Status: Impaired/Different from baseline Area of Impairment: Awareness       Following Commands: Follows multi-step commands with increased time   Awareness: Emergent Problem Solving: Slow processing General Comments: Pt with lack of awareness to deficits. pt expresses changes in vision and numbness L UE / face. Pt needs cues for L side   General Comments       Exercises       Shoulder Instructions      Home Living Family/patient expects to be discharged to:: Inpatient rehab Living Arrangements: Alone Available Help at Discharge: Family;Available PRN/intermittently Type of Home: House Home Access: Stairs to enter Entergy CorporationEntrance Stairs-Number of Steps: 4 Entrance Stairs-Rails: Can reach both Home Layout: One level     Bathroom Shower/Tub: Chief Strategy OfficerTub/shower unit   Bathroom Toilet: Standard Bathroom Accessibility: Yes   Home Equipment: Environmental consultantWalker - 2 wheels;Cane - single point          Prior Functioning/Environment Level of Independence: Independent        Comments: reports she's been using RW since last hospital stay last week        OT Problem List: Decreased strength;Decreased range of motion;Decreased activity tolerance;Impaired balance (sitting and/or standing);Decreased safety awareness;Decreased knowledge of use of DME or AE;Decreased knowledge of precautions;Impaired UE functional use;Impaired sensation   OT Treatment/Interventions:  Self-care/ADL training;Therapeutic exercise;Neuromuscular education;DME and/or AE instruction;Therapeutic activities;Cognitive remediation/compensation;Visual/perceptual remediation/compensation;Patient/family education;Balance training    OT Goals(Current goals can be found in the care plan section) Acute Rehab OT Goals Patient Stated Goal: to return home OT Goal Formulation: With patient Time For Goal Achievement: 01/12/16 Potential to Achieve Goals: Good  OT Frequency: Min 3X/week   Barriers to D/C: Decreased caregiver support  lives alone but has a sister       Co-evaluation              End of Session Equipment Utilized During Treatment: Gait belt Nurse Communication: Mobility status;Precautions  Activity Tolerance: Patient tolerated treatment well Patient left: in chair;with chair alarm set;with call bell/phone within reach   Time: 0919-0930 OT Time Calculation (min): 11 min Charges:  OT General Charges $OT Visit: 1 Procedure OT Evaluation $OT Eval Moderate Complexity: 1 Procedure G-Codes:    Boone MasterJones, Benay Pomeroy B 12/29/2015, 9:49 AM  Mateo FlowJones, Brynn   OTR/L Pager: [phone]: [phone] Office: [phone] .

## 2015-12-29 NOTE — Discharge Summary (Signed)
Physician Discharge Summary  [name] ZOX:[id number] DOB: [birth year] DOA: 12/28/2015  PCP: [facility]  Admit date: 12/28/2015 Discharge date: 12/29/2015   Recommendations for Outpatient Follow-Up:   1. To CIR 2. DNR   Discharge Diagnosis:   Principal Problem:   Left sided numbness Active Problems:   Dehydration with hyponatremia   Nausea & vomiting   Diarrhea   HTN (hypertension)   CVA (cerebral vascular accident) [facility])   Discharge disposition:  Home  Discharge Condition: Improved.  Diet recommendation: Low sodium, heart healthy.  Carbohydrate-modified  Wound care: None.   History of Present Illness:   [name] is a 82 y.o. female with medical history significant for anxiety, hypertension. Recent admission for hyponatremia with dehydration, presumed UTI and nausea with vomiting without definitive etiology although cholelithiasis noted on CT the abdomen. Since discharge patient's had persistent issues with ongoing nausea and vomiting for which she has taken Zofran. She also reports she was taking a pain pill since discharge although there is no prescribed pain medications listed on her medication reconciliation from recent discharge. Patient poor set yesterday about 1 or after taking her nausea medication (and ? dose of unknown pain medicine) she began experiencing left-sided numbness. She denied weakness, visual disturbances or difficulty understanding words or speaking. She reports symptoms involve the left side of the face, left arm and left leg without any ataxia or imbalance. She did not come to the hospital because she felt the symptoms were related to side effects of the medication she had been taking. In addition to these new symptoms she has now developed watery diarrhea without abdominal pain or blood. She's not had any fevers or chills since coming home. She states she has fallen at least one time since coming home. She  reports she has abdominal ultrasound ordered for next week. He reports poor oral intake and has eaten very little since discharge.   Hospital Course by Problem:   CVA/Left sided numbness -MRI done: Acute sub cm RIGHT thalamic nonhemorrhagic infarct -Echocardiogram: done -Carotid duplex -LDL: 105 -HgbA1C 5.1 -SLP/PT/OT evaluation- CIR -Antiplatelet with aspirin -neuro consult appreciated  Dehydration with hyponatremia -Patient presents with hyponatremia and reported poor oral intake and new watery diarrhea -Previous admission hyponatremia suspected related to hydrochlorothiazide which has subsequently been discontinued -c diff negative -follow up with GI outpatient  Nausea &vomiting -eating now  Diarrhea -Reports watery diarrhea since discharge and patient did receive 3 doses of Rocephin during previous hospitalization -C. difficile PCR negative  HTN (hypertension) -Poorly controlled -On Norvasc and Tenormin at home with HCTZ being discontinued previous admission due to hyponatremia -titrate as needed       Medical Consultants:    Neuro   Discharge Exam:   Vitals:   12/29/15 1000 12/29/15 1311  BP: 98/76 (!) 147/54  Pulse: 72 79  Resp: 16 20  Temp: 98 F (36.7 C) 98.4 F (36.9 C)   Vitals:   12/29/15 0214 12/29/15 0512 12/29/15 1000 12/29/15 1311  BP: (!) 105/43 (!) 126/52 98/76 (!) 147/54  Pulse: 61 68 72 79  Resp: 16 16 16 20   Temp: 98.1 F (36.7 C) 97.5 F (36.4 C) 98 F (36.7 C) 98.4 F (36.9 C)  TempSrc: Oral Axillary Oral Oral  SpO2: 96% 100% 99% 99%  Weight:      Height:        Gen:  NAD    The results of significant diagnostics from this hospitalization (including imaging, microbiology, ancillary  and laboratory) are listed below for reference.     Procedures and Diagnostic Studies:   Ct Head Wo Contrast  Result Date: 12/28/2015 CLINICAL DATA:  Left facial numbness, left arm numbness EXAM: CT HEAD WITHOUT  CONTRAST TECHNIQUE: Contiguous axial images were obtained from the base of the skull through the vertex without intravenous contrast. COMPARISON:  None. FINDINGS: Brain: The ventricular system is prominent as are the cortical sulci and frontal CSF spaces consistent with diffuse atrophy. The septum is midline in position. The fourth ventricle and basilar cisterns are unremarkable. There is moderately severe small vessel ischemic change throughout the periventricular white matter. A left subcortical infarct cannot be excluded in the parietal region. MRI would be helpful if more sensitive assessment is warranted. No hemorrhage, or mass lesion is seen. Vascular: No vascular abnormality is seen on this unenhanced study. Skull: No calvarial lesion is noted. Sinuses/Orbits: However, there is complete opacification of the right maxillary sinus with partial opacification of ethmoid air cells and the left sphenoid sinus consistent with diffuse sinus disease. The left sphenoid sinus may represent chronic sinusitis with somewhat thickened walls. Other: None IMPRESSION: 1. Moderate atrophy and small vessel ischemic change. Cannot exclude acute or subacute subcortical infarct in the left parietal region. Consider MRI of the brain. 2. Sinusitis involving the right maxillary sinus , ethmoid air cells, and the left sphenoid sinus some of which may be chronic. Electronically Signed   By: Dwyane DeePaul  Barry M.D.   On: 12/28/2015 10:42   Mr Kristen GlennMra Head Wo Contrast  Result Date: 12/28/2015 CLINICAL DATA:  LEFT-sided numbness beginning yesterday, initially attributed to medication. Assess for stroke. History of hypertension. EXAM: MRI HEAD WITHOUT CONTRAST MRA HEAD WITHOUT CONTRAST TECHNIQUE: Multiplanar, multiecho pulse sequences of the brain and surrounding structures were obtained without intravenous contrast. Angiographic images of the head were obtained using MRA technique without contrast. COMPARISON:  CT HEAD December 28, 2015 at 1028  hours FINDINGS: MRI HEAD FINDINGS BRAIN: 9 mm ovoid focus of reduced diffusion RIGHT thalamus with low ADC values. No susceptibility artifact to suggest hemorrhage. The ventricles and sulci are normal for patient's age. Patchy to confluent supratentorial pontine white matter FLAIR T2 hyperintensities. No suspicious parenchymal signal, masses or mass effect. Numerous RIGHT greater than LEFT basal ganglia perivascular spaces. No abnormal extra-axial fluid collections. No extra-axial masses though, contrast enhanced sequences would be more sensitive. VASCULAR: Normal major intracranial vascular flow voids present at skull base. SKULL AND UPPER CERVICAL SPINE: No abnormal sellar expansion. No suspicious calvarial bone marrow signal. Craniocervical junction maintained. SINUSES/ORBITS: Chronic RIGHT maxillary sinusitis. LEFT sphenoethmoidal mucosal thickening. Trace mastoid effusions. Status post RIGHT ocular lens implant. The included ocular globes and orbital contents are non-suspicious. OTHER: None. MRA HEAD FINDINGS ANTERIOR CIRCULATION: Normal flow related enhancement of the included cervical, petrous, cavernous and supraclinoid internal carotid arteries. Patent anterior communicating artery. Normal flow related enhancement of the anterior and middle cerebral arteries, including distal segments. Supernumerary anterior cerebral artery arising from LEFT A1-2 junction. Mild luminal regularity. No large vessel occlusion, high-grade stenosis, aneurysm. POSTERIOR CIRCULATION: LEFT vertebral artery is dominant. Basilar artery is patent, with normal flow related enhancement of the main branch vessels. Normal flow related enhancement of the posterior cerebral arteries. Mild luminal regularity. No large vessel occlusion, high-grade stenosis, aneurysm. IMPRESSION: MRI HEAD: Acute sub cm RIGHT thalamic nonhemorrhagic infarct. Moderate to severe chronic small vessel ischemic disease. MRA HEAD: No emergent large vessel occlusion  or high-grade stenosis. Mild intracranial atherosclerosis. Electronically Signed   By:  Awilda Metro M.D.   On: 12/28/2015 19:01   Mr Brain Wo Contrast  Result Date: 12/28/2015 CLINICAL DATA:  LEFT-sided numbness beginning yesterday, initially attributed to medication. Assess for stroke. History of hypertension. EXAM: MRI HEAD WITHOUT CONTRAST MRA HEAD WITHOUT CONTRAST TECHNIQUE: Multiplanar, multiecho pulse sequences of the brain and surrounding structures were obtained without intravenous contrast. Angiographic images of the head were obtained using MRA technique without contrast. COMPARISON:  CT HEAD December 28, 2015 at 1028 hours FINDINGS: MRI HEAD FINDINGS BRAIN: 9 mm ovoid focus of reduced diffusion RIGHT thalamus with low ADC values. No susceptibility artifact to suggest hemorrhage. The ventricles and sulci are normal for patient's age. Patchy to confluent supratentorial pontine white matter FLAIR T2 hyperintensities. No suspicious parenchymal signal, masses or mass effect. Numerous RIGHT greater than LEFT basal ganglia perivascular spaces. No abnormal extra-axial fluid collections. No extra-axial masses though, contrast enhanced sequences would be more sensitive. VASCULAR: Normal major intracranial vascular flow voids present at skull base. SKULL AND UPPER CERVICAL SPINE: No abnormal sellar expansion. No suspicious calvarial bone marrow signal. Craniocervical junction maintained. SINUSES/ORBITS: Chronic RIGHT maxillary sinusitis. LEFT sphenoethmoidal mucosal thickening. Trace mastoid effusions. Status post RIGHT ocular lens implant. The included ocular globes and orbital contents are non-suspicious. OTHER: None. MRA HEAD FINDINGS ANTERIOR CIRCULATION: Normal flow related enhancement of the included cervical, petrous, cavernous and supraclinoid internal carotid arteries. Patent anterior communicating artery. Normal flow related enhancement of the anterior and middle cerebral arteries, including  distal segments. Supernumerary anterior cerebral artery arising from LEFT A1-2 junction. Mild luminal regularity. No large vessel occlusion, high-grade stenosis, aneurysm. POSTERIOR CIRCULATION: LEFT vertebral artery is dominant. Basilar artery is patent, with normal flow related enhancement of the main branch vessels. Normal flow related enhancement of the posterior cerebral arteries. Mild luminal regularity. No large vessel occlusion, high-grade stenosis, aneurysm. IMPRESSION: MRI HEAD: Acute sub cm RIGHT thalamic nonhemorrhagic infarct. Moderate to severe chronic small vessel ischemic disease. MRA HEAD: No emergent large vessel occlusion or high-grade stenosis. Mild intracranial atherosclerosis. Electronically Signed   By: Awilda Metro M.D.   On: 12/28/2015 19:01   US Abdomen Limited Ruq  Result Date: 12/28/2015 CLINICAL DATA:  Nausea vomiting and diarrhea. EXAM: US ABDOMEN LIMITED - RIGHT UPPER QUADRANT COMPARISON:  Ultrasound abdomen and CT abdomen 12/23/2015 FINDINGS: Gallbladder: Multiple gallstones measuring up to 15 mm. Gallbladder sludge. Negative sonographic Murphy sign. Gallbladder wall not thickened 1.2 mm Common bile duct: Diameter: Common bile duct 7.5 mm which has enlarged since the prior study. Normal LFT today. Liver: Calcified granuloma left lobe of the liver. No liver mass. No ascites IMPRESSION: Cholelithiasis without evidence of cholecystitis Mild biliary ductal dilatation. Common bile duct 7.5 mm compared with 5 mm on the prior ultrasound. Normal LFT today. Electronically Signed   By: Marlan Palau M.D.   On: 12/28/2015 15:34     Labs:   Basic Metabolic Panel:  Recent Labs Lab 12/24/15 0516 12/24/15 1250 12/24/15 2104 12/25/15 0422 12/28/15 0918 12/28/15 0923 12/29/15 0459  NA 133* 132* 135 135 130* 130* 134*  K 2.9* 3.5 3.3* 3.3* 3.8 3.8 3.6  CL 99* 98* 100* 102 95* 93* 101  CO2 24 26 27 25 25   --  22  GLUCOSE 101* 114* 121* 116* 123* 121* 94  BUN 5* 7 7 7  5*  4* 10  CREATININE 0.63 0.63 0.66 0.55 0.72 0.60 0.86  CALCIUM 8.3* 8.4* 8.9 8.7* 9.3  --  8.6*  MG 2.0  --   --   --   --   --   --  PHOS 3.1  --   --   --   --   --   --    GFR Estimated Creatinine Clearance (by C-G formula based on SCr of 0.86 mg/dL) Female: 16.137.5 mL/min Female: 46.6 mL/min Liver Function Tests:  Recent Labs Lab 12/23/15 1332 12/24/15 0516 12/28/15 0918 12/29/15 0459  AST 27 21 29 21   ALT 15 11* 18 14  ALKPHOS 62 45 54 48  BILITOT 1.3* 1.0 1.0 1.3*  PROT 6.4* 5.3* 6.4* 5.6*  ALBUMIN 4.1 3.2* 4.0 3.3*    Recent Labs Lab 12/23/15 1332  LIPASE 47   No results for input(s): AMMONIA in the last 168 hours. Coagulation profile  Recent Labs Lab 12/28/15 0918  INR 0.96    CBC:  Recent Labs Lab 12/23/15 1332 12/24/15 0516 12/28/15 0918 12/28/15 0923 12/29/15 0459  WBC 6.2 5.3 5.2  --  4.9  NEUTROABS  --   --  3.0  --   --   HGB 13.9 11.5* 14.2 13.3 12.8  HCT 36.8 31.9* 39.2 39.0 35.6*  MCV 82.7 84.6 84.1  --  86.0  PLT 184 147* 272  --  251   Cardiac Enzymes: No results for input(s): CKTOTAL, CKMB, CKMBINDEX, TROPONINI in the last 168 hours. BNP: Invalid input(s): POCBNP CBG:  Recent Labs Lab 12/28/15 0930  GLUCAP 123*   D-Dimer No results for input(s): DDIMER in the last 72 hours. Hgb A1c No results for input(s): HGBA1C in the last 72 hours. Lipid Profile  Recent Labs  12/29/15 0459  CHOL 175  HDL 36*  LDLCALC 105*  TRIG 171*  CHOLHDL 4.9   Thyroid function studies No results for input(s): TSH, T4TOTAL, T3FREE, THYROIDAB in the last 72 hours.  Invalid input(s): FREET3 Anemia work up No results for input(s): VITAMINB12, FOLATE, FERRITIN, TIBC, IRON, RETICCTPCT in the last 72 hours. Microbiology Recent Results (from the past 240 hour(s))  Urine culture     Status: Abnormal   Collection Time: 12/23/15  4:07 PM  Result Value Ref Range Status   Specimen Description URINE, CLEAN CATCH  Final   Special Requests ADDED  [id number] [phone]  Final   Culture MULTIPLE SPECIES PRESENT, SUGGEST RECOLLECTION (A)  Final   Report Status 12/25/2015 FINAL  Final  C difficile quick scan w PCR reflex     Status: None   Collection Time: 12/28/15  1:09 PM  Result Value Ref Range Status   C Diff antigen NEGATIVE NEGATIVE Final   C Diff toxin NEGATIVE NEGATIVE Final   C Diff interpretation No C. difficile detected.  Final     Discharge Instructions:   Discharge Instructions    Diet - low sodium heart healthy    Complete by:  As directed    Discharge instructions    Complete by:  As directed    DNR   Increase activity slowly    Complete by:  As directed        Medication List    TAKE these medications   amLODipine 5 MG tablet Commonly known as:  NORVASC Take 5 mg by mouth daily.   aspirin 325 MG tablet Take 1 tablet (325 mg total) by mouth daily. Start taking on:  12/30/2015   atenolol 50 MG tablet Commonly known as:  TENORMIN Take 50 mg by mouth daily.   latanoprost 0.005 % ophthalmic solution Commonly known as:  XALATAN Place 1 drop into both eyes at bedtime.   ondansetron 4 MG tablet Commonly known as:  ZOFRAN  Take 1 tablet (4 mg total) by mouth every 6 (six) hours as needed for nausea.   rosuvastatin 5 MG tablet Commonly known as:  CRESTOR Take 1 tablet (5 mg total) by mouth daily at 6 PM.      Follow-up Information    [facility] Follow up in 1 week(s).   Specialty:  Family Medicine Contact information: [address] [place] [address] [phone]        SETHI,PRAMOD, MD Follow up in 6 week(s).   Specialties:  Neurology, Radiology Contact information: [address] [place] [address] [phone]            Time coordinating discharge: 35 min  Signed:  Joseph Art   [facility] 12/29/2015, 4:06 PM

## 2015-12-29 NOTE — Progress Notes (Signed)
Rehab Admissions Coordinator Note:  Patient was screened by Trish MageLogue, Britini Garcilazo M for appropriateness for an Inpatient Acute Rehab Consult.  At this time, we are recommending Inpatient Rehab consult.  Trish MageLogue, Sherrika Weakland M 12/29/2015, 10:03 AM  I can be reached at [phone].

## 2015-12-29 NOTE — Progress Notes (Signed)
*  PRELIMINARY RESULTS* Vascular Ultrasound Carotid Duplex (Doppler) has been completed.  Preliminary findings: Bilateral: No significant (1-39%) ICA stenosis. Antegrade vertebral flow.   Farrel DemarkJill Eunice, RDMS, RVT  12/29/2015, 3:07 PM

## 2015-12-29 NOTE — Consult Note (Signed)
Physical Medicine and Rehabilitation Consult Reason for Consult: Right subacute nonhemorrhagic thalamic infarct Referring Physician: [facility]   HPI: [name] is a 82 y.o. right handed female with history of hypertension, hyperlipidemia. Per chart review patient lives alone and was independent  prior to admission. One level home with 4 steps to entry. Recent admission for hyponatremia with dehydration presumed secondary to UTI. Presented 12/28/2015 with left-sided numbness. CT/MRI showed acute right thalamic nonhemorrhagic infarct. Moderate to severe chronic small vessel ischemic disease. MRA with no emergent large vessel occlusion or stenosis. Ultrasound the abdomen showed cholelithiasis without evidence of cholecystitis. Patient did not receive TPA. Echocardiogram is pending. Aspirin for CVA prophylaxis. Subcutaneous Lovenox for DVT prophylaxis. Tolerating a regular diet.   Review of Systems  Constitutional: Negative for chills and fever.  HENT: Negative for hearing loss and tinnitus.   Eyes: Negative for blurred vision and double vision.  Respiratory: Negative for cough and shortness of breath.   Cardiovascular: Negative for chest pain, palpitations and leg swelling.  Gastrointestinal: Positive for diarrhea. Negative for nausea and vomiting.       GERD  Genitourinary: Negative for dysuria and hematuria.  Musculoskeletal: Positive for myalgias.  Skin: Negative for rash.  Neurological: Positive for dizziness and weakness. Negative for seizures and loss of consciousness.  All other systems reviewed and are negative.  Past Medical History:  Diagnosis Date  . Acute ischemic stroke (HCC) 12/28/2015   [name] 12/28/2015  . Arthritis    "hands" (12/28/2015)  . GERD (gastroesophageal reflux disease)   . High cholesterol   . Hypertension   . PONV (postoperative nausea and vomiting)    "w/1st hip replacement"   Past Surgical History:  Procedure Laterality Date  . CATARACT  EXTRACTION W/ INTRAOCULAR LENS IMPLANT Right ~ 2011  . JOINT REPLACEMENT    . TOTAL HIP ARTHROPLASTY  2007-2008   left-right   Family History  Problem Relation Age of Onset  . CAD Father   . Stroke Brother   . Alcohol abuse Brother   . CAD Brother    Social History:  reports that she has never smoked. She has never used smokeless tobacco. She reports that she does not drink alcohol or use drugs. Allergies:  Allergies  Allergen Reactions  . Ativan [Lorazepam]     altered  . Phenergan [Promethazine]     "Seeing things"     Medications Prior to Admission  Medication Sig Dispense Refill  . amLODipine (NORVASC) 5 MG tablet Take 5 mg by mouth daily.    Marland Kitchen atenolol (TENORMIN) 50 MG tablet Take 50 mg by mouth daily.    Marland Kitchen latanoprost (XALATAN) 0.005 % ophthalmic solution Place 1 drop into both eyes at bedtime.    . ondansetron (ZOFRAN) 4 MG tablet Take 1 tablet (4 mg total) by mouth every 6 (six) hours as needed for nausea. 20 tablet 0    Home: Home Living Family/patient expects to be discharged to:: Inpatient rehab Living Arrangements: Alone Available Help at Discharge: Family, Available PRN/intermittently Type of Home: House Home Access: Stairs to enter [employer] of Steps: 4 Entrance Stairs-Rails: Can reach both Home Layout: One level Bathroom Shower/Tub: Engineer, manufacturing systems: Standard Bathroom Accessibility: Yes Home Equipment: Environmental consultant - 2 wheels, Cane - single point  Functional History: Prior Function Level of Independence: Independent Comments: reports she's been using RW since last hospital stay last week Functional Status:  Mobility: Bed Mobility General bed mobility comments: in chair on arrival Transfers  Overall transfer level: Needs assistance Equipment used: 1 person hand held assist Transfers: Sit to/from Stand Sit to Stand: Min assist Stand pivot transfers: Mod assist General transfer comment: v/c's for safe hand  placement Ambulation/Gait Ambulation/Gait assistance: Min assist, Mod assist Ambulation Distance (Feet): 100 Feet Assistive device: Rolling walker (2 wheeled), 1 person hand held assist Gait Pattern/deviations: Step-through pattern, Staggering left, Staggering right, Wide base of support General Gait Details: pt amb 7950' with RW, pt min guard but noted vearing to the L. pt then attempted to amb without RW and pt very unsteady requiring modA to maintain stability. pt became to feel whoozy requring max encouragement to maintain eye opening Gait velocity: slow Gait velocity interpretation: Below normal speed for age/gender    ADL: ADL Overall ADL's : Needs assistance/impaired Eating/Feeding: Independent Eating/Feeding Details (indicate cue type and reason): able to open all containers and packages with SLP  Grooming: Wash/dry face, Supervision/safety, Sitting Grooming Details (indicate cue type and reason): incr (A) required with standing. pt needs (A) for steady due to balance deficits Lower Body Bathing: Sitting/lateral leans, Minimal assistance Lower Body Bathing Details (indicate cue type and reason): able to adjust L sock Toilet Transfer: Moderate assistance Toilet Transfer Details (indicate cue type and reason): pt very petite in size so one person (A) provided. pt with L LE coordination deficits and requires (A) for steady General ADL Comments: pt upright in chair on arrival. pt c/o visual changes and L UE sensation changes  Cognition: Cognition Overall Cognitive Status: Within Functional Limits for tasks assessed Orientation Level: Oriented X4 Cognition Arousal/Alertness: Awake/alert Behavior During Therapy: WFL for tasks assessed/performed Overall Cognitive Status: Within Functional Limits for tasks assessed Area of Impairment: Awareness Following Commands: Follows multi-step commands with increased time Awareness: Emergent Problem Solving: Slow processing General Comments:  Pt with lack of awareness to deficits. pt expresses changes in vision and numbness L UE / face. Pt needs cues for L side  Blood pressure 98/76, pulse 72, temperature 98 F (36.7 C), temperature source Oral, resp. rate 16, height 5' (1.524 m), weight 48.1 kg (106 lb), SpO2 99 %. Physical Exam  Constitutional: She is oriented to person, place, and time.  HENT:  Head: Normocephalic.  Eyes: EOM are normal.  Neck: Normal range of motion. Neck supple. No thyromegaly present.  Cardiovascular: Normal rate and regular rhythm.   Respiratory: Effort normal and breath sounds normal. No respiratory distress.  GI: Soft. Bowel sounds are normal. She exhibits no distension.  Neurological: She is alert and oriented to person, place, and time.  Follows commands  Skin: Skin is warm and dry.  Motor strength is 4/5 in the left deltoid, biceps, triceps, grip, 5/5 in the right deltoid, biceps, positive group. Decreased sensation to light touch. Left hemifacial left upper extremity. Left lower extremity is able to distinguish which finger or toe was touched however feels different than on the right side. Mild dysmetria, left finger-nose to finger   Results for orders placed or performed during the hospital encounter of 12/28/15 (from the past 24 hour(s))  Lipid panel     Status: Abnormal   Collection Time: 12/29/15  4:59 AM  Result Value Ref Range   Cholesterol 175 0 - 200 mg/dL   Triglycerides 098171 (H) <150 mg/dL   HDL 36 (L) >11>40 mg/dL   Total CHOL/HDL Ratio 4.9 RATIO   VLDL 34 0 - 40 mg/dL   LDL Cholesterol 914105 (H) 0 - 99 mg/dL  CBC     Status: Abnormal  Collection Time: 12/29/15  4:59 AM  Result Value Ref Range   WBC 4.9 4.0 - 10.5 K/uL   RBC 4.14 3.87 - 5.11 MIL/uL   Hemoglobin 12.8 12.0 - 15.0 g/dL   HCT 16.1 (L) 09.6 - 04.5 %   MCV 86.0 78.0 - 100.0 fL   MCH 30.9 26.0 - 34.0 pg   MCHC 36.0 30.0 - 36.0 g/dL   RDW 40.9 81.1 - 91.4 %   Platelets 251 150 - 400 K/uL  Comprehensive metabolic panel      Status: Abnormal   Collection Time: 12/29/15  4:59 AM  Result Value Ref Range   Sodium 134 (L) 135 - 145 mmol/L   Potassium 3.6 3.5 - 5.1 mmol/L   Chloride 101 101 - 111 mmol/L   CO2 22 22 - 32 mmol/L   Glucose, Bld 94 65 - 99 mg/dL   BUN 10 6 - 20 mg/dL   Creatinine, Ser 7.82 0.44 - 1.00 mg/dL   Calcium 8.6 (L) 8.9 - 10.3 mg/dL   Total Protein 5.6 (L) 6.5 - 8.1 g/dL   Albumin 3.3 (L) 3.5 - 5.0 g/dL   AST 21 15 - 41 U/L   ALT 14 14 - 54 U/L   Alkaline Phosphatase 48 38 - 126 U/L   Total Bilirubin 1.3 (H) 0.3 - 1.2 mg/dL   GFR calc non Af Amer >60 >60 mL/min   GFR calc Af Amer >60 >60 mL/min   Anion gap 11 5 - 15   Ct Head Wo Contrast  Result Date: 12/28/2015 CLINICAL DATA:  Left facial numbness, left arm numbness EXAM: CT HEAD WITHOUT CONTRAST TECHNIQUE: Contiguous axial images were obtained from the base of the skull through the vertex without intravenous contrast. COMPARISON:  None. FINDINGS: Brain: The ventricular system is prominent as are the cortical sulci and frontal CSF spaces consistent with diffuse atrophy. The septum is midline in position. The fourth ventricle and basilar cisterns are unremarkable. There is moderately severe small vessel ischemic change throughout the periventricular white matter. A left subcortical infarct cannot be excluded in the parietal region. MRI would be helpful if more sensitive assessment is warranted. No hemorrhage, or mass lesion is seen. Vascular: No vascular abnormality is seen on this unenhanced study. Skull: No calvarial lesion is noted. Sinuses/Orbits: However, there is complete opacification of the right maxillary sinus with partial opacification of ethmoid air cells and the left sphenoid sinus consistent with diffuse sinus disease. The left sphenoid sinus may represent chronic sinusitis with somewhat thickened walls. Other: None IMPRESSION: 1. Moderate atrophy and small vessel ischemic change. Cannot exclude acute or subacute subcortical  infarct in the left parietal region. Consider MRI of the brain. 2. Sinusitis involving the right maxillary sinus , ethmoid air cells, and the left sphenoid sinus some of which may be chronic. Electronically Signed   By: Dwyane Dee M.D.   On: 12/28/2015 10:42   Mr Maxine Glenn Head Wo Contrast  Result Date: 12/28/2015 CLINICAL DATA:  LEFT-sided numbness beginning yesterday, initially attributed to medication. Assess for stroke. History of hypertension. EXAM: MRI HEAD WITHOUT CONTRAST MRA HEAD WITHOUT CONTRAST TECHNIQUE: Multiplanar, multiecho pulse sequences of the brain and surrounding structures were obtained without intravenous contrast. Angiographic images of the head were obtained using MRA technique without contrast. COMPARISON:  CT HEAD December 28, 2015 at 1028 hours FINDINGS: MRI HEAD FINDINGS BRAIN: 9 mm ovoid focus of reduced diffusion RIGHT thalamus with low ADC values. No susceptibility artifact to suggest hemorrhage. The ventricles and  sulci are normal for patient's age. Patchy to confluent supratentorial pontine white matter FLAIR T2 hyperintensities. No suspicious parenchymal signal, masses or mass effect. Numerous RIGHT greater than LEFT basal ganglia perivascular spaces. No abnormal extra-axial fluid collections. No extra-axial masses though, contrast enhanced sequences would be more sensitive. VASCULAR: Normal major intracranial vascular flow voids present at skull base. SKULL AND UPPER CERVICAL SPINE: No abnormal sellar expansion. No suspicious calvarial bone marrow signal. Craniocervical junction maintained. SINUSES/ORBITS: Chronic RIGHT maxillary sinusitis. LEFT sphenoethmoidal mucosal thickening. Trace mastoid effusions. Status post RIGHT ocular lens implant. The included ocular globes and orbital contents are non-suspicious. OTHER: None. MRA HEAD FINDINGS ANTERIOR CIRCULATION: Normal flow related enhancement of the included cervical, petrous, cavernous and supraclinoid internal carotid arteries.  Patent anterior communicating artery. Normal flow related enhancement of the anterior and middle cerebral arteries, including distal segments. Supernumerary anterior cerebral artery arising from LEFT A1-2 junction. Mild luminal regularity. No large vessel occlusion, high-grade stenosis, aneurysm. POSTERIOR CIRCULATION: LEFT vertebral artery is dominant. Basilar artery is patent, with normal flow related enhancement of the main branch vessels. Normal flow related enhancement of the posterior cerebral arteries. Mild luminal regularity. No large vessel occlusion, high-grade stenosis, aneurysm. IMPRESSION: MRI HEAD: Acute sub cm RIGHT thalamic nonhemorrhagic infarct. Moderate to severe chronic small vessel ischemic disease. MRA HEAD: No emergent large vessel occlusion or high-grade stenosis. Mild intracranial atherosclerosis. Electronically Signed   By: Awilda Metroourtnay  Bloomer M.D.   On: 12/28/2015 19:01   Mr Brain Wo Contrast  Result Date: 12/28/2015 CLINICAL DATA:  LEFT-sided numbness beginning yesterday, initially attributed to medication. Assess for stroke. History of hypertension. EXAM: MRI HEAD WITHOUT CONTRAST MRA HEAD WITHOUT CONTRAST TECHNIQUE: Multiplanar, multiecho pulse sequences of the brain and surrounding structures were obtained without intravenous contrast. Angiographic images of the head were obtained using MRA technique without contrast. COMPARISON:  CT HEAD December 28, 2015 at 1028 hours FINDINGS: MRI HEAD FINDINGS BRAIN: 9 mm ovoid focus of reduced diffusion RIGHT thalamus with low ADC values. No susceptibility artifact to suggest hemorrhage. The ventricles and sulci are normal for patient's age. Patchy to confluent supratentorial pontine white matter FLAIR T2 hyperintensities. No suspicious parenchymal signal, masses or mass effect. Numerous RIGHT greater than LEFT basal ganglia perivascular spaces. No abnormal extra-axial fluid collections. No extra-axial masses though, contrast enhanced sequences  would be more sensitive. VASCULAR: Normal major intracranial vascular flow voids present at skull base. SKULL AND UPPER CERVICAL SPINE: No abnormal sellar expansion. No suspicious calvarial bone marrow signal. Craniocervical junction maintained. SINUSES/ORBITS: Chronic RIGHT maxillary sinusitis. LEFT sphenoethmoidal mucosal thickening. Trace mastoid effusions. Status post RIGHT ocular lens implant. The included ocular globes and orbital contents are non-suspicious. OTHER: None. MRA HEAD FINDINGS ANTERIOR CIRCULATION: Normal flow related enhancement of the included cervical, petrous, cavernous and supraclinoid internal carotid arteries. Patent anterior communicating artery. Normal flow related enhancement of the anterior and middle cerebral arteries, including distal segments. Supernumerary anterior cerebral artery arising from LEFT A1-2 junction. Mild luminal regularity. No large vessel occlusion, high-grade stenosis, aneurysm. POSTERIOR CIRCULATION: LEFT vertebral artery is dominant. Basilar artery is patent, with normal flow related enhancement of the main branch vessels. Normal flow related enhancement of the posterior cerebral arteries. Mild luminal regularity. No large vessel occlusion, high-grade stenosis, aneurysm. IMPRESSION: MRI HEAD: Acute sub cm RIGHT thalamic nonhemorrhagic infarct. Moderate to severe chronic small vessel ischemic disease. MRA HEAD: No emergent large vessel occlusion or high-grade stenosis. Mild intracranial atherosclerosis. Electronically Signed   By: Awilda Metroourtnay  Bloomer M.D.   On: 12/28/2015  19:01   US Abdomen Limited Ruq  Result Date: 12/28/2015 CLINICAL DATA:  Nausea vomiting and diarrhea. EXAM: US ABDOMEN LIMITED - RIGHT UPPER QUADRANT COMPARISON:  Ultrasound abdomen and CT abdomen 12/23/2015 FINDINGS: Gallbladder: Multiple gallstones measuring up to 15 mm. Gallbladder sludge. Negative sonographic Murphy sign. Gallbladder wall not thickened 1.2 mm Common bile duct: Diameter:  Common bile duct 7.5 mm which has enlarged since the prior study. Normal LFT today. Liver: Calcified granuloma left lobe of the liver. No liver mass. No ascites IMPRESSION: Cholelithiasis without evidence of cholecystitis Mild biliary ductal dilatation. Common bile duct 7.5 mm compared with 5 mm on the prior ultrasound. Normal LFT today. Electronically Signed   By: Marlan Palau M.D.   On: 12/28/2015 15:34    Assessment/Plan: Diagnosis: Right thalamic infarct with left hemisensory deficits and left hemisensory ataxia 1. Does the need for close, 24 hr/day medical supervision in concert with the patient's rehab needs make it unreasonable for this patient to be served in a less intensive setting? Yes 2. Co-Morbidities requiring supervision/potential complications: Hypertension 3. Due to bladder management, bowel management, safety, skin/wound care, disease management, medication administration, pain management and patient education, does the patient require 24 hr/day rehab nursing? Yes 4. Does the patient require coordinated care of a physician, rehab nurse, PT (1-2 hrs/day, 5 days/week) and OT (1-2 hrs/day, 5 days/week) to address physical and functional deficits in the context of the above medical diagnosis(es)? Yes Addressing deficits in the following areas: balance, endurance, locomotion, strength, transferring, bowel/bladder control, bathing, dressing, feeding, grooming, toileting, cognition and psychosocial support 5. Can the patient actively participate in an intensive therapy program of at least 3 hrs of therapy per day at least 5 days per week? Yes 6. The potential for patient to make measurable gains while on inpatient rehab is excellent 7. Anticipated functional outcomes upon discharge from inpatient rehab are modified independent and supervision  with PT, modified independent and supervision with OT, n/a with SLP. 8. Estimated rehab length of stay to reach the above functional goals is: 7-10  days 9. Does the patient have adequate social supports and living environment to accommodate these discharge functional goals? Yes 10. Anticipated D/C setting: Home 11. Anticipated post D/C treatments: HH therapy 12. Overall Rehab/Functional Prognosis: excellent  RECOMMENDATIONS: This patient's condition is appropriate for continued rehabilitative care in the following setting: CIR Patient has agreed to participate in recommended program. Yes Note that insurance prior authorization may be required for reimbursement for recommended care.  Comment: These to complete inpatient workup, needs OT eval    12/29/2015

## 2015-12-29 NOTE — Progress Notes (Signed)
Inpatient Rehabilitation  Met with patient to discuss team's recommendation for IP Rehab.  Shared booklets and answered questions.  Patient eager to regain her independence and has daughter near by to support as well.  Called and spoke with daughter with patient's permission, who is also in agreement with plan for CIR.  Plan to follow for timing of medical readiness and bed availability.  Please call with questions.   Carmelia Roller., CCC/SLP Admission Coordinator  [facility]  Cell [phone]

## 2015-12-29 NOTE — Evaluation (Signed)
Clinical/Bedside Swallow Evaluation Patient Details  Name: [name] MRN: [id number] Date of Birth: 12/03/1933  Today's Date: 12/29/2015 Time: SLP Start Time (ACUTE ONLY): 78290925 SLP Stop Time (ACUTE ONLY): 0939 SLP Time Calculation (min) (ACUTE ONLY): 14 min  Past Medical History:  Past Medical History:  Diagnosis Date  . Acute ischemic stroke (HCC) 12/28/2015   [name] 12/28/2015  . Arthritis    "hands" (12/28/2015)  . GERD (gastroesophageal reflux disease)   . High cholesterol   . Hypertension   . PONV (postoperative nausea and vomiting)    "w/1st hip replacement"   Past Surgical History:  Past Surgical History:  Procedure Laterality Date  . CATARACT EXTRACTION W/ INTRAOCULAR LENS IMPLANT Right ~ 2011  . JOINT REPLACEMENT    . TOTAL HIP ARTHROPLASTY  2007-2008   left-right   HPI:  Kristen SettleJenie S Clarkis a 82 y.o.femalewith medical history significant for anxiety, hypertension. Recent admission for hyponatremia with dehydration, presumed UTI and nausea with vomiting without definitive etiology although cholelithiasis noted on CT the abdomen. Pt then came to ED on 11/28 with c/o L sided numbness, MRI revealed R subacute non-hemorrhagic thalamic infarct.   Assessment / Plan / Recommendation Clinical Impression  Patient presents with a normal oropharyngeal swallow with full airway protection. Left sided buccal numbness noted warranting use of lingual sweep to ensure buccal clearance. Educated patient who is able to carry out independently. No further SLP needs indicated. Note patient with c/o intermittent globus, likely consistent with reported h/o GER. Defer f/u to MD to address benefits of medications for GERD. Reviewed general esophageal precautions.     Aspiration Risk  No limitations    Diet Recommendation Regular;Thin liquid   Liquid Administration via: Cup;Straw Medication Administration: Whole meds with liquid Supervision: Patient able to self feed Compensations: Slow  rate;Small sips/bites;Follow solids with liquid Postural Changes: Seated upright at 90 degrees;Remain upright for at least 30 minutes after po intake    Other  Recommendations Oral Care Recommendations: Oral care BID   Follow up Recommendations None                 Swallow Study   General HPI: Kristen SettleJenie S Clarkis a 82 y.o.femalewith medical history significant for anxiety, hypertension. Recent admission for hyponatremia with dehydration, presumed UTI and nausea with vomiting without definitive etiology although cholelithiasis noted on CT the abdomen. Pt then came to ED on 11/28 with c/o L sided numbness, MRI revealed R subacute non-hemorrhagic thalamic infarct. Type of Study: Bedside Swallow Evaluation Previous Swallow Assessment: none Diet Prior to this Study: NPO Temperature Spikes Noted: No Respiratory Status: Room air History of Recent Intubation: No Behavior/Cognition: Alert;Cooperative;Pleasant mood Oral Cavity Assessment: Within Functional Limits Oral Care Completed by SLP: No Oral Cavity - Dentition: Adequate natural dentition Vision: Functional for self-feeding Self-Feeding Abilities: Able to feed self Patient Positioning: Upright in chair Baseline Vocal Quality: Normal Volitional Cough: Strong Volitional Swallow: Able to elicit    Oral/Motor/Sensory Function Overall Oral Motor/Sensory Function:  (left sided tingling/numbness)   Ice Chips Ice chips: Not tested   Thin Liquid Thin Liquid: Within functional limits Presentation: Cup;Self Fed;Straw    Nectar Thick Nectar Thick Liquid: Not tested   Honey Thick Honey Thick Liquid: Not tested   Puree Puree: Within functional limits Presentation: Self Fed;Spoon   Solid   GO  Leelyn Jasinski MA, CCC-SLP [phone]  Solid: Within functional limits Presentation: Self Fed        Angeles Zehner Meryl 12/29/2015,9:59 AM

## 2015-12-30 ENCOUNTER — Inpatient Hospital Stay (HOSPITAL_COMMUNITY)
Admission: RE | Admit: 2015-12-30 | Discharge: 2016-01-06 | DRG: 057 | Disposition: A | Discharge status: Home Health | Payer: Medicare Other | Source: Intra-hospital | Attending: Physical Medicine & Rehabilitation | Admitting: Physical Medicine & Rehabilitation

## 2015-12-30 DIAGNOSIS — K529 Noninfective gastroenteritis and colitis, unspecified: Secondary | ICD-10-CM

## 2015-12-30 DIAGNOSIS — G479 Sleep disorder, unspecified: Secondary | ICD-10-CM | POA: Diagnosis not present

## 2015-12-30 DIAGNOSIS — Z961 Presence of intraocular lens: Secondary | ICD-10-CM | POA: Diagnosis present

## 2015-12-30 DIAGNOSIS — Z9841 Cataract extraction status, right eye: Secondary | ICD-10-CM | POA: Diagnosis not present

## 2015-12-30 DIAGNOSIS — I639 Cerebral infarction, unspecified: Secondary | ICD-10-CM | POA: Diagnosis present

## 2015-12-30 DIAGNOSIS — Z96643 Presence of artificial hip joint, bilateral: Secondary | ICD-10-CM | POA: Diagnosis present

## 2015-12-30 DIAGNOSIS — I69398 Other sequelae of cerebral infarction: Secondary | ICD-10-CM | POA: Diagnosis not present

## 2015-12-30 DIAGNOSIS — Z79899 Other long term (current) drug therapy: Secondary | ICD-10-CM

## 2015-12-30 DIAGNOSIS — R197 Diarrhea, unspecified: Secondary | ICD-10-CM | POA: Diagnosis present

## 2015-12-30 DIAGNOSIS — I69393 Ataxia following cerebral infarction: Secondary | ICD-10-CM | POA: Diagnosis present

## 2015-12-30 DIAGNOSIS — I10 Essential (primary) hypertension: Secondary | ICD-10-CM | POA: Diagnosis present

## 2015-12-30 DIAGNOSIS — IMO0002 Reserved for concepts with insufficient information to code with codable children: Secondary | ICD-10-CM

## 2015-12-30 DIAGNOSIS — I6381 Other cerebral infarction due to occlusion or stenosis of small artery: Secondary | ICD-10-CM | POA: Diagnosis present

## 2015-12-30 DIAGNOSIS — Z888 Allergy status to other drugs, medicaments and biological substances status: Secondary | ICD-10-CM

## 2015-12-30 DIAGNOSIS — N179 Acute kidney failure, unspecified: Secondary | ICD-10-CM

## 2015-12-30 DIAGNOSIS — I69993 Ataxia following unspecified cerebrovascular disease: Secondary | ICD-10-CM | POA: Diagnosis not present

## 2015-12-30 DIAGNOSIS — E785 Hyperlipidemia, unspecified: Secondary | ICD-10-CM | POA: Diagnosis present

## 2015-12-30 DIAGNOSIS — K219 Gastro-esophageal reflux disease without esophagitis: Secondary | ICD-10-CM | POA: Diagnosis present

## 2015-12-30 DIAGNOSIS — G4700 Insomnia, unspecified: Secondary | ICD-10-CM | POA: Diagnosis present

## 2015-12-30 DIAGNOSIS — R209 Unspecified disturbances of skin sensation: Secondary | ICD-10-CM

## 2015-12-30 DIAGNOSIS — E871 Hypo-osmolality and hyponatremia: Secondary | ICD-10-CM | POA: Diagnosis present

## 2015-12-30 HISTORY — DX: Other cerebral infarction due to occlusion or stenosis of small artery: I63.81

## 2015-12-30 HISTORY — DX: Cerebral infarction, unspecified: I63.9

## 2015-12-30 LAB — CBC
HEMATOCRIT: 34.1 % — AB (ref 36.0–46.0)
Hemoglobin: 12.1 g/dL (ref 12.0–15.0)
MCH: 30.9 pg (ref 26.0–34.0)
MCHC: 35.5 g/dL (ref 30.0–36.0)
MCV: 87 fL (ref 78.0–100.0)
Platelets: 249 10*3/uL (ref 150–400)
RBC: 3.92 MIL/uL (ref 3.87–5.11)
RDW: 12.4 % (ref 11.5–15.5)
WBC: 5 10*3/uL (ref 4.0–10.5)

## 2015-12-30 LAB — ECHOCARDIOGRAM COMPLETE
HEIGHTINCHES: 60 in
WEIGHTICAEL: 1696 [oz_av]

## 2015-12-30 LAB — HEMOGLOBIN A1C
Hgb A1c MFr Bld: 5.1 % (ref 4.8–5.6)
MEAN PLASMA GLUCOSE: 100 mg/dL

## 2015-12-30 LAB — CREATININE, SERUM
Creatinine, Ser: 0.58 mg/dL (ref 0.44–1.00)
GFR calc Af Amer: >60 mL/min (ref >60)
GFR calc non Af Amer: >60 mL/min (ref >60)

## 2015-12-30 MED ORDER — LOPERAMIDE HCL 2 MG PO CAPS
2.0000 mg | ORAL_CAPSULE | ORAL | Status: DC | PRN
  Administered 2016-01-04: 2 mg via ORAL
  Filled 2015-12-30: qty 1

## 2015-12-30 MED ORDER — ENOXAPARIN SODIUM 30 MG/0.3ML ~~LOC~~ SOLN
30.0000 mg | SUBCUTANEOUS | Status: DC
  Administered 2015-12-30 – 2016-01-05 (×7): 30 mg via SUBCUTANEOUS
  Filled 2015-12-30 (×7): qty 0.3

## 2015-12-30 MED ORDER — ASPIRIN 325 MG PO TABS
325.0000 mg | ORAL_TABLET | Freq: Every day | ORAL | Status: DC
  Administered 2015-12-31 – 2016-01-06 (×7): 325 mg via ORAL
  Filled 2015-12-30 (×7): qty 1

## 2015-12-30 MED ORDER — ENOXAPARIN SODIUM 40 MG/0.4ML ~~LOC~~ SOLN: 40.0000 mg | SUBCUTANEOUS | Status: DC

## 2015-12-30 MED ORDER — LATANOPROST 0.005 % OP SOLN
1.0000 [drp] | Freq: Every day | OPHTHALMIC | Status: DC
  Administered 2015-12-30 – 2016-01-05 (×7): 1 [drp] via OPHTHALMIC
  Filled 2015-12-30: qty 2.5

## 2015-12-30 MED ORDER — LOPERAMIDE HCL 2 MG PO CAPS
2.0000 mg | ORAL_CAPSULE | ORAL | Status: DC | PRN
  Administered 2015-12-30: 2 mg via ORAL
  Filled 2015-12-30: qty 1

## 2015-12-30 MED ORDER — ROSUVASTATIN CALCIUM 10 MG PO TABS
5.0000 mg | ORAL_TABLET | Freq: Every day | ORAL | Status: DC
  Administered 2015-12-30 – 2016-01-05 (×7): 5 mg via ORAL
  Filled 2015-12-30 (×7): qty 1

## 2015-12-30 MED ORDER — ASPIRIN 300 MG RE SUPP
300.0000 mg | Freq: Every day | RECTAL | Status: DC
  Filled 2015-12-30 (×2): qty 1

## 2015-12-30 MED ORDER — ONDANSETRON HCL 4 MG/2ML IJ SOLN
4.0000 mg | Freq: Four times a day (QID) | INTRAMUSCULAR | Status: DC | PRN
  Administered 2016-01-02: 4 mg via INTRAVENOUS
  Filled 2015-12-30: qty 2

## 2015-12-30 MED ORDER — ONDANSETRON HCL 4 MG PO TABS
4.0000 mg | ORAL_TABLET | Freq: Four times a day (QID) | ORAL | Status: DC | PRN
Start: 2015-12-30 — End: 2016-01-06
  Administered 2015-12-31 – 2016-01-01 (×3): 4 mg via ORAL
  Filled 2015-12-30 (×3): qty 1

## 2015-12-30 MED ORDER — SORBITOL 70 % SOLN: 30.0000 mL | Freq: Every day | Status: DC | PRN

## 2015-12-30 NOTE — Progress Notes (Signed)
Patient information reviewed and entered into eRehab system by Riddick Nuon, RN, CRRN, PPS Coordinator.  Information including medical coding and functional independence measure will be reviewed and updated through discharge.     Per nursing patient was given "Data Collection Information Summary for Patients in Inpatient Rehabilitation Facilities with attached "Privacy Act Statement-Health Care Records" upon admission.  

## 2015-12-30 NOTE — Progress Notes (Signed)
Patient was informed about rehab process including patient safety plan and rehab booklet. 

## 2015-12-30 NOTE — Progress Notes (Signed)
STROKE TEAM PROGRESS NOTE   HISTORY OF PRESENT ILLNESS (per record)  In brief, this is an 82 year old woman who presents to emergency Department with complaints of left-sided numbness. Symptoms started more than 24 hours ago but she initially thought they were due to some recent medications that were started. When this persisted, she presented for evaluation. In the ED, she was noted to have sensory loss over the left arm and leg with more subjective sensory changes over the left side of her face. She has been admitted for stroke evaluation. There has been some concern for possible functional element to portions of the exam  Patient was not administered IV t-PA secondary to minimal symptoms.    SUBJECTIVE (INTERVAL HISTORY) Continues to have left sided numbness.but improving. Vitals stable overnight. Plan to DC to rehab today after echo   OBJECTIVE Temp:  [97.3 F (36.3 C)-98.2 F (36.8 C)] 98 F (36.7 C) (11/30 1438) Pulse Rate:  [73-107] 79 (11/30 1438) Cardiac Rhythm: Normal sinus rhythm (11/30 0700) Resp:  [20] 20 (11/30 1438) BP: (102-153)/(45-78) 140/46 (11/30 1438) SpO2:  [97 %-100 %] 98 % (11/30 1438) Weight:  [112 lb 6.4 oz (51 kg)] 112 lb 6.4 oz (51 kg) (11/30 1438)  CBC:   Recent Labs Lab 12/28/15 0918 12/28/15 0923 12/29/15 0459  WBC 5.2  --  4.9  NEUTROABS 3.0  --   --   HGB 14.2 13.3 12.8  HCT 39.2 39.0 35.6*  MCV 84.1  --  86.0  PLT 272  --  251    Basic Metabolic Panel:  Recent Labs Lab 12/24/15 0516  12/28/15 0918 12/28/15 0923 12/29/15 0459  NA 133*  < > 130* 130* 134*  K 2.9*  < > 3.8 3.8 3.6  CL 99*  < > 95* 93* 101  CO2 24  < > 25  --  22  GLUCOSE 101*  < > 123* 121* 94  BUN 5*  < > 5* 4* 10  CREATININE 0.63  < > 0.72 0.60 0.86  CALCIUM 8.3*  < > 9.3  --  8.6*  MG 2.0  --   --   --   --   PHOS 3.1  --   --   --   --   < > = values in this interval not displayed.  Lipid Panel:     Component Value Date/Time   CHOL 175 12/29/2015 0459    TRIG 171 (H) 12/29/2015 0459   HDL 36 (L) 12/29/2015 0459   CHOLHDL 4.9 12/29/2015 0459   VLDL 34 12/29/2015 0459   LDLCALC 105 (H) 12/29/2015 0459   HgbA1c:  Lab Results  Component Value Date   HGBA1C 5.1 12/29/2015   Urine Drug Screen: No results found for: LABOPIA, COCAINSCRNUR, LABBENZ, AMPHETMU, Abner GreenspanHCU, LABBARB    IMAGING  Mr Maxine GlennMra Head Wo Contrast  Result Date: 12/28/2015 CLINICAL DATA:  LEFT-sided numbness beginning yesterday, initially attributed to medication. Assess for stroke. History of hypertension. EXAM: MRI HEAD WITHOUT CONTRAST MRA HEAD WITHOUT CONTRAST TECHNIQUE: Multiplanar, multiecho pulse sequences of the brain and surrounding structures were obtained without intravenous contrast. Angiographic images of the head were obtained using MRA technique without contrast. COMPARISON:  CT HEAD December 28, 2015 at 1028 hours FINDINGS: MRI HEAD FINDINGS BRAIN: 9 mm ovoid focus of reduced diffusion RIGHT thalamus with low ADC values. No susceptibility artifact to suggest hemorrhage. The ventricles and sulci are normal for patient's age. Patchy to confluent supratentorial pontine white matter FLAIR T2 hyperintensities. No  suspicious parenchymal signal, masses or mass effect. Numerous RIGHT greater than LEFT basal ganglia perivascular spaces. No abnormal extra-axial fluid collections. No extra-axial masses though, contrast enhanced sequences would be more sensitive. VASCULAR: Normal major intracranial vascular flow voids present at skull base. SKULL AND UPPER CERVICAL SPINE: No abnormal sellar expansion. No suspicious calvarial bone marrow signal. Craniocervical junction maintained. SINUSES/ORBITS: Chronic RIGHT maxillary sinusitis. LEFT sphenoethmoidal mucosal thickening. Trace mastoid effusions. Status post RIGHT ocular lens implant. The included ocular globes and orbital contents are non-suspicious. OTHER: None. MRA HEAD FINDINGS ANTERIOR CIRCULATION: Normal flow related enhancement of the  included cervical, petrous, cavernous and supraclinoid internal carotid arteries. Patent anterior communicating artery. Normal flow related enhancement of the anterior and middle cerebral arteries, including distal segments. Supernumerary anterior cerebral artery arising from LEFT A1-2 junction. Mild luminal regularity. No large vessel occlusion, high-grade stenosis, aneurysm. POSTERIOR CIRCULATION: LEFT vertebral artery is dominant. Basilar artery is patent, with normal flow related enhancement of the main branch vessels. Normal flow related enhancement of the posterior cerebral arteries. Mild luminal regularity. No large vessel occlusion, high-grade stenosis, aneurysm. IMPRESSION: MRI HEAD: Acute sub cm RIGHT thalamic nonhemorrhagic infarct. Moderate to severe chronic small vessel ischemic disease. MRA HEAD: No emergent large vessel occlusion or high-grade stenosis. Mild intracranial atherosclerosis. Electronically Signed   By: Awilda Metroourtnay  Bloomer M.D.   On: 12/28/2015 19:01   Mr Brain Wo Contrast  Result Date: 12/28/2015 CLINICAL DATA:  LEFT-sided numbness beginning yesterday, initially attributed to medication. Assess for stroke. History of hypertension. EXAM: MRI HEAD WITHOUT CONTRAST MRA HEAD WITHOUT CONTRAST TECHNIQUE: Multiplanar, multiecho pulse sequences of the brain and surrounding structures were obtained without intravenous contrast. Angiographic images of the head were obtained using MRA technique without contrast. COMPARISON:  CT HEAD December 28, 2015 at 1028 hours FINDINGS: MRI HEAD FINDINGS BRAIN: 9 mm ovoid focus of reduced diffusion RIGHT thalamus with low ADC values. No susceptibility artifact to suggest hemorrhage. The ventricles and sulci are normal for patient's age. Patchy to confluent supratentorial pontine white matter FLAIR T2 hyperintensities. No suspicious parenchymal signal, masses or mass effect. Numerous RIGHT greater than LEFT basal ganglia perivascular spaces. No abnormal  extra-axial fluid collections. No extra-axial masses though, contrast enhanced sequences would be more sensitive. VASCULAR: Normal major intracranial vascular flow voids present at skull base. SKULL AND UPPER CERVICAL SPINE: No abnormal sellar expansion. No suspicious calvarial bone marrow signal. Craniocervical junction maintained. SINUSES/ORBITS: Chronic RIGHT maxillary sinusitis. LEFT sphenoethmoidal mucosal thickening. Trace mastoid effusions. Status post RIGHT ocular lens implant. The included ocular globes and orbital contents are non-suspicious. OTHER: None. MRA HEAD FINDINGS ANTERIOR CIRCULATION: Normal flow related enhancement of the included cervical, petrous, cavernous and supraclinoid internal carotid arteries. Patent anterior communicating artery. Normal flow related enhancement of the anterior and middle cerebral arteries, including distal segments. Supernumerary anterior cerebral artery arising from LEFT A1-2 junction. Mild luminal regularity. No large vessel occlusion, high-grade stenosis, aneurysm. POSTERIOR CIRCULATION: LEFT vertebral artery is dominant. Basilar artery is patent, with normal flow related enhancement of the main branch vessels. Normal flow related enhancement of the posterior cerebral arteries. Mild luminal regularity. No large vessel occlusion, high-grade stenosis, aneurysm. IMPRESSION: MRI HEAD: Acute sub cm RIGHT thalamic nonhemorrhagic infarct. Moderate to severe chronic small vessel ischemic disease. MRA HEAD: No emergent large vessel occlusion or high-grade stenosis. Mild intracranial atherosclerosis. Electronically Signed   By: Awilda Metroourtnay  Bloomer M.D.   On: 12/28/2015 19:01    PHYSICAL EXAM General: elderly female, NAD HEENT: /atr Lungs: ctab Heart: RRR, no  m/rg Neurological Exam : : Awake alert oriented 3.. Normal speech. Able to follow commands. Extraocular moments are full range without nystagmus. Fundi were not visualized. Vision acuity and fields are adequate.  No facial droop. Tongue is midline. Motor system exam reveals no upper or lower extremity drift. No focal weakness. Has some decreased sensation on left arm. Otherwise normal elsewhere. Normal figner to nose exam. DTR 2+ and symmetric.    ASSESSMENT/PLAN Ms. ZALIKA TIESZEN is a 82 y.o. female with history of HTN, GERD, HLD presenting with left arm numbness. She did not receive IV t-PA due to mild symptoms  Acute sub CM right thalamic nonhemorrhagic infarct secondary to small vessel disease source  Resultant  Left arm numbness  MRI  Acute sub cm RIGHT thalamic nonhemorrhagic infarct  MRA  emergent large vessel occlusion or high-grade stenosis.Mild intracranial atherosclerosis.  Carotid Doppler  1-395 bilateral ICA stenosis 2D Echo  Left ventricle: The cavity size was normal. There was mild focal   basal hypertrophy of the septum. Systolic function was normal.   The estimated ejection fraction was in the range of 60% to 65%.   Wall motion was normal; there were no regional wall motion    abnormalitiesLDL 105  HgbA1c 5.1  lovenox for VTE prophylaxis Diet - low sodium heart healthy  No antithrombotic prior to admission, now on aspirin 325 mg daily  Patient counseled to be compliant with her antithrombotic medications  Ongoing aggressive stroke risk factor management  Therapy recommendations:  CIR  Disposition:  pending  Hypertension  Stable. Hold antihypertensives.  Permissive hypertension (OK if < 220/120) but gradually normalize in 5-7 days  Long-term BP goal normotensive  Hyperlipidemia  Home meds: crestor 5 mg, resumed in hospital  LDL 105, goal < 70  Consider increasing crestor to 20mg  daily.   Other Stroke Risk Factors  Advanced age  Other Active Problems  Diarrhea  Mild hyponatremia  Hospital day # 1   I have personally examined this patient, reviewed notes, independently viewed imaging studies, participated in medical decision making and plan of  care.ROS completed by me personally and pertinent positives fully documented  I have made any additions or clarifications directly to the above note.  She presented with left sided numbness secondary to small right thalamic lacunar infarct due to small vessel disease. Continue aspirin 325 mg daily  And transfer to inpatient rehab.. Follow-up as an outpatient in stroke clinic in 6 weeks. Discussed with Dr. Pincus Badder. Greater than 50% time during this 25 minute visit was spent on counseling and coordination of care about stroke risk, prevention and treatment  Delia Heady, MD Medical Director [facility] Pager: [phone] 12/30/2015 3:32 PM

## 2015-12-30 NOTE — Progress Notes (Signed)
Report given to RN

## 2015-12-30 NOTE — H&P (Signed)
Physical Medicine and Rehabilitation Admission H&P     Chief Complaint  Patient presents with  . Numbness Left side of body, arm and leg   :  ZOX:[id number]:[name] is a 82 y.o. right handed female with history of hypertension, hyperlipidemia. Per chart review patient lives alone and was independent prior to admission. One level home with 4 steps to entry. Recent admission for hyponatremia with dehydration presumed secondary to UTI. Presented 12/28/2015 with left-sided numbness.Sodium 130, troponin negative. CT/MRI showed acute right thalamic nonhemorrhagic infarct. Moderate to severe chronic small vessel ischemic disease. MRA with no emergent large vessel occlusion or stenosis. Ultrasound the abdomen showed cholelithiasis without evidence of cholecystitis. Patient did not receive TPA. Carotid Dopplers with no ICA stenosis. Echocardiogram is pending. Aspirin for CVA prophylaxis. Subcutaneous Lovenox for DVT prophylaxis. Tolerating a regular diet.Patient with bouts of loose stool with C. difficile specimens negative. Physical and occupational therapy evaluations completed with recommendations of physical medicine rehabilitation consult. Patient was admitted for comprehensive rehabilitation program   She complains of diarrhea this morning, which subsided after Imodium. She was treated with antibiotics last week. Review of Systems  Constitutional: Negative for chills and fever.  HENT: Negative for hearing loss and tinnitus.  Eyes: Negative for blurred vision and double vision.  Respiratory: Negative for cough and shortness of breath.  Cardiovascular: Negative for chest pain and palpitations.  Gastrointestinal: Positive for diarrhea. Negative for nausea and vomiting.  GERD  Genitourinary: Negative for flank pain and hematuria.  Musculoskeletal: Positive for joint pain and myalgias.  Skin: Negative for rash.  Neurological: Positive for dizziness and weakness. Negative for seizures and loss of  consciousness.  All other systems reviewed and are negative.       Past Medical History:  Diagnosis Date  . Acute ischemic stroke (HCC) 12/28/2015   [name] 12/28/2015  . Arthritis    "hands" (12/28/2015)  . GERD (gastroesophageal reflux disease)   . High cholesterol   . Hypertension   . PONV (postoperative nausea and vomiting)    "w/1st hip replacement"        Past Surgical History:  Procedure Laterality Date  . CATARACT EXTRACTION W/ INTRAOCULAR LENS IMPLANT Right ~ 2011  . JOINT REPLACEMENT    . TOTAL HIP ARTHROPLASTY  2007-2008   left-right        Family History  Problem Relation Age of Onset  . CAD Father   . Stroke Brother   . Alcohol abuse Brother   . CAD Brother    Social History: reports that she has never smoked. She has never used smokeless tobacco. She reports that she does not drink alcohol or use drugs.  Allergies:       Allergies  Allergen Reactions  . Ativan [Lorazepam]     altered  . Phenergan [Promethazine]     "Seeing things"          Medications Prior to Admission  Medication Sig Dispense Refill  . amLODipine (NORVASC) 5 MG tablet Take 5 mg by mouth daily.    Marland Kitchen. atenolol (TENORMIN) 50 MG tablet Take 50 mg by mouth daily.    Marland Kitchen. latanoprost (XALATAN) 0.005 % ophthalmic solution Place 1 drop into both eyes at bedtime.    . ondansetron (ZOFRAN) 4 MG tablet Take 1 tablet (4 mg total) by mouth every 6 (six) hours as needed for nausea. 20 tablet 0   Home:  Home Living  Family/patient expects to be discharged to:: Inpatient rehab  Living Arrangements: Alone  Available  Help at Discharge: Family, Available PRN/intermittently  Type of Home: House  Home Access: Stairs to enter  [employer] of Steps: 4  Entrance Stairs-Rails: Can reach both  Home Layout: One level  Bathroom Shower/Tub: Medical sales representative: Standard  Bathroom Accessibility: Yes  Home Equipment: Environmental consultant - 2 wheels, Cane - single point  Functional History:   Prior Function  Level of Independence: Independent  Comments: reports she's been using RW since last hospital stay last week  Functional Status:  Mobility:  Bed Mobility  General bed mobility comments: in chair on arrival  Transfers  Overall transfer level: Needs assistance  Equipment used: 1 person hand held assist  Transfers: Sit to/from Stand  Sit to Stand: Min assist  Stand pivot transfers: Mod assist  General transfer comment: v/c's for safe hand placement  Ambulation/Gait  Ambulation/Gait assistance: Min assist, Mod assist  Ambulation Distance (Feet): 100 Feet  Assistive device: Rolling walker (2 wheeled), 1 person hand held assist  Gait Pattern/deviations: Step-through pattern, Staggering left, Staggering right, Wide base of support  General Gait Details: pt amb 60' with RW, pt min guard but noted vearing to the L. pt then attempted to amb without RW and pt very unsteady requiring modA to maintain stability. pt became to feel whoozy requring max encouragement to maintain eye opening  Gait velocity: slow  Gait velocity interpretation: Below normal speed for age/gender   ADL:  ADL  Overall ADL's : Needs assistance/impaired  Eating/Feeding: Independent  Eating/Feeding Details (indicate cue type and reason): able to open all containers and packages with SLP  Grooming: Wash/dry face, Supervision/safety, Sitting  Grooming Details (indicate cue type and reason): incr (A) required with standing. pt needs (A) for steady due to balance deficits  Lower Body Bathing: Sitting/lateral leans, Minimal assistance  Lower Body Bathing Details (indicate cue type and reason): able to adjust L sock  Toilet Transfer: Moderate assistance  Toilet Transfer Details (indicate cue type and reason): pt very petite in size so one person (A) provided. pt with L LE coordination deficits and requires (A) for steady  General ADL Comments: pt upright in chair on arrival. pt c/o visual changes and L UE  sensation changes  Cognition:  Cognition  Overall Cognitive Status: Within Functional Limits for tasks assessed  Orientation Level: Oriented X4  Cognition  Arousal/Alertness: Awake/alert  Behavior During Therapy: WFL for tasks assessed/performed  Overall Cognitive Status: Within Functional Limits for tasks assessed  Area of Impairment: Awareness  Following Commands: Follows multi-step commands with increased time  Awareness: Emergent  Problem Solving: Slow processing  General Comments: Pt with lack of awareness to deficits. pt expresses changes in vision and numbness L UE / face. Pt needs cues for L side  Physical Exam:  Blood pressure 98/76, pulse 72, temperature 98 F (36.7 C), temperature source Oral, resp. rate 16, height 5' (1.524 m), weight 48.1 kg (106 lb), SpO2 99 %.  Physical Exam  Vitals reviewed.  Constitutional: She is oriented to person, place, and time. She appears well-developed and well-nourished.  HENT:  Head: Normocephalic.  Eyes: EOM are normal.  Neck: Normal range of motion. Neck supple. No tracheal deviation present. No thyromegaly present.  Cardiovascular: Normal rate, regular rhythm and normal heart sounds.  Respiratory: Effort normal and breath sounds normal. No respiratory distress. She has no wheezes. She has no rales.  GI: Soft. Bowel sounds are normal. She exhibits no distension. There is no tenderness. There is no rebound.  Musculoskeletal: She exhibits no  edema.  Neurological: She is oriented to person, place, and time.  Follows commands  Skin: Skin is warm and dry.  Motor strength is 4/5 in the left deltoid, biceps, triceps, grip,  5/5 in the right deltoid, biceps, positive group.  Decreased sensation to light touch. Left hemifacial left upper extremity. Left lower extremity is able to distinguish which finger or toe was touched however feels different than on the right side.  Mild dysmetria, left finger-nose to finger , left heel-to-shin Lab Results  Last 48 Hours  Imaging Results (Last 48 hours)     Medical Problem List and Plan:  1. 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 DVT Prophylaxis/Anticoagulation: Subcutaneous Lovenox. Monitor platelet counts and any signs of bleeding  3. Pain Management: Tylenol as needed  4. Hypertension. No current antihypertensive medication. Patient receiving Norvasc 5 mg daily, Tenormin 50 mg daily prior to admission. Resume as needed  5. Neuropsych: This patient is capable of making decisions on her own behalf.  6. Skin/Wound Care: Routine skin checks  7. Fluids/Electrolytes/Nutrition: Routine I&O with follow-up chemistries  8. Hyponatremia. Improved 134. Follow-up chemistries  9. Loose stools. C. difficile specimen negative. No nausea or vomiting  10. Hyperlipidemia. Crestor  Post Admission Physician Evaluation:  1. Functional deficits secondary to Right thalamic infarct with left hemisensory ataxia and sensory deficits. 2. Patient is admitted to receive collaborative, interdisciplinary care between the physiatrist, rehab nursing staff, and therapy team. 3. Patient's level  of medical complexity and substantial therapy needs in context of that medical necessity cannot be provided at a lesser intensity of care such as a SNF. 4. Patient has experienced substantial functional loss from his/her baseline which was documented above under the "Functional History" and "Functional Status" headings. Judging by the patient's diagnosis, physical exam, and functional history, the patient has potential for functional progress which will result in measurable gains while on inpatient rehab. These gains will be of substantial and practical use upon discharge in facilitating mobility and self-care at the household level. 5. Physiatrist will provide 24 hour management of medical needs as well as oversight of the therapy plan/treatment and provide guidance as appropriate regarding the interaction of the two. 6. The Preadmission Screening has been reviewed and patient status is unchanged unless otherwise stated above. 7. 24 hour rehab nursing will assist with bladder management, bowel management, safety, skin/wound care, disease management, medication administration, pain management and patient education and help integrate therapy concepts, techniques,education, etc. 8. PT will assess and treat for/with: pre gait, gait training, endurance , safety, equipment, neuromuscular re education. Goals are: Mod I. 9. OT will assess and treat for/with: ADLs, Cognitive perceptual skills, Neuromuscular re education, safety, endurance, equipment. Goals are: Mod I. Therapy may proceed with showering this patient. 10. SLP will assess and treat for/with: NA Goals are: NA. 11. Case Management and Social Worker will assess and treat for psychological issues and discharge planning. 12. Team conference will be held weekly to assess progress toward goals and to determine barriers to discharge. 13. Patient will receive at least 3 hours of therapy per day at least 5 days per week. 14. ELOS: 7-9d  15. Prognosis:  excellent Erick Colace M.D. [facility] FAAPM&R (Sports Med, Neuromuscular Med) Diplomate Am Board of Electrodiagnostic Med   12/29/2015

## 2015-12-30 NOTE — Progress Notes (Signed)
[facility] Admission Coordinator Signed Physical Medicine and Rehabilitation  PMR Pre-admission Date of Service: 12/29/2015 2:46 PM  Related encounter: ED to Hosp-Admission (Discharged) from 12/28/2015 in [facility] 3M NE[facility]ICAL       [] Hide copied text PMR Admission Coordinator Pre-Admission Assessment  Patient: [name] is an 82 y.o., female MRN: [id number] DOB: 27-Feb-1933 Height: 5' (152.4 cm) Weight: 48.1 kg (106 lb)                                                                                                                                                  Insurance Information HMO:     PPO:      PCP:      IPA:      80/20:      OTHER:  PRIMARY: Medicare A & B      Policy#: [id number]      Subscriber: Self CM Name:       Phone#:      Fax#:  Pre-Cert#:       Employer:  Benefits:  Phone #:      Name:  Eff. Date: A & B 03/30/2000     Deduct: $1,316      Out of Pocket Max: None      Life Max: None CIR: 100%      SNF: 100% days 1-20; 80% days 21-100 Outpatient: PT/OT 80%    Co-Pay: 20% Home Health: PT/OT 100%    Co-Pay: $0 DME: 80%     Co-Pay: 20%  Providers: patient's choice   SECONDARY: BCBS Supplement       Policy#: [id number]      Subscriber: Self CM Name:       Phone#:      Fax#:  Pre-Cert#:       Employer: Retired Benefits:  Phone #:  [phone]    Name:  Eff. Date:      Deduct:       Out of Pocket Max:       Life Max:  CIR:       SNF:  Outpatient:      Co-Pay:  Home Health:       Co-Pay:  DME:      Co-Pay:   TERTIARY: A[employer]      Policy#: [id number]      Subscriber: Self CM Name:       Phone#:      Fax#:  Pre-Cert#:       Employer: Retired Benefits:  Phone #: [phone]     Name:  Eff. Date:      Deduct:       Out of Pocket Max:       Life Max:  CIR:       SNF:  Outpatient:      Co-Pay:  Home Health:  Co-Pay:  DME:      Co-Pay:   Medicaid Application Date:       Case Manager:  Disability Application Date:       Case  Worker:   Emergency Contact Information        Contact Information    Name Relation Home Work Mobile   Overton,Wendy Daughter [phone]       Current Medical History  Patient Admitting Diagnosis: Right thalamic infarct with left hemisensory deficits and left hemisensory ataxia  History of Present Illness: Kristen Shimmin Clarkis a 82 y.o.right handed femalewith history of hypertension, hyperlipidemia.Per chart review patient lives alone and was independent prior to admission. One level home with 4 steps to entry.Recent admission for hyponatremia with dehydration presumed secondary to UTI. Presented 11/28/2017with left-sided numbness.Sodium 130, troponin negative.CT/MRI showed acute right thalamic nonhemorrhagic infarct. Moderate to severe chronic small vessel ischemic disease. MRA with no emergent large vessel occlusion or stenosis. Ultrasound the abdomen showed cholelithiasis without evidence of cholecystitis. Patient did not receive TPA.Carotid Dopplers with no ICA stenosis.Echocardiogram is pending. Aspirin for CVA prophylaxis. Subcutaneous Lovenox for DVT prophylaxis. Tolerating a regular diet.Patient with bouts of loose stool with C. difficile specimens negative. Physical and occupational therapy evaluations completed with recommendations of physical medicine rehabilitation consult. Patient was admitted for comprehensive rehabilitation program.   NIH Total: 2    Past Medical History      Past Medical History:  Diagnosis Date  . Acute ischemic stroke (HCC) 12/28/2015   [name] 12/28/2015  . Arthritis    "hands" (12/28/2015)  . GERD (gastroesophageal reflux disease)   . High cholesterol   . Hypertension   . PONV (postoperative nausea and vomiting)    "w/1st hip replacement"    Family History  family history includes Alcohol abuse in her brother; CAD in her brother and father; Stroke in her brother.  Prior Rehab/Hospitalizations:  Has the patient had  major surgery during 100 days prior to admission? No  Current Medications   Current Facility-Administered Medications:  .   stroke: mapping our early stages of recovery book, , Does not apply, Once, Russella Dar, NP .  aspirin suppository 300 mg, 300 mg, Rectal, Daily **OR** aspirin tablet 325 mg, 325 mg, Oral, Daily, Russella Dar, NP, 325 mg at 12/30/15 0951 .  enoxaparin (LOVENOX) injection 40 mg, 40 mg, Subcutaneous, Q24H, Russella Dar, NP, 40 mg at 12/29/15 1715 .  hydrALAZINE (APRESOLINE) injection 10 mg, 10 mg, Intravenous, Q4H PRN, Russella Dar, NP .  latanoprost (XALATAN) 0.005 % ophthalmic solution 1 drop, 1 drop, Both Eyes, QHS, [name], NP, 1 drop at 12/30/15 0140 .  loperamide (IMODIUM) capsule 2 mg, 2 mg, Oral, PRN, Joseph Art, DO .  metoprolol (LOPRESSOR) injection 5 mg, 5 mg, Intravenous, Q4H PRN, Russella Dar, NP .  rosuvastatin (CRESTOR) tablet 5 mg, 5 mg, Oral, q1800, Joseph Art, DO, 5 mg at 12/29/15 1715  Patients Current Diet: Diet regular Room service appropriate? Yes; Fluid consistency: Thin Diet - low sodium heart healthy  Precautions / Restrictions Precautions Precautions: Fall Precaution Comments: L side numbness  Restrictions Weight Bearing Restrictions: No   Has the patient had 2 or more falls or a fall with injury in the past year?Yes, 2 falls per her report and last one was 2 weeks ago and she was injured (suspect cracked rib) but did not seek medical attention.    Prior Activity Level Community (5-7x/wk): Prior to admission patient was active  daily with home management, yard work, involved with church choir, and volunteered in her community.     Home Assistive Devices / Equipment Home Assistive Devices/Equipment: Eyeglasses, Blood pressure cuff, Cane (specify quad or straight), Walker (specify type) Home Equipment: Walker - 2 wheels, Cane - single point  Prior Device Use: Indicate devices/aids used by the  patient prior to current illness, exacerbation or injury? Patient reports using a walking stick when she was outside PTA.  Prior Functional Level Prior Function Level of Independence: Independent Comments: reports she's been using RW since last hospital stay last week  Self Care: Did the patient need help bathing, dressing, using the toilet or eating?  Independent  Indoor Mobility: Did the patient need assistance with walking from room to room (with or without device)? Independent  Stairs: Did the patient need assistance with internal or external stairs (with or without device)? Independent  Functional Cognition: Did the patient need help planning regular tasks such as shopping or remembering to take medications? Independent  Current Functional Level Cognition Overall Cognitive Status: Within Functional Limits for tasks assessed Orientation Level: Oriented X4 Following Commands: Follows multi-step commands with increased time General Comments: Pt with lack of awareness to deficits. pt expresses changes in vision and numbness L UE / face. Pt needs cues for L side    Extremity Assessment (includes Sensation/Coordination) Upper Extremity Assessment: LUE deficits/detail LUE Deficits / Details: numbness, 4 out 5 grossly. pt appears to have good motor input with sensory loss. Pt have WFL shoulder elbow wrist and digit ROM LUE Sensation: decreased light touch LUE Coordination: decreased fine motor, decreased gross motor  Lower Extremity Assessment: Defer to PT evaluation LLE Deficits / Details: reports sensation is deficit than R but it is not numb LLE Sensation: decreased light touch (50% deficit)   ADLs Overall ADL's : Needs assistance/impaired Eating/Feeding: Independent Eating/Feeding Details (indicate cue type and reason): able to open all containers and packages with SLP  Grooming: Wash/dry face, Supervision/safety, Sitting Grooming Details (indicate cue type and reason): incr  (A) required with standing. pt needs (A) for steady due to balance deficits Lower Body Bathing: Sitting/lateral leans, Minimal assistance Lower Body Bathing Details (indicate cue type and reason): able to adjust L sock Toilet Transfer: Moderate assistance Toilet Transfer Details (indicate cue type and reason): pt very petite in size so one person (A) provided. pt with L LE coordination deficits and requires (A) for steady General ADL Comments: pt upright in chair on arrival. pt c/o visual changes and L UE sensation changes   Mobility General bed mobility comments: in chair on arrival   Transfers Overall transfer level: Needs assistance Equipment used: 1 person hand held assist Transfers: Sit to/from Stand Sit to Stand: Min assist Stand pivot transfers: Mod assist General transfer comment: v/c's for safe hand placement   Ambulation / Gait / Stairs / Wheelchair Mobility Ambulation/Gait Ambulation/Gait assistance: Min assist, Mod assist Ambulation Distance (Feet): 100 Feet Assistive device: Rolling walker (2 wheeled), 1 person hand held assist Gait Pattern/deviations: Step-through pattern, Staggering left, Staggering right, Wide base of support General Gait Details: pt amb 2350' with RW, pt min guard but noted vearing to the L. pt then attempted to amb without RW and pt very unsteady requiring modA to maintain stability. pt became to feel whoozy requring max encouragement to maintain eye opening Gait velocity: slow Gait velocity interpretation: Below normal speed for age/gender   Posture / Balance Balance Overall balance assessment: Needs assistance Sitting-balance support: Feet supported Sitting balance-Leahy  Scale: Fair Standing balance support: During functional activity Standing balance-Leahy Scale: Poor Standing balance comment: needs external support   Special needs/care consideration BiPAP/CPAP: No CPM: No Continuous Drip IV: No Dialysis: No         Life Vest: No Oxygen:  No Special Bed: No Trach Size: No Wound Vac (area): No       Skin: WDL                               Bowel mgmt: 12/28/15 patient reports stools are more formed  Bladder mgmt: Continent  Diabetic mgmt: No    Previous Home Environment Living Arrangements: Alone Available Help at Discharge: Family, Available PRN/intermittently Type of Home: House Home Layout: One level Home Access: Stairs to enter Entrance Stairs-Rails: Can reach both Entrance Stairs-Number of Steps: 4 Bathroom Shower/Tub: Engineer, manufacturing systemsTub/shower unit Bathroom Toilet: Standard Bathroom Accessibility: Yes Home Care Services: No  Discharge Living Setting Plans for Discharge Living Setting: Patient's home (rental ) Type of Home at Discharge: House Discharge Home Layout: One level Discharge Home Access: Stairs to enter Entrance Stairs-Rails: Right, Left (cannot reach both ) Entrance Stairs-Number of Steps: 3-4 Discharge Bathroom Shower/Tub: Other (comment) (garden tub; "bird bath" PTA due to being scared of tub ) Discharge Bathroom Toilet: Standard Discharge Bathroom Accessibility: Yes How Accessible: Accessible via walker Does the patient have any problems obtaining your medications?: No  Social/Family/Support Systems Patient Roles: Parent Contact Information: Daughter: Ignacia MarvelWendy Overton [phone] Anticipated Caregiver: Daughter  Anticipated Caregiver's Contact Information: [phone] Ability/Limitations of Caregiver: None she is retired but lives 20 minutes away from mother  Caregiver Availability: Intermittent Discharge Plan Discussed with Primary Caregiver: Yes Is Caregiver In Agreement with Plan?: Yes Does Caregiver/Family have Issues with Lodging/Transportation while Pt is in Rehab?: No  Goals/Additional Needs Patient/Family Goal for Rehab: PT/OT Mod I-Supervision  Expected length of stay: 7-10 days  Cultural Considerations: [employer]  Dietary Needs: Regular and thin  Equipment Needs: TBD Special Service  Needs: None Additional Information: None Pt/Family Agrees to Admission and willing to participate: Yes Program Orientation Provided & Reviewed with Pt/Caregiver Including Roles  & Responsibilities: Yes Additional Information Needs: None Information Needs to be Provided By: N/A  Decrease burden of Care through IP rehab admission: No   Possible need for SNF placement upon discharge: No   Patient Condition: This patient's condition remains as documented in the consult dated 12/29/15, in which the Rehabilitation Physician determined and documented that the patient's condition is appropriate for intensive rehabilitative care in an inpatient rehabilitation facility. Will admit to inpatient rehab today.   Preadmission Screen Completed By:  Fae PippinMelissa Serenitee Fuertes, 12/30/2015 10:12 AM ______________________________________________________________________   Discussed status with Dr. Wynn BankerKirsteins on 12/30/15 at 1012 and received telephone approval for admission today.  Admission Coordinator:  Fae PippinMelissa Kaelob Persky, time 1012/Date 12/30/15       Cosigned by: Erick ColaceAndrew E Kirsteins, MD at 12/30/2015 1:21 PM  Revision History

## 2015-12-30 NOTE — Progress Notes (Signed)
Erick Colace, MD Physician Signed Physical Medicine and Rehabilitation  Consult Note Date of Service: 12/29/2015 10:25 AM  Related encounter: ED to Hosp-Admission (Discharged) from 12/28/2015 in [facility]ICAL     Expand All Collapse All   [] Hide copied text [] Hover for attribution information      Physical Medicine and Rehabilitation Consult Reason for Consult: Right subacute nonhemorrhagic thalamic infarct Referring Physician: Triad   HPI: [name] is a 82 y.o. right handed female with history of hypertension, hyperlipidemia. Per chart review patient lives alone and was independent  prior to admission. One level home with 4 steps to entry. Recent admission for hyponatremia with dehydration presumed secondary to UTI. Presented 12/28/2015 with left-sided numbness. CT/MRI showed acute right thalamic nonhemorrhagic infarct. Moderate to severe chronic small vessel ischemic disease. MRA with no emergent large vessel occlusion or stenosis. Ultrasound the abdomen showed cholelithiasis without evidence of cholecystitis. Patient did not receive TPA. Echocardiogram is pending. Aspirin for CVA prophylaxis. Subcutaneous Lovenox for DVT prophylaxis. Tolerating a regular diet.   Review of Systems  Constitutional: Negative for chills and fever.  HENT: Negative for hearing loss and tinnitus.   Eyes: Negative for blurred vision and double vision.  Respiratory: Negative for cough and shortness of breath.   Cardiovascular: Negative for chest pain, palpitations and leg swelling.  Gastrointestinal: Positive for diarrhea. Negative for nausea and vomiting.       GERD  Genitourinary: Negative for dysuria and hematuria.  Musculoskeletal: Positive for myalgias.  Skin: Negative for rash.  Neurological: Positive for dizziness and weakness. Negative for seizures and loss of consciousness.  All other systems reviewed and are negative.      Past Medical History:   Diagnosis Date  . Acute ischemic stroke (HCC) 12/28/2015   [name] 12/28/2015  . Arthritis    "hands" (12/28/2015)  . GERD (gastroesophageal reflux disease)   . High cholesterol   . Hypertension   . PONV (postoperative nausea and vomiting)    "w/1st hip replacement"        Past Surgical History:  Procedure Laterality Date  . CATARACT EXTRACTION W/ INTRAOCULAR LENS IMPLANT Right ~ 2011  . JOINT REPLACEMENT    . TOTAL HIP ARTHROPLASTY  2007-2008   left-right        Family History  Problem Relation Age of Onset  . CAD Father   . Stroke Brother   . Alcohol abuse Brother   . CAD Brother    Social History:  reports that she has never smoked. She has never used smokeless tobacco. She reports that she does not drink alcohol or use drugs. Allergies:       Allergies  Allergen Reactions  . Ativan [Lorazepam]     altered  . Phenergan [Promethazine]     "Seeing things"           Medications Prior to Admission  Medication Sig Dispense Refill  . amLODipine (NORVASC) 5 MG tablet Take 5 mg by mouth daily.    Marland Kitchen atenolol (TENORMIN) 50 MG tablet Take 50 mg by mouth daily.    Marland Kitchen latanoprost (XALATAN) 0.005 % ophthalmic solution Place 1 drop into both eyes at bedtime.    . ondansetron (ZOFRAN) 4 MG tablet Take 1 tablet (4 mg total) by mouth every 6 (six) hours as needed for nausea. 20 tablet 0    Home: Home Living Family/patient expects to be discharged to:: Inpatient rehab Living Arrangements: Alone Available Help at Discharge: Family, Available PRN/intermittently  Type of Home: House Home Access: Stairs to enter Entergy CorporationEntrance Stairs-Number of Steps: 4 Entrance Stairs-Rails: Can reach both Home Layout: One level Bathroom Shower/Tub: Engineer, manufacturing systemsTub/shower unit Bathroom Toilet: Standard Bathroom Accessibility: Yes Home Equipment: Environmental consultantWalker - 2 wheels, Cane - single point  Functional History: Prior Function Level of Independence: Independent Comments: reports  she's been using RW since last hospital stay last week Functional Status:  Mobility: Bed Mobility General bed mobility comments: in chair on arrival Transfers Overall transfer level: Needs assistance Equipment used: 1 person hand held assist Transfers: Sit to/from Stand Sit to Stand: Min assist Stand pivot transfers: Mod assist General transfer comment: v/c's for safe hand placement Ambulation/Gait Ambulation/Gait assistance: Min assist, Mod assist Ambulation Distance (Feet): 100 Feet Assistive device: Rolling walker (2 wheeled), 1 person hand held assist Gait Pattern/deviations: Step-through pattern, Staggering left, Staggering right, Wide base of support General Gait Details: pt amb 6350' with RW, pt min guard but noted vearing to the L. pt then attempted to amb without RW and pt very unsteady requiring modA to maintain stability. pt became to feel whoozy requring max encouragement to maintain eye opening Gait velocity: slow Gait velocity interpretation: Below normal speed for age/gender    ADL: ADL Overall ADL's : Needs assistance/impaired Eating/Feeding: Independent Eating/Feeding Details (indicate cue type and reason): able to open all containers and packages with SLP  Grooming: Wash/dry face, Supervision/safety, Sitting Grooming Details (indicate cue type and reason): incr (A) required with standing. pt needs (A) for steady due to balance deficits Lower Body Bathing: Sitting/lateral leans, Minimal assistance Lower Body Bathing Details (indicate cue type and reason): able to adjust L sock Toilet Transfer: Moderate assistance Toilet Transfer Details (indicate cue type and reason): pt very petite in size so one person (A) provided. pt with L LE coordination deficits and requires (A) for steady General ADL Comments: pt upright in chair on arrival. pt c/o visual changes and L UE sensation changes  Cognition: Cognition Overall Cognitive Status: Within Functional Limits for tasks  assessed Orientation Level: Oriented X4 Cognition Arousal/Alertness: Awake/alert Behavior During Therapy: WFL for tasks assessed/performed Overall Cognitive Status: Within Functional Limits for tasks assessed Area of Impairment: Awareness Following Commands: Follows multi-step commands with increased time Awareness: Emergent Problem Solving: Slow processing General Comments: Pt with lack of awareness to deficits. pt expresses changes in vision and numbness L UE / face. Pt needs cues for L side  Blood pressure 98/76, pulse 72, temperature 98 F (36.7 C), temperature source Oral, resp. rate 16, height 5' (1.524 m), weight 48.1 kg (106 lb), SpO2 99 %. Physical Exam  Constitutional: She is oriented to person, place, and time.  HENT:  Head: Normocephalic.  Eyes: EOM are normal.  Neck: Normal range of motion. Neck supple. No thyromegaly present.  Cardiovascular: Normal rate and regular rhythm.   Respiratory: Effort normal and breath sounds normal. No respiratory distress.  GI: Soft. Bowel sounds are normal. She exhibits no distension.  Neurological: She is alert and oriented to person, place, and time.  Follows commands  Skin: Skin is warm and dry.  Motor strength is 4/5 in the left deltoid, biceps, triceps, grip, 5/5 in the right deltoid, biceps, positive group. Decreased sensation to light touch. Left hemifacial left upper extremity. Left lower extremity is able to distinguish which finger or toe was touched however feels different than on the right side. Mild dysmetria, left finger-nose to finger   Lab Results Last 24 Hours       Results for orders placed  or performed during the hospital encounter of 12/28/15 (from the past 24 hour(s))  Lipid panel     Status: Abnormal   Collection Time: 12/29/15  4:59 AM  Result Value Ref Range   Cholesterol 175 0 - 200 mg/dL   Triglycerides 409 (H) <150 mg/dL   HDL 36 (L) >81 mg/dL   Total CHOL/HDL Ratio 4.9 RATIO   VLDL 34 0 - 40  mg/dL   LDL Cholesterol 191 (H) 0 - 99 mg/dL  CBC     Status: Abnormal   Collection Time: 12/29/15  4:59 AM  Result Value Ref Range   WBC 4.9 4.0 - 10.5 K/uL   RBC 4.14 3.87 - 5.11 MIL/uL   Hemoglobin 12.8 12.0 - 15.0 g/dL   HCT 47.8 (L) 29.5 - 62.1 %   MCV 86.0 78.0 - 100.0 fL   MCH 30.9 26.0 - 34.0 pg   MCHC 36.0 30.0 - 36.0 g/dL   RDW 30.8 65.7 - 84.6 %   Platelets 251 150 - 400 K/uL  Comprehensive metabolic panel     Status: Abnormal   Collection Time: 12/29/15  4:59 AM  Result Value Ref Range   Sodium 134 (L) 135 - 145 mmol/L   Potassium 3.6 3.5 - 5.1 mmol/L   Chloride 101 101 - 111 mmol/L   CO2 22 22 - 32 mmol/L   Glucose, Bld 94 65 - 99 mg/dL   BUN 10 6 - 20 mg/dL   Creatinine, Ser 9.62 0.44 - 1.00 mg/dL   Calcium 8.6 (L) 8.9 - 10.3 mg/dL   Total Protein 5.6 (L) 6.5 - 8.1 g/dL   Albumin 3.3 (L) 3.5 - 5.0 g/dL   AST 21 15 - 41 U/L   ALT 14 14 - 54 U/L   Alkaline Phosphatase 48 38 - 126 U/L   Total Bilirubin 1.3 (H) 0.3 - 1.2 mg/dL   GFR calc non Af Amer >60 >60 mL/min   GFR calc Af Amer >60 >60 mL/min   Anion gap 11 5 - 15      Imaging Results (Last 48 hours)  Ct Head Wo Contrast  Result Date: 12/28/2015 CLINICAL DATA:  Left facial numbness, left arm numbness EXAM: CT HEAD WITHOUT CONTRAST TECHNIQUE: Contiguous axial images were obtained from the base of the skull through the vertex without intravenous contrast. COMPARISON:  None. FINDINGS: Brain: The ventricular system is prominent as are the cortical sulci and frontal CSF spaces consistent with diffuse atrophy. The septum is midline in position. The fourth ventricle and basilar cisterns are unremarkable. There is moderately severe small vessel ischemic change throughout the periventricular white matter. A left subcortical infarct cannot be excluded in the parietal region. MRI would be helpful if more sensitive assessment is warranted. No hemorrhage, or mass lesion is seen. Vascular: No  vascular abnormality is seen on this unenhanced study. Skull: No calvarial lesion is noted. Sinuses/Orbits: However, there is complete opacification of the right maxillary sinus with partial opacification of ethmoid air cells and the left sphenoid sinus consistent with diffuse sinus disease. The left sphenoid sinus may represent chronic sinusitis with somewhat thickened walls. Other: None IMPRESSION: 1. Moderate atrophy and small vessel ischemic change. Cannot exclude acute or subacute subcortical infarct in the left parietal region. Consider MRI of the brain. 2. Sinusitis involving the right maxillary sinus , ethmoid air cells, and the left sphenoid sinus some of which may be chronic. Electronically Signed   By: Dwyane Dee M.D.   On: 12/28/2015 10:42  Mr Shirlee LatchMra Head JXWo Contrast  Result Date: 12/28/2015 CLINICAL DATA:  LEFT-sided numbness beginning yesterday, initially attributed to medication. Assess for stroke. History of hypertension. EXAM: MRI HEAD WITHOUT CONTRAST MRA HEAD WITHOUT CONTRAST TECHNIQUE: Multiplanar, multiecho pulse sequences of the brain and surrounding structures were obtained without intravenous contrast. Angiographic images of the head were obtained using MRA technique without contrast. COMPARISON:  CT HEAD December 28, 2015 at 1028 hours FINDINGS: MRI HEAD FINDINGS BRAIN: 9 mm ovoid focus of reduced diffusion RIGHT thalamus with low ADC values. No susceptibility artifact to suggest hemorrhage. The ventricles and sulci are normal for patient's age. Patchy to confluent supratentorial pontine white matter FLAIR T2 hyperintensities. No suspicious parenchymal signal, masses or mass effect. Numerous RIGHT greater than LEFT basal ganglia perivascular spaces. No abnormal extra-axial fluid collections. No extra-axial masses though, contrast enhanced sequences would be more sensitive. VASCULAR: Normal major intracranial vascular flow voids present at skull base. SKULL AND UPPER CERVICAL SPINE: No  abnormal sellar expansion. No suspicious calvarial bone marrow signal. Craniocervical junction maintained. SINUSES/ORBITS: Chronic RIGHT maxillary sinusitis. LEFT sphenoethmoidal mucosal thickening. Trace mastoid effusions. Status post RIGHT ocular lens implant. The included ocular globes and orbital contents are non-suspicious. OTHER: None. MRA HEAD FINDINGS ANTERIOR CIRCULATION: Normal flow related enhancement of the included cervical, petrous, cavernous and supraclinoid internal carotid arteries. Patent anterior communicating artery. Normal flow related enhancement of the anterior and middle cerebral arteries, including distal segments. Supernumerary anterior cerebral artery arising from LEFT A1-2 junction. Mild luminal regularity. No large vessel occlusion, high-grade stenosis, aneurysm. POSTERIOR CIRCULATION: LEFT vertebral artery is dominant. Basilar artery is patent, with normal flow related enhancement of the main branch vessels. Normal flow related enhancement of the posterior cerebral arteries. Mild luminal regularity. No large vessel occlusion, high-grade stenosis, aneurysm. IMPRESSION: MRI HEAD: Acute sub cm RIGHT thalamic nonhemorrhagic infarct. Moderate to severe chronic small vessel ischemic disease. MRA HEAD: No emergent large vessel occlusion or high-grade stenosis. Mild intracranial atherosclerosis. Electronically Signed   By: Awilda Metroourtnay  Bloomer M.D.   On: 12/28/2015 19:01   Mr Brain Wo Contrast  Result Date: 12/28/2015 CLINICAL DATA:  LEFT-sided numbness beginning yesterday, initially attributed to medication. Assess for stroke. History of hypertension. EXAM: MRI HEAD WITHOUT CONTRAST MRA HEAD WITHOUT CONTRAST TECHNIQUE: Multiplanar, multiecho pulse sequences of the brain and surrounding structures were obtained without intravenous contrast. Angiographic images of the head were obtained using MRA technique without contrast. COMPARISON:  CT HEAD December 28, 2015 at 1028 hours FINDINGS: MRI  HEAD FINDINGS BRAIN: 9 mm ovoid focus of reduced diffusion RIGHT thalamus with low ADC values. No susceptibility artifact to suggest hemorrhage. The ventricles and sulci are normal for patient's age. Patchy to confluent supratentorial pontine white matter FLAIR T2 hyperintensities. No suspicious parenchymal signal, masses or mass effect. Numerous RIGHT greater than LEFT basal ganglia perivascular spaces. No abnormal extra-axial fluid collections. No extra-axial masses though, contrast enhanced sequences would be more sensitive. VASCULAR: Normal major intracranial vascular flow voids present at skull base. SKULL AND UPPER CERVICAL SPINE: No abnormal sellar expansion. No suspicious calvarial bone marrow signal. Craniocervical junction maintained. SINUSES/ORBITS: Chronic RIGHT maxillary sinusitis. LEFT sphenoethmoidal mucosal thickening. Trace mastoid effusions. Status post RIGHT ocular lens implant. The included ocular globes and orbital contents are non-suspicious. OTHER: None. MRA HEAD FINDINGS ANTERIOR CIRCULATION: Normal flow related enhancement of the included cervical, petrous, cavernous and supraclinoid internal carotid arteries. Patent anterior communicating artery. Normal flow related enhancement of the anterior and middle cerebral arteries, including distal segments. Supernumerary anterior cerebral  artery arising from LEFT A1-2 junction. Mild luminal regularity. No large vessel occlusion, high-grade stenosis, aneurysm. POSTERIOR CIRCULATION: LEFT vertebral artery is dominant. Basilar artery is patent, with normal flow related enhancement of the main branch vessels. Normal flow related enhancement of the posterior cerebral arteries. Mild luminal regularity. No large vessel occlusion, high-grade stenosis, aneurysm. IMPRESSION: MRI HEAD: Acute sub cm RIGHT thalamic nonhemorrhagic infarct. Moderate to severe chronic small vessel ischemic disease. MRA HEAD: No emergent large vessel occlusion or high-grade  stenosis. Mild intracranial atherosclerosis. Electronically Signed   By: Awilda Metro M.D.   On: 12/28/2015 19:01   US Abdomen Limited Ruq  Result Date: 12/28/2015 CLINICAL DATA:  Nausea vomiting and diarrhea. EXAM: US ABDOMEN LIMITED - RIGHT UPPER QUADRANT COMPARISON:  Ultrasound abdomen and CT abdomen 12/23/2015 FINDINGS: Gallbladder: Multiple gallstones measuring up to 15 mm. Gallbladder sludge. Negative sonographic Murphy sign. Gallbladder wall not thickened 1.2 mm Common bile duct: Diameter: Common bile duct 7.5 mm which has enlarged since the prior study. Normal LFT today. Liver: Calcified granuloma left lobe of the liver. No liver mass. No ascites IMPRESSION: Cholelithiasis without evidence of cholecystitis Mild biliary ductal dilatation. Common bile duct 7.5 mm compared with 5 mm on the prior ultrasound. Normal LFT today. Electronically Signed   By: Marlan Palau M.D.   On: 12/28/2015 15:34     Assessment/Plan: Diagnosis: Right thalamic infarct with left hemisensory deficits and left hemisensory ataxia 1. Does the need for close, 24 hr/day medical supervision in concert with the patient's rehab needs make it unreasonable for this patient to be served in a less intensive setting? Yes 2. Co-Morbidities requiring supervision/potential complications: Hypertension 3. Due to bladder management, bowel management, safety, skin/wound care, disease management, medication administration, pain management and patient education, does the patient require 24 hr/day rehab nursing? Yes 4. Does the patient require coordinated care of a physician, rehab nurse, PT (1-2 hrs/day, 5 days/week) and OT (1-2 hrs/day, 5 days/week) to address physical and functional deficits in the context of the above medical diagnosis(es)? Yes Addressing deficits in the following areas: balance, endurance, locomotion, strength, transferring, bowel/bladder control, bathing, dressing, feeding, grooming, toileting, cognition and  psychosocial support 5. Can the patient actively participate in an intensive therapy program of at least 3 hrs of therapy per day at least 5 days per week? Yes 6. The potential for patient to make measurable gains while on inpatient rehab is excellent 7. Anticipated functional outcomes upon discharge from inpatient rehab are modified independent and supervision  with PT, modified independent and supervision with OT, n/a with SLP. 8. Estimated rehab length of stay to reach the above functional goals is: 7-10 days 9. Does the patient have adequate social supports and living environment to accommodate these discharge functional goals? Yes 10. Anticipated D/C setting: Home 11. Anticipated post D/C treatments: HH therapy 12. Overall Rehab/Functional Prognosis: excellent  RECOMMENDATIONS: This patient's condition is appropriate for continued rehabilitative care in the following setting: CIR Patient has agreed to participate in recommended program. Yes Note that insurance prior authorization may be required for reimbursement for recommended care.  Comment: These to complete inpatient workup, needs OT eval    12/29/2015    Revision History                        Routing History

## 2015-12-30 NOTE — Progress Notes (Signed)
Inpatient Rehabilitation  Note completion of stroke workup will plan to admit patient to IP Rehab today.  Please call with questions.    Pernella Ackerley, M.A., CCC/SLP AdCharlane Ferrettimission Coordinator  [facility]  Cell [phone]

## 2015-12-30 NOTE — IPOC Note (Signed)
Overall Plan of Care [facility](IPOC) Patient Details Name: Ardith DarkJenie S Single MRN: [id number] DOB: 06/04/1933  Admitting Diagnosis: stroke  Hospital Problems: Active Problems:   Thalamic infarct, acute (HCC)   Ataxia, late effect of cerebrovascular disease   Alteration of sensation as late effect of stroke   Sleep disturbance   Hemiparesis of right nondominant side due to cerebral infarction (HCC)   Benign essential HTN     Functional Problem List: Nursing Behavior, Bladder, Bowel, Edema, Endurance, Medication Management, Motor, Nutrition, Pain, Safety, Sensory, Skin Integrity, Perception  PT Balance, Motor, Nutrition, Pain, Perception, Safety, Sensory  OT Balance, Endurance, Motor, Perception, Sensory, Safety  SLP    TR         Basic ADL's: OT Grooming, Bathing, Dressing, Toileting     Advanced  ADL's: OT Simple Meal Preparation, Light Housekeeping     Transfers: PT Bed Mobility, Car, Bed to Chair, State Street CorporationFurniture, Civil Service fast streamerloor  OT Toilet, Research scientist (life sciences)Tub/Shower     Locomotion: PT Ambulation, Psychologist, prison and probation servicesWheelchair Mobility, Stairs     Additional Impairments: OT Fuctional Use of Upper Extremity  SLP        TR      Anticipated Outcomes Item Anticipated Outcome  Self Feeding    Swallowing      Basic self-care  Mod I  Toileting  Mod I   Bathroom Transfers Mod I  Bowel/Bladder  min assist  Transfers  mod I  Locomotion  mod I household, supervision community   Communication     Cognition     Pain  less<3  Safety/Judgment  min assist.   Therapy Plan: PT Intensity: Minimum of 1-2 x/day ,45 to 90 minutes PT Frequency: 5 out of 7 days PT Duration Estimated Length of Stay: 7-9 days OT Intensity: Minimum of 1-2 x/day, 45 to 90 minutes OT Frequency: 5 out of 7 days OT Duration/Estimated Length of Stay: 7-9 days         Team Interventions: Nursing Interventions Patient/Family Education, Bladder Management, Bowel Management, Disease Management/Prevention, Pain Management, Medication Management, Skin  Care/Wound Management, Cognitive Remediation/Compensation, Discharge Planning, Psychosocial Support  PT interventions Ambulation/gait training, Warden/rangerBalance/vestibular training, Community reintegration, Discharge planning, Disease management/prevention, DME/adaptive equipment instruction, Functional mobility training, Neuromuscular re-education, Pain management, Patient/family education, Psychosocial support, Stair training, Therapeutic Activities, Therapeutic Exercise, UE/LE Strength taining/ROM, UE/LE Coordination activities, Wheelchair propulsion/positioning  OT Interventions Warden/rangerBalance/vestibular training, FirefighterCommunity reintegration, Discharge planning, Fish farm managerDME/adaptive equipment instruction, Functional mobility training, Neuromuscular re-education, Patient/family education, Self Care/advanced ADL retraining, Therapeutic Activities, Therapeutic Exercise, UE/LE Strength taining/ROM, UE/LE Coordination activities  SLP Interventions    TR Interventions    SW/CM Interventions Discharge Planning, Psychosocial Support, Patient/Family Education    Team Discharge Planning: Destination: PT-Home ,OT- Home , SLP-  Projected Follow-up: PT-Home health PT, Outpatient PT (HH vs OP), OT-  Home health OT, SLP-  Projected Equipment Needs: PT-To be determined, OT- To be determined, SLP-  Equipment Details: PT-pt owns RW and SPC, OT-  Patient/family involved in discharge planning: PT- Patient,  OT-Patient, SLP-   MD ELOS: 7-10 days. Medical Rehab Prognosis:  Good Assessment: 82 y.o. right handed female with history of hypertension, hyperlipidemia. Per chart review patient lives alone and was independent prior to admission. One level home with 4 steps to entry. Recent admission for hyponatremia with dehydration presumed secondary to UTI. Presented 12/28/2015 with left-sided numbness.Sodium 130, troponin negative. CT/MRI showed acute right thalamic nonhemorrhagic infarct. Moderate to severe chronic small vessel ischemic disease.  MRA with no emergent large vessel occlusion or stenosis. Ultrasound the abdomen showed cholelithiasis  without evidence of cholecystitis. Patient did not receive TPA. Carotid Dopplers with no ICA stenosis. Aspirin for CVA prophylaxis. Tolerating a regular diet. Patient with bouts of loose stool with C. difficile specimens negative. Pt with resulting functional deficits with weakness, endurance, gait abnormality.  Will set goals for Mod I for most tasks, Min A for bowel/bladder and safety with therapies.   See Team Conference Notes for weekly updates to the plan of care

## 2015-12-30 NOTE — Care Management Note (Signed)
Case Management Note  Patient Details  Name: [name] MRN: [id number] Date of Birth: 12/18/1933  Subjective/Objective:                    Action/Plan: Pt discharging to CIR. No further needs per CM.   Expected Discharge Date:                  Expected Discharge Plan:  IP Rehab Facility  In-House Referral:     Discharge planning Services     Post Acute Care Choice:    Choice offered to:     DME Arranged:    DME Agency:     HH Arranged:    HH Agency:     Status of Service:  Completed, signed off  If discussed at MicrosoftLong Length of Stay Meetings, dates discussed:    Additional Comments:  [name], RN 12/30/2015, 11:23 AM

## 2015-12-31 ENCOUNTER — Inpatient Hospital Stay (HOSPITAL_COMMUNITY): Payer: Medicare Other | Admitting: Occupational Therapy

## 2015-12-31 ENCOUNTER — Inpatient Hospital Stay (HOSPITAL_COMMUNITY): Payer: Medicare Other | Admitting: Physical Therapy

## 2015-12-31 ENCOUNTER — Encounter (HOSPITAL_COMMUNITY): Payer: Self-pay

## 2015-12-31 DIAGNOSIS — I10 Essential (primary) hypertension: Secondary | ICD-10-CM

## 2015-12-31 DIAGNOSIS — IMO0002 Reserved for concepts with insufficient information to code with codable children: Secondary | ICD-10-CM

## 2015-12-31 DIAGNOSIS — G8193 Hemiplegia, unspecified affecting right nondominant side: Secondary | ICD-10-CM

## 2015-12-31 DIAGNOSIS — G479 Sleep disorder, unspecified: Secondary | ICD-10-CM

## 2015-12-31 DIAGNOSIS — E871 Hypo-osmolality and hyponatremia: Secondary | ICD-10-CM

## 2015-12-31 LAB — CBC WITH DIFFERENTIAL/PLATELET
BASOS ABS: 0 10*3/uL (ref 0.0–0.1)
Basophils Relative: 0 %
EOS ABS: 0 10*3/uL (ref 0.0–0.7)
EOS PCT: 1 %
HCT: 35.2 % — ABNORMAL LOW (ref 36.0–46.0)
Hemoglobin: 12.6 g/dL (ref 12.0–15.0)
Lymphocytes Relative: 21 %
Lymphs Abs: 0.9 10*3/uL (ref 0.7–4.0)
MCH: 31 pg (ref 26.0–34.0)
MCHC: 35.8 g/dL (ref 30.0–36.0)
MCV: 86.5 fL (ref 78.0–100.0)
MONO ABS: 0.8 10*3/uL (ref 0.1–1.0)
Monocytes Relative: 17 %
Neutro Abs: 2.7 10*3/uL (ref 1.7–7.7)
Neutrophils Relative %: 61 %
PLATELETS: 229 10*3/uL (ref 150–400)
RBC: 4.07 MIL/uL (ref 3.87–5.11)
RDW: 12.4 % (ref 11.5–15.5)
WBC: 4.5 10*3/uL (ref 4.0–10.5)

## 2015-12-31 LAB — COMPREHENSIVE METABOLIC PANEL
ALT: 14 U/L (ref 14–54)
AST: 22 U/L (ref 15–41)
Albumin: 3.3 g/dL — ABNORMAL LOW (ref 3.5–5.0)
Alkaline Phosphatase: 50 U/L (ref 38–126)
Anion gap: 9 (ref 5–15)
BUN: 6 mg/dL (ref 6–20)
CHLORIDE: 101 mmol/L (ref 101–111)
CO2: 26 mmol/L (ref 22–32)
CREATININE: 0.57 mg/dL (ref 0.44–1.00)
Calcium: 8.8 mg/dL — ABNORMAL LOW (ref 8.9–10.3)
Glucose, Bld: 108 mg/dL — ABNORMAL HIGH (ref 65–99)
POTASSIUM: 3.5 mmol/L (ref 3.5–5.1)
SODIUM: 136 mmol/L (ref 135–145)
Total Bilirubin: 0.7 mg/dL (ref 0.3–1.2)
Total Protein: 5.4 g/dL — ABNORMAL LOW (ref 6.5–8.1)

## 2015-12-31 MED ORDER — ATENOLOL 50 MG PO TABS
50.0000 mg | ORAL_TABLET | Freq: Every day | ORAL | Status: DC
  Administered 2015-12-31 – 2016-01-06 (×7): 50 mg via ORAL
  Filled 2015-12-31 (×7): qty 1

## 2015-12-31 MED ORDER — TRAZODONE HCL 50 MG PO TABS
50.0000 mg | ORAL_TABLET | Freq: Every evening | ORAL | Status: DC | PRN
  Administered 2015-12-31 – 2016-01-06 (×6): 50 mg via ORAL
  Filled 2015-12-31 (×6): qty 1

## 2015-12-31 MED ORDER — PANTOPRAZOLE SODIUM 40 MG PO TBEC
40.0000 mg | DELAYED_RELEASE_TABLET | Freq: Every day | ORAL | Status: DC
  Administered 2015-12-31 – 2016-01-06 (×7): 40 mg via ORAL
  Filled 2015-12-31 (×7): qty 1

## 2015-12-31 MED ORDER — AMLODIPINE BESYLATE 5 MG PO TABS
5.0000 mg | ORAL_TABLET | Freq: Every day | ORAL | Status: DC
  Administered 2015-12-31 – 2016-01-04 (×5): 5 mg via ORAL
  Filled 2015-12-31 (×5): qty 1

## 2015-12-31 NOTE — Progress Notes (Signed)
Social Work Assessment and Plan Social Work Assessment and Plan  Patient Details  Name: [name] MRN: [id number] Date of Birth: 04/02/1933  Today's Date: 12/31/2015  Problem List:  Patient Active Problem List   Diagnosis Date Noted  . Sleep disturbance   . 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 Benign essential HTN   . 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 Ataxia, late effect of cerebrovascular disease   . Alteration of sensation as late effect of stroke   . CVA (cerebral vascular accident) (HCC) 12/29/2015  . Left sided numbness 12/28/2015  . Nausea & vomiting 12/28/2015  . Diarrhea 12/28/2015  . HTN (hypertension) 12/28/2015  . Acute encephalopathy 12/23/2015  . UTI (urinary tract infection) 12/23/2015  . Hyponatremia 12/23/2015  . Dehydration with hyponatremia 12/23/2015   Past Medical History:  Past Medical History:  Diagnosis Date  . Acute ischemic stroke (HCC) 12/28/2015   [name] 12/28/2015  . Arthritis    "hands" (12/28/2015)  . GERD (gastroesophageal reflux disease)   . High cholesterol   . Hypertension   . PONV (postoperative nausea and vomiting)    "w/1st hip replacement"   Past Surgical History:  Past Surgical History:  Procedure Laterality Date  . CATARACT EXTRACTION W/ INTRAOCULAR LENS IMPLANT Right ~ 2011  . JOINT REPLACEMENT    . TOTAL HIP ARTHROPLASTY  2007-2008   left-right   Social History:  reports that she has never smoked. She has never used smokeless tobacco. She reports that she does not drink alcohol or use drugs.  Family / Support Systems Marital Status: Divorced Patient Roles: Parent, Volunteer Children: [name]-daughter [phone] Other Supports: Friend and church members Anticipated Caregiver: Daughter can check on her but doesn't plan on staying with her Ability/Limitations of Caregiver: Lives 20 minutes away from Mom-she is retired Medical laboratory scientific officerCaregiver Availability: Intermittent Family  Dynamics: Close with daughter and her friends. She is usually the one who is helping others and not the one needing care. 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    Social History Preferred language: English Religion: Methodist Cultural Background: No issues Education: Runner, broadcasting/film/videoHigh School-CNA certification Read: Yes Write: Yes Employment Status: Retired Age Retired: 72 Fish farm managerLegal Hisotry/Current Legal Issues: No issues Guardian/Conservator: None-according to MD pt is capable of making her own decisions while here.   Abuse/Neglect Physical Abuse: Denies Verbal Abuse: Denies Sexual Abuse: Denies Exploitation of patient/patient's resources: Denies Self-Neglect: Denies  Emotional Status Pt's affect, behavior adn adjustment status: Pt is motivated to do well and is moving well, her main concern is her sensation and not feeling her left side. She states: " It is a weird sensation."  She has always been independent and taken care of others this is not the role she is used too. Recent Psychosocial Issues: other health issues-recently in the hospital with UTI 11/25 Pyschiatric History: No history deferred depression screen due to pt coping well and able to verbalize her concerns and feelings. Will continue to provide support throughout her stay here. Substance Abuse History: No issues  Patient / Family Perceptions, Expectations & Goals Pt/Family understanding of illness & functional limitations: Pt can explain her stroke and deficits. She does talk with the MD and feels her questions and concerns are being addressed. She is hopeful she will do well here and get her sensation back. Premorbid pt/family roles/activities: Mom, Retiree, Church member, friend, etc Anticipated changes in roles/activities/participation: resume Pt/family expectations/goals: Pt states: "  I plan to be independent before I go home."  Daughter states; " I can help some but can't stay with  her."  [employer] IncCommunity Resources Community Agencies: None Premorbid Home Care/DME Agencies: Other (Comment) ([facility]-active pt) Transportation available at discharge: Daughter and friends Resource referrals recommended: Support group (specify)  Discharge Planning Living Arrangements: Alone Support Systems: Children, Other relatives, Friends/neighbors, Psychologist, clinicalChurch/faith community Type of Residence: Private residence Insurance Resources: Harrah[employer] EntertainmentMedicare, Media plannerrivate Insurance (specify) (BCBS Scientist, research (life sciences)& AARP) Surveyor, quantityinancial Resources: Restaurant manager, fast foodocial Security Financial Screen Referred: No Living Expenses: Rent Money Management: Patient Does the patient have any problems obtaining your medications?: No Home Management: Patient Patient/Family Preliminary Plans: Return home with her daughter intermittently chekc on her. Pt will need to be mod/i level to be able to go home alone. Pt plans on doing this whatever her goals are, she feels she can manage due to medical background. Will await team's evalutions and come up with a safe discharge plan. Social Work Anticipated Follow Up Needs: HH/OP, Support Group  Clinical Impression Pleasant spunky patient who is used to being a caregiver to others and not requiring assist. She is ready to regain her independence and get back home. Her daughter is supportive but doesn't plan to stay with her At discharge. Will await team evaluations and work on a safe discharge plan. She should do well here and reach her mod/i level stated goals.  Lucy Chrisupree, Dorothye Berni G 12/31/2015, 12:04 PM

## 2015-12-31 NOTE — Progress Notes (Signed)
[facility] PHYSICAL MEDICINE & REHABILITATION     PROGRESS NOTE  Subjective/Complaints:  Pt laying in bed this AM. She states she did not sleep well, but she has these "spells" at home.  She is ready for therapies.  She is tangential.  ROS: Denies CP, SOB, N/V/D.  Objective: Vital Signs: Blood pressure (!) 148/69, pulse (!) 109, temperature 98.4 F (36.9 C), temperature source Oral, resp. rate 19, height 4\' 11"  (1.499 m), weight 51 kg (112 lb 6.4 oz), SpO2 96 %. No results found.  Recent Labs  12/30/15 1448 12/31/15 0512  WBC 5.0 4.5  HGB 12.1 12.6  HCT 34.1* 35.2*  PLT 249 229    Recent Labs  12/29/15 0459 12/30/15 1448 12/31/15 0512  NA 134*  --  136  K 3.6  --  3.5  CL 101  --  101  GLUCOSE 94  --  108*  BUN 10  --  6  CREATININE 0.86 0.58 0.57  CALCIUM 8.6*  --  8.8*   CBG (last 3)   Recent Labs  12/28/15 0930  GLUCAP 123*    Wt Readings from Last 3 Encounters:  12/30/15 51 kg (112 lb 6.4 oz)  12/28/15 48.1 kg (106 lb)  12/23/15 48.4 kg (106 lb 9.6 oz)    Physical Exam:  BP (!) 148/69 (BP Location: Left Arm)   Pulse (!) 109   Temp 98.4 F (36.9 C) (Oral)   Resp 19   Ht 4\' 11"  (1.499 m)   Wt 51 kg (112 lb 6.4 oz)   SpO2 96%   BMI 22.70 kg/m  Constitutional: She appears well-developed and well-nourished. NAD. HENT: Normocephalic. Atraumatic Eyes: EOM are normal. No discharge. Cardiovascular: Normal rate, regular rhythm and normal heart sounds. No JVD. Respiratory: Effort normal and breath sounds normal.   GI: Soft. Bowel sounds are normal.  Musculoskeletal: She exhibits no edema, no tendernes.. Neurological: She is oriented.  Follows commands  Motor:  4-4+/5 in the left deltoid, biceps, triceps, grip. Dysmetria,  RUE: 5/5.  LLE: 4-4+/5 proximal to distal RLE: 5/5 proximal to distal Decreased sensation to light touch left hemifacial, LUE/LLE Skin: Skin is warm and dry.   Assessment/Plan: 1. Functional deficits secondary to right thalamic  infarct which require 3+ hours per day of interdisciplinary therapy in a comprehensive inpatient rehab setting. Physiatrist is providing close team supervision and 24 hour management of active medical problems listed below. Physiatrist and rehab team continue to assess barriers to discharge/monitor patient progress toward functional and medical goals.  Function:  Bathing Bathing position      Bathing parts      Bathing assist        Upper Body Dressing/Undressing Upper body dressing                    Upper body assist        Lower Body Dressing/Undressing Lower body dressing                                  Lower body assist        Toileting Toileting   Toileting steps completed by patient: Adjust clothing after toileting Toileting steps completed by helper: Performs perineal hygiene Toileting Assistive Devices: Grab bar or rail, Toilet aid  Toileting assist Assist level: Set up/obtain supplies, Touching or steadying assistance (Pt.75%)   Transfers Chair/bed transfer  Secondary school teacherLocomotion Ambulation           Wheelchair          Cognition Comprehension    Expression    Social Interaction    Problem Solving    Memory      Medical Problem List and Plan:  1. Left hemisensory deficits with left sensory ataxia secondary to right thalamic infarct   Begin CIR 2. DVT Prophylaxis/Anticoagulation: Subcutaneous Lovenox. Monitor platelet counts and any signs of bleeding  3. Pain Management: Tylenol as needed  4. Hypertension. Patient receiving Norvasc 5 mg daily, Tenormin 50 mg daily prior to admission.   No current antihypertensive medication, resume as needed   Will monitor with increased activity 5. Neuropsych: This patient is capable of making decisions on her own behalf.  6. Skin/Wound Care: Routine skin checks  7. Fluids/Electrolytes/Nutrition: Routine I&O  BMP within acceptable range 12/1 8. Hyponatremia. Improving  Na+ 136  12//1  Cont to monitor 9. Loose stools. C. difficile specimen negative. No nausea or vomiting  10. Hyperlipidemia. Crestor  11. Sleep disturbance  PRN trazodone  LOS (Days) 1 A FACE TO FACE EVALUATION WAS PERFORMED  Ankit Karis Jubanil Patel 12/31/2015 8:38 AM

## 2015-12-31 NOTE — Care Management Note (Signed)
Inpatient Rehabilitation Center Individual Statement of Services  Patient Name:  [name]  Date:  12/31/2015  Welcome to the Inpatient Rehabilitation Center.  Our goal is to provide you with an individualized program based on your diagnosis and situation, designed to meet your specific needs.  With this comprehensive rehabilitation program, you will be expected to participate in at least 3 hours of rehabilitation therapies Monday-Friday, with modified therapy programming on the weekends.  Your rehabilitation program will include the following services:  Physical Therapy (PT), Occupational Therapy (OT), Speech Therapy (ST), 24 hour per day rehabilitation nursing, Case Management (Social Worker), Rehabilitation Medicine, Nutrition Services and Pharmacy Services  Weekly team conferences will be held on Wedensday to discuss your progress.  Your Social Worker will talk with you frequently to get your input and to update you on team discussions.  Team conferences with you and your family in attendance may also be held.  Expected length of stay: 7-9 days  Overall anticipated outcome: mod/i level  Depending on your progress and recovery, your program may change. Your Social Worker will coordinate services and will keep you informed of any changes. Your Social Worker's name and contact numbers are listed  below.  The following services may also be recommended but are not provided by the Inpatient Rehabilitation Center:   Driving Evaluations  Home Health Rehabiltiation Services  Outpatient Rehabilitation Services    Arrangements will be made to provide these services after discharge if needed.  Arrangements include referral to agencies that provide these services.  Your insurance has been verified to be:  Medicare, [employer]BCBS & AARP Your primary doctor is:  [facility] [place]  Pertinent information will be shared with your doctor and your insurance company.  Social Worker:  Dossie DerBecky Shaneen Reeser,  SW [phone] or ([phone]) [phone]  Information discussed with and copy given to patient by: Lucy Chrisupree, Jamille Yoshino G, 12/31/2015, 10:11 AM

## 2015-12-31 NOTE — Evaluation (Signed)
Physical Therapy Assessment and Plan  Patient Details  Name: [name] MRN: [id number] Date of Birth: [birth year]  PT Diagnosis: Abnormality of gait, Coordination disorder, Difficulty walking, Impaired sensation and Muscle weakness Rehab Potential: Good ELOS: 7-9 days   Today's Date: 12/31/2015 PT Individual Time: 1000-1100 PT Individual Time Calculation (min): 60 min     Problem List: Patient Active Problem List   Diagnosis Date Noted  . Sleep disturbance   . Hemiparesis of right nondominant side due to cerebral infarction ([facility])   . Benign essential HTN   . Thalamic infarct, acute ([place]) 12/30/2015  . Ataxia, late effect of cerebrovascular disease   . Alteration of sensation as late effect of stroke   . CVA (cerebral vascular accident) ([place]) 12/29/2015  . Left sided numbness 12/28/2015  . Nausea & vomiting 12/28/2015  . Diarrhea 12/28/2015  . HTN (hypertension) 12/28/2015  . Acute encephalopathy 12/23/2015  . UTI (urinary tract infection) 12/23/2015  . Hyponatremia 12/23/2015  . Dehydration with hyponatremia 12/23/2015    Past Medical History:  Past Medical History:  Diagnosis Date  . Acute ischemic stroke ([place]) 12/28/2015   Archie Endo 12/28/2015  . Arthritis    "hands" (12/28/2015)  . GERD (gastroesophageal reflux disease)   . High cholesterol   . Hypertension   . PONV (postoperative nausea and vomiting)    "w/1st hip replacement"   Past Surgical History:  Past Surgical History:  Procedure Laterality Date  . CATARACT EXTRACTION W/ INTRAOCULAR LENS IMPLANT Right ~ 2011  . JOINT REPLACEMENT    . TOTAL HIP ARTHROPLASTY  2007-2008   left-right    Assessment & Plan Clinical Impression: [name] is a 82 y.o. right handed female with history of hypertension, hyperlipidemia. Per chart review patient lives alone and was independent prior to admission. One level home with 4 steps to entry. Recent admission for hyponatremia with dehydration presumed secondary to UTI.  Presented 12/28/2015 with left-sided numbness.Sodium 130, troponin negative. CT/MRI showed acute right thalamic nonhemorrhagic infarct. Moderate to severe chronic small vessel ischemic disease. MRA with no emergent large vessel occlusion or stenosis. Ultrasound the abdomen showed cholelithiasis without evidence of cholecystitis. Patient did not receive TPA. Carotid Dopplers with no ICA stenosis. Echocardiogram is pending. Aspirin for CVA prophylaxis. Subcutaneous Lovenox for DVT prophylaxis. Tolerating a regular diet.Patient with bouts of loose stool with C. difficile specimens negative. Physical and occupational therapy evaluations completed with recommendations of physical medicine rehabilitation consult. Patient transferred to CIR on 12/30/2015.   Patient currently requires min with mobility secondary to muscle weakness, decreased cardiorespiratoy endurance, impaired timing and sequencing and decreased coordination and decreased standing balance and decreased balance strategies.  Prior to hospitalization, patient was modified independent  with mobility and lived with Alone (plans to DC to daughter's) in a House home.  Home access is 5Stairs to enter.  Patient will benefit from skilled PT intervention to maximize safe functional mobility, minimize fall risk and decrease caregiver burden for planned discharge home with intermittent assist.  Anticipate patient will benefit from follow up [place] vs OPPT at discharge.  PT - End of Session Activity Tolerance: Tolerates 30+ min activity with multiple rests Endurance Deficit: Yes Endurance Deficit Description: requested seated rest breaks PT Assessment Rehab Potential (ACUTE/IP ONLY): Good PT Patient demonstrates impairments in the following area(s): Balance;Motor;Nutrition;Pain;Perception;Safety;Sensory PT Transfers Functional Problem(s): Bed Mobility;Car;Bed to Chair;Furniture;Floor PT Locomotion Functional Problem(s): Ambulation;Wheelchair  Mobility;Stairs PT Plan PT Intensity: Minimum of 1-2 x/day ,45 to 90 minutes PT Frequency: 5 out of 7  days PT Duration Estimated Length of Stay: 7-9 days PT Treatment/Interventions: Ambulation/gait training;Balance/vestibular training;Community reintegration;Discharge planning;Disease management/prevention;DME/adaptive equipment instruction;Functional mobility training;Neuromuscular re-education;Pain management;Patient/family education;Psychosocial support;Stair training;Therapeutic Activities;Therapeutic Exercise;UE/LE Strength taining/ROM;UE/LE Coordination activities;Wheelchair propulsion/positioning PT Transfers Anticipated Outcome(s): mod I PT Locomotion Anticipated Outcome(s): mod I household, supervision community  PT Recommendation Follow Up Recommendations: Home health PT;Outpatient PT (HH vs OP) Patient destination: Home Equipment Recommended: To be determined Equipment Details: pt owns RW and [facility]  Skilled Therapeutic Intervention Skilled therapeutic intervention initiated after completion of evaluation. Discussed with patient falls risk, safety within room, focus of therapy during stay, possible length of stay, goals, and follow-up therapy. Patient very tangential throughout session but easily redirected to task. Patient requires min A overall without device with increased fear of falling. Patient left in bed to rest with all needs within reach.   PT Evaluation Precautions/Restrictions Precautions Precautions: Fall Precaution Comments: L side numbness  Restrictions Weight Bearing Restrictions: No General Chart Reviewed: Yes Family/Caregiver Present: No  Vital Signs seated BP 143/51, HR 64, Sp02 100% on ra Pain Pain Assessment Pain Assessment: No/denies pain Home Living/Prior Functioning Home Living Available Help at Discharge: Family;Available PRN/intermittently Type of Home: House Home Access: Stairs to enter [employer] of Steps: 5 Entrance Stairs-Rails:  Can reach both Home Layout: Able to live on main level with bedroom/bathroom;One level Bathroom Shower/Tub: Government social research officer Accessibility: Yes Additional Comments: Plans to go to daughters house described above  Lives With: Alone (plans to DC to daughter's) Prior Function Level of Independence: Independent with basic ADLs;Independent with homemaking with ambulation;Independent with transfers;Independent with gait  Able to Take Stairs?: Yes Driving: Yes Vocation: Retired Biomedical scientist: worked in Management consultant Retirement Leisure: Hobbies-yes (Comment) Comments: Likes to read, do puzzles, volunteer, Bingo, in community choir Vision/Perception   No change from baseline  Cognition  WFL Sensation Sensation Light Touch: Impaired Detail Light Touch Impaired Details: Impaired LLE;Impaired LUE Hot/Cold: Appears Intact Proprioception: Impaired Detail Proprioception Impaired Details: Impaired LLE;Impaired LUE Coordination Gross Motor Movements are Fluid and Coordinated: No Fine Motor Movements are Fluid and Coordinated: No Heel Shin Test: dysmetric LLE heel to shin Motor  Motor Motor: Abnormal postural alignment and control  Mobility Bed Mobility Bed Mobility: Rolling Right;Rolling Left;Supine to Sit;Sit to Supine Rolling Right: 5: Supervision Rolling Left: 5: Supervision Supine to Sit: 5: Supervision Sit to Supine: 5: Supervision Transfers Transfers: Yes Stand Pivot Transfers: 4: Min assist;With armrests Locomotion  Ambulation Ambulation: Yes Ambulation/Gait Assistance: 4: Min assist Ambulation Distance (Feet): 50 Feet Assistive device: None Gait Gait: Yes Gait Pattern: Impaired Gait Pattern: Step-through pattern;Trunk flexed Gait velocity: slow Stairs / Additional Locomotion Stairs: Yes Stairs Assistance: 4: Min guard Stair Management Technique: Two rails;Step to pattern;Alternating pattern;Forwards Number of  Stairs: 12 Height of Stairs: 6 Ramp: 4: Min Chemical engineer: Yes Wheelchair Assistance: 4: Min Tour manager: Both upper extremities Wheelchair Parts Management: Needs assistance Distance: 150 ft  Trunk/Postural Assessment  Cervical Assessment Cervical Assessment: Within Functional Limits Thoracic Assessment Thoracic Assessment: Within Functional Limits Lumbar Assessment Lumbar Assessment: Within Functional Limits Postural Control Postural Control: Deficits on evaluation Protective Responses: impaired  Balance Balance Balance Assessed: Yes Standardized Balance Assessment Standardized Balance Assessment: Berg Balance Test Berg Balance Test Sit to Stand: Able to stand  independently using hands Standing Unsupported: Able to stand safely 2 minutes Sitting with Back Unsupported but Feet Supported on Floor or Stool: Able to sit safely and securely 2 minutes Stand to Sit: Sits safely  with minimal use of hands Transfers: Able to transfer with verbal cueing and /or supervision Standing Unsupported with Eyes Closed: Able to stand 10 seconds with supervision Standing Ubsupported with Feet Together: Able to place feet together independently and stand for 1 minute with supervision From Standing, Reach Forward with Outstretched Arm: Loses balance while trying/requires external support From Standing Position, Pick up Object from Floor: Able to pick up shoe, needs supervision From Standing Position, Turn to Look Behind Over each Shoulder: Looks behind one side only/other side shows less weight shift Turn 360 Degrees: Needs assistance while turning Standing Unsupported, Alternately Place Feet on Step/Stool: Needs assistance to keep from falling or unable to try Standing Unsupported, One Foot in Front: Loses balance while stepping or standing Standing on One Leg: Tries to lift leg/unable to hold 3 seconds but remains standing independently Total  Score: 30 Dynamic Standing Balance Dynamic Standing - Balance Support: During functional activity;No upper extremity supported Dynamic Standing - Level of Assistance: 4: Min assist Extremity Assessment   RUE WFL    LUE WFL RLE Assessment RLE Assessment: Within Functional Limits LLE Assessment LLE Assessment: Within Functional Limits   See Function Navigator for Current Functional Status.   Refer to Care Plan for Long Term Goals  Recommendations for other services: None  Discharge Criteria: Patient will be discharged from PT if patient refuses treatment 3 consecutive times without medical reason, if treatment goals not met, if there is a change in medical status, if patient makes no progress towards goals or if patient is discharged from hospital.  The above assessment, treatment plan, treatment alternatives and goals were discussed and mutually agreed upon: by patient  Laretta Alstrom 12/31/2015, 10:49 AM

## 2015-12-31 NOTE — Evaluation (Signed)
Occupational Therapy Assessment and Plan  Patient Details  Name: [name] MRN: [id number] Date of Birth: 07-Oct-1933  OT Diagnosis: muscle weakness (generalized) Rehab Potential: Rehab Potential (ACUTE ONLY): Excellent ELOS: 7-9 days   Today's Date: 12/31/2015  Session 1 OT Individual Time: 0800-0900 OT Individual Time Calculation (min): 60 min   Session 2 OT Individual Time: 1330-1445 OT Individual Time Calculation (min): 75 min     Problem List:  Patient Active Problem List   Diagnosis Date Noted  . Sleep disturbance   . Hemiparesis of right nondominant side due to cerebral infarction ([place])   . Benign essential HTN   . Thalamic infarct, acute ([place]) 12/30/2015  . Ataxia, late effect of cerebrovascular disease   . Alteration of sensation as late effect of stroke   . CVA (cerebral vascular accident) ([place]) 12/29/2015  . Left sided numbness 12/28/2015  . Nausea & vomiting 12/28/2015  . Diarrhea 12/28/2015  . HTN (hypertension) 12/28/2015  . Acute encephalopathy 12/23/2015  . UTI (urinary tract infection) 12/23/2015  . Hyponatremia 12/23/2015  . Dehydration with hyponatremia 12/23/2015   Past Medical History:  Past Medical History:  Diagnosis Date  . Acute ischemic stroke ([place]) 12/28/2015   Archie Endo 12/28/2015  . Arthritis    "hands" (12/28/2015)  . GERD (gastroesophageal reflux disease)   . High cholesterol   . Hypertension   . PONV (postoperative nausea and vomiting)    "w/1st hip replacement"   Past Surgical History:  Past Surgical History:  Procedure Laterality Date  . CATARACT EXTRACTION W/ INTRAOCULAR LENS IMPLANT Right ~ 2011  . JOINT REPLACEMENT    . TOTAL HIP ARTHROPLASTY  2007-2008   left-right    Assessment & Plan Clinical Impression: Patient is a 82 y.o. year old female with recent admission to the hospital on  12/28/2015 with left-sided numbness.Sodium 130, troponin negative. CT/MRI showed acute right thalamic nonhemorrhagic infarct. Moderate to  severe chronic small vessel ischemic disease. MRA with no emergent large vessel occlusion or stenosis. Ultrasound the abdomen showed cholelithiasis without evidence of cholecystitis. Patient did not receive TPA.  Patient transferred to CIR on 12/30/2015 .    Patient currently requires min with basic self-care skills secondary to muscle weakness and L side sensory loss,, decreased coordination, and decreased standing balance and decreased balance strategies.  Prior to hospitalization, patient could complete ADL and IADL with independent .  Patient will benefit from skilled intervention to increase independence with basic self-care skills prior to discharge home with care partner.  Anticipate patient will require intermittent assistance for higher level IADL tasks and follow up home health.  OT - End of Session Endurance Deficit: Yes Endurance Deficit Description: required rest breaks during ADL session OT Assessment Rehab Potential (ACUTE ONLY): Excellent OT Patient demonstrates impairments in the following area(s): Balance;Endurance;Motor;Perception;Sensory;Safety OT Basic ADL's Functional Problem(s): Grooming;Bathing;Dressing;Toileting OT Advanced ADL's Functional Problem(s): Simple Meal Preparation;Light Housekeeping OT Transfers Functional Problem(s): Toilet;Tub/Shower OT Additional Impairment(s): Fuctional Use of Upper Extremity OT Plan OT Intensity: Minimum of 1-2 x/day, 45 to 90 minutes OT Frequency: 5 out of 7 days OT Duration/Estimated Length of Stay: 7-9 days OT Treatment/Interventions: Medical illustrator training;Community reintegration;Discharge planning;DME/adaptive equipment instruction;Functional mobility training;Neuromuscular re-education;Patient/family education;Self Care/advanced ADL retraining;Therapeutic Activities;Therapeutic Exercise;UE/LE Strength taining/ROM;UE/LE Coordination activities OT Basic Self-Care Anticipated Outcome(s): Mod I OT Toileting Anticipated  Outcome(s): Mod I OT Bathroom Transfers Anticipated Outcome(s): Mod I OT Recommendation Patient destination: Home Follow Up Recommendations: Home health OT Equipment Recommended: To be determined   Skilled Therapeutic Intervention  Session 1  Initial eval completed with treatment provided to address functional mobility, standing balance, and adapted bathing/dressing skills. Pt ambulated to and from shower with Min A for balance. Pt with scattered thoughts throughout ADL session requiring verbal cues for re-direction to task. Min A overall for bathing/dressing and slight fine-motor deficits on L hand 2/ 2 sensory loss. Pt left seated in recliner at end of session with needs met and safety belt in place.   Session 2 OT session focused on fine-motor coordination, standing balance, and cognitive assessment. MOCA completed at EOB- w/ pt score of 23/30 indicating mild cognitive deficits in the area of Memory, delayed recall, and executive function. 9-hole PEG test then administered;results below.  R hand- 27 seconds L hand -  45 seconds  Decreased L hand coordination, frequently dropped pegs, and required increased time and attention to place pegs.   Pt then ambulated to therapy gym w/ RW and close supervision. VC to maintain straight line when walking. Standing balance activity using foam pad, close supervision/min A w/o UE support. Pt returned to room and left semi-reclined in bed with needs met and bed alarm on.   OT Evaluation Precautions/Restrictions  Precautions Precautions: Fall Precaution Comments: L side numbness  Restrictions Weight Bearing Restrictions: No Pain  none/denies pain Home Living/Prior Functioning Home Living Living Arrangements: Alone Available Help at Discharge: Family, Available PRN/intermittently Type of Home: House Home Access: Stairs to enter Entrance Stairs-Number of Steps: 5 Entrance Stairs-Rails: Can reach both Home Layout: Able to live on main level with  bedroom/bathroom, One level Bathroom Shower/Tub: Tub/shower unit Bathroom Toilet: Standard Bathroom Accessibility: Yes Additional Comments: Plans to go to daughters house described above  Lives With: Alone (Plan's to DC to daughters home) IADL History Homemaking Responsibilities: Yes Shopping Responsibility: Primary Leisure and Hobbies: 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 [employer]( (SNF) and volunteers at [employer], likes to read and do word puzzles Prior Function Level of Independence: Independent with basic ADLs, Independent with homemaking with ambulation  Able to Take Stairs?: Yes Driving: Yes Vocation: Retired Vocation Requirements: worked in textiles and [employer] Leisure: Hobbies-yes (Comment) Comments: Reports she has been using a RW for the past 3-4 weeks since recent fall ADL ADL ADL Comments: Please see functional navigator Vision/Perception  Vision- History Baseline Vision/History: Wears glasses Wears Glasses: At all times Perception Perception: Within Functional Limits  Cognition Overall Cognitive Status: Difficult to assess Arousal/Alertness: Awake/alert Orientation Level: Person;Place;Situation Person: Oriented Place: Oriented Situation: Oriented Year: 2017 Month: December Day of Week: Correct Memory: Impaired Memory Impairment: Decreased short term memory Immediate Memory Recall: Sock;Blue;Bed Memory Recall: Sock;Blue;Bed Memory Recall Sock: Without Cue Memory Recall Blue: With Cue Memory Recall Bed: With Cue Comments: Racing thoughts, slight difficulty maintaining attention in distracting environment Sensation Sensation Light Touch: Impaired Detail Light Touch Impaired Details: Impaired LLE;Impaired LUE Stereognosis: Impaired by gross assessment Hot/Cold: Appears Intact Proprioception: Impaired Detail Proprioception Impaired Details: Impaired LLE;Impaired LUE Coordination Gross Motor Movements are Fluid and Coordinated: No Fine  Motor Movements are Fluid and Coordinated: No Finger Nose Finger Test: overshooting Balance Dynamic Standing Balance Dynamic Standing - Balance Support: During functional activity;No upper extremity supported Dynamic Standing - Level of Assistance: 4: Min assist Dynamic Standing - Balance Activities: Reaching for objects;Forward lean/weight shifting Extremity/Trunk Assessment RUE Assessment RUE Assessment: Within Functional Limits LUE Assessment LUE Assessment: Exceptions to WFL LUE Strength LUE Overall Strength Comments: 4/5, decreased grip/pinch strength   See Function Navigator for Current Functional Status.   Refer to Care Plan for Long Term   Goals  Recommendations for other services: None  Discharge Criteria: Patient will be discharged from OT if patient refuses treatment 3 consecutive times without medical reason, if treatment goals not met, if there is a change in medical status, if patient makes no progress towards goals or if patient is discharged from hospital.  The above assessment, treatment plan, treatment alternatives and goals were discussed and mutually agreed upon: by patient  Elisabeth S Doe 12/31/2015, 4:22 PM  

## 2016-01-01 NOTE — Progress Notes (Signed)
[name] is a 82 y.o. female 01/13/1934 [id number]  Subjective: No new complaints. No new problems. Slept well.   Objective: Vital signs in last 24 hours: Temp:  [98 F (36.7 C)-98.7 F (37.1 C)] 98.7 F (37.1 C) (12/02 1500) Pulse Rate:  [67-78] 67 (12/02 1500) Resp:  [16-17] 16 (12/02 1500) BP: (120-164)/(42-51) 120/42 (12/02 1500) SpO2:  [98 %] 98 % (12/02 1500) Weight change:  Last BM Date: 12/31/15  Intake/Output from previous day: 12/01 0701 - 12/02 0700 In: 580 [P.O.:580] Out: -  Last cbgs: CBG (last 3)  No results for input(s): GLUCAP in the last 72 hours.   Physical Exam General: No apparent distress   HEENT: not dry Lungs: Normal effort. Lungs clear to auscultation, no crackles or wheezes. Cardiovascular: Regular rate and rhythm, no edema Abdomen: S/NT/ND; BS(+) Musculoskeletal:  unchanged Neurological: No new neurological deficits    Skin: clear  Aging changes Mental state: Alert, oriented, cooperative    Lab Results: BMET    Component Value Date/Time   NA 136 12/31/2015 0512   K 3.5 12/31/2015 0512   CL 101 12/31/2015 0512   CO2 26 12/31/2015 0512   GLUCOSE 108 (H) 12/31/2015 0512   BUN 6 12/31/2015 0512   CREATININE 0.57 12/31/2015 0512   CALCIUM 8.8 (L) 12/31/2015 0512   GFRNONAA >60 12/31/2015 0512   GFRAA >60 12/31/2015 0512   CBC    Component Value Date/Time   WBC 4.5 12/31/2015 0512   RBC 4.07 12/31/2015 0512   HGB 12.6 12/31/2015 0512   HCT 35.2 (L) 12/31/2015 0512   PLT 229 12/31/2015 0512   MCV 86.5 12/31/2015 0512   MCH 31.0 12/31/2015 0512   MCHC 35.8 12/31/2015 0512   RDW 12.4 12/31/2015 0512   LYMPHSABS 0.9 12/31/2015 0512   MONOABS 0.8 12/31/2015 0512   EOSABS 0.0 12/31/2015 0512   BASOSABS 0.0 12/31/2015 0512    Studies/Results: No results found.  Medications: I have reviewed the patient's current medications.  Assessment/Plan:  1. R thalamic CVA - in therapy 2. DVT proph - Lovenox 3. HTN: Norvasc,  Tenormin 4. Low sodium - monitoring labs 5. Dyslipidemia - Crestor   Length of stay, days: 2  Sonda PrimesAlex Khylon Davies , MD 01/01/2016, 10:32 PM

## 2016-01-01 NOTE — Plan of Care (Signed)
Problem: RH PAIN MANAGEMENT Goal: RH STG PAIN MANAGED AT OR BELOW PT'S PAIN GOAL Outcome: Progressing Pain less than or equal to 2.   

## 2016-01-02 ENCOUNTER — Inpatient Hospital Stay (HOSPITAL_COMMUNITY): Payer: Medicare Other | Admitting: Occupational Therapy

## 2016-01-02 ENCOUNTER — Inpatient Hospital Stay (HOSPITAL_COMMUNITY): Payer: Medicare Other

## 2016-01-02 NOTE — Progress Notes (Signed)
[name] is a 82 y.o. female 10/25/1933 [id number]  Subjective: No new complaints. No new problems. Slept ok  Objective: Vital signs in last 24 hours: Temp:  [97.6 F (36.4 C)-97.9 F (36.6 C)] 97.9 F (36.6 C) (12/03 1442) Pulse Rate:  [64-70] 65 (12/03 1442) Resp:  [16-17] 16 (12/03 1442) BP: (124-143)/(36-49) 124/36 (12/03 1442) SpO2:  [96 %-99 %] 99 % (12/03 1442) Weight change:  Last BM Date: 01/01/16  Intake/Output from previous day: 12/02 0701 - 12/03 0700 In: 480 [P.O.:480] Out: -  Last cbgs: CBG (last 3)  No results for input(s): GLUCAP in the last 72 hours.   Physical Exam General: No apparent distress   HEENT: not dry Lungs: Normal effort. Lungs clear to auscultation, no crackles or wheezes. Cardiovascular: Regular rate and rhythm, no edema Abdomen: S/NT/ND; BS(+) Musculoskeletal:  unchanged Neurological: No new neurological deficits Wounds: N/A    Skin: clear  Aging changes Mental state: Alert,  cooperative    Lab Results: BMET    Component Value Date/Time   NA 136 12/31/2015 0512   K 3.5 12/31/2015 0512   CL 101 12/31/2015 0512   CO2 26 12/31/2015 0512   GLUCOSE 108 (H) 12/31/2015 0512   BUN 6 12/31/2015 0512   CREATININE 0.57 12/31/2015 0512   CALCIUM 8.8 (L) 12/31/2015 0512   GFRNONAA >60 12/31/2015 0512   GFRAA >60 12/31/2015 0512   CBC    Component Value Date/Time   WBC 4.5 12/31/2015 0512   RBC 4.07 12/31/2015 0512   HGB 12.6 12/31/2015 0512   HCT 35.2 (L) 12/31/2015 0512   PLT 229 12/31/2015 0512   MCV 86.5 12/31/2015 0512   MCH 31.0 12/31/2015 0512   MCHC 35.8 12/31/2015 0512   RDW 12.4 12/31/2015 0512   LYMPHSABS 0.9 12/31/2015 0512   MONOABS 0.8 12/31/2015 0512   EOSABS 0.0 12/31/2015 0512   BASOSABS 0.0 12/31/2015 0512    Studies/Results: No results found.  Medications: I have reviewed the patient's current medications.  Assessment/Plan:   1. CVA, talamic (R) - in therapies 2. DVT proph - cont w/Lovenox 3.  HTN: Tenormin and Norvasc 4. Hyponatremia. Labs periodically     Length of stay, days: 3  Sonda PrimesAlex Earnestine Shipp , MD 01/02/2016, 4:09 PM

## 2016-01-02 NOTE — Progress Notes (Addendum)
Physical Therapy Note  Patient Details  Name: [name] MRN: [id number] Date of Birth: 02/24/1933 Today's Date: 01/02/2016  1300-1415, 75 min individual  And 1530-1545 15 min individual tx; 1545-1600, 15 min concurrent tx Pain: none tx 1 or tx 2  tx 1:Gait in room to toitet and sink with RW.  Toilet transfer for continent voiding.  Gait to/from therapy room with RW, supervision.  neuromuscular re-education via forced use, multimodal cues for wt shifting to L/R with concurrent trunk righting on Kinetron in sitting and standing with bil UE support> no UE support;. pt tends to extend LLE while still fully wt bearing on RLE.  seated rows with 2# weight for trunk extension; also sitting astride backless bech to promote anterior pelvic tilt.    Improved with use of mirror and max cues. Therapeutic acitvity in sitting on bench matching cards, with extra time, and standing biased-to-L during linen folding activity on table in front.  Pt left resting in w/c with quick release belt applied and all needs within reach.  tx 2:  Toilet transfer as above.  Pt stated she was fatigued.  W/c propulsion x 150' iwht distant supervision, using bil UEs.  Up/down 4 steps x 2 with bil rails, supervision.  neuromuscular re-education via demo, multimodal cues for pelvic dissociation/trunk shortening/lengthening in sitting with feet and back unsupported.  Transferred pt back to bed, supervision, and pt left resting with all needs within reach, bed alarm set.    See function navigator for current status.  Rya Rausch 01/02/2016, 1:25 PM

## 2016-01-02 NOTE — Progress Notes (Signed)
Occupational Therapy Session Note  Patient Details  Name: [name] MRN: [id number] Date of Birth: 07/10/1933  Today's Date: 01/02/2016 OT Individual Time: [id number] and [id number] OT Individual Time Calculation (min): 47 min and 44 min   Short Term Goals: Week 1:  OT Short Term Goal 1 (Week 1): LTG=STG 2/2 estimated short length of stay Week 2:     Skilled Therapeutic Interventions/Progress Updates:   Pt was lying in bed at time of arrival, agreeable to tx but declined bathing/dressing. Skilled session focused on balance and L UE NMR during IADL retraining. Pt completed bedmaking without device in room at Min A-supervision level for slight unsteadiness. Completed task with active integration of L UE with mild coordination deficits noted, but functional with pt tucking in fitted sheets and donning pillow cases. Pt very precise with completion. Afterwards pt completed sweeping in room with broom with dynamic standing demands while reaching out of base of support. OT assisted with dustpan.Pt reports having LH dustpan to sweet floors at home. No LOBs during task. At end of session pt was left in recliner with all needs within reach.   2nd Session 1:1 Tx (44 min) Pt was received sitting up in recliner. Agreeable to tx. Pt participated in folding clothing items that daughter had brought and sorting them into her drawers. Focus on L UE NMR and standing balance without UE support. Afterwards pt ambulated to therapy apartment with min guard. Vacuuming and ironing completed for remediation of deficits stated above. No LOBs. Pt educated on safety with ironing completion at home due to sensation deficits in L UE. Pt exhibited good carryover of safety education when handling warm iron. L UE coordination addressed when bilaterally integrating UEs for manipulation/folding pillow cases during task. At end of session pt ambulated back to room in manner as written above and was left in recliner with all needs  within reach.   Therapy Documentation Precautions:  Precautions Precautions: Fall Precaution Comments: L side numbness  Restrictions Weight Bearing Restrictions: No  Pain: No c/o pain during session    ADL: ADL ADL Comments: Please see functional navigator     See Function Navigator for Current Functional Status.   Therapy/Group: Individual Therapy  Nikky Duba A Audreena Sachdeva 01/02/2016, 12:36 PM

## 2016-01-03 ENCOUNTER — Inpatient Hospital Stay (HOSPITAL_COMMUNITY): Payer: Medicare Other | Admitting: Occupational Therapy

## 2016-01-03 ENCOUNTER — Inpatient Hospital Stay (HOSPITAL_COMMUNITY): Payer: Medicare Other

## 2016-01-03 DIAGNOSIS — K529 Noninfective gastroenteritis and colitis, unspecified: Secondary | ICD-10-CM

## 2016-01-03 MED ORDER — CALCIUM POLYCARBOPHIL 625 MG PO TABS
625.0000 mg | ORAL_TABLET | Freq: Every day | ORAL | Status: DC
  Administered 2016-01-03 – 2016-01-06 (×4): 625 mg via ORAL
  Filled 2016-01-03 (×4): qty 1

## 2016-01-03 MED ORDER — MICONAZOLE NITRATE 2 % VA CREA
1.0000 | TOPICAL_CREAM | Freq: Every day | VAGINAL | Status: DC
  Administered 2016-01-03 – 2016-01-05 (×3): 1 via VAGINAL
  Filled 2016-01-03: qty 45

## 2016-01-03 NOTE — Progress Notes (Signed)
Occupational Therapy Session Note  Patient Details  Name: [name] MRN: [id number] Date of Birth: 05-03-1933  Today's Date: 01/03/2016  Session 1 OT Individual Time: 0900-1000 OT Individual Time Calculation (min): 60 min   Session 2 OT Individual Time: 1300-1415 OT Individual Time Calculation (min): 75 min    Short Term Goals: Week 1:  OT Short Term Goal 1 (Week 1): LTG=STG 2/2 estimated short length of stay  Skilled Therapeutic Interventions/Progress Updates:    Session 1 Pt ambulated to bathroom w/ RW and supervision, successful bowel/bladder, toiletted without assistance, then ambulated into shower. Pt required set-up and supervision for bathing, incorporating forced use of L UE during functional tasks. Educated on safety awareness during self-care tasks and safe activity modifications. Pt left seated in reclined at end of session with needs met.  Session 2  1:1 OT session focused on L NMR, L hand strength/coordination, in-hand manipulation, d/c planning, pt/family education, transfer training, and dynamic balance. Went through L UE HEP using thera-putty and household objects- gave pt handout. Pt then ambulated to therapy gym without AD and overall Min A. Pinch strength, grip strength, and dynamic balance using graded clothes pins and foam mat to stand on. Facilitated lateral trunk extension and rotation with reaching to place clothes pins with overall CGA./ Min A to correct LOB. Pt's daughter joined session and OT educated pt's daughter on plan of care. Pt ambulated to therapy bathroom and practiced tub/shower transfers using tub-transfer bench. Pt demonstrated understanding and completed transfer with supervision. Discussed option of shower chair, however pt required Mod A to step over tub without grab bars (as simulated for home environment). Pt agrees that tub transfer bench is the safest transfer option. Pt left seated in recliner at end of session with needs met and daughter  present.   Therapy Documentation Precautions:  Precautions Precautions: Fall Precaution Comments: L side numbness  Restrictions Weight Bearing Restrictions: No Pain:  none/denies pain ADL: ADL ADL Comments: Please see functional navigator  See Function Navigator for Current Functional Status.   Therapy/Group: Individual Therapy  Valma Cava 01/03/2016, 3:42 PM

## 2016-01-03 NOTE — Progress Notes (Signed)
Physical Therapy Session Note  Patient Details  Name: [name] MRN: [id number] Date of Birth: 02/06/1933  Today's Date: 01/03/2016 PT Individual Time: 1000-1100 PT Individual Time Calculation (min): 60 min    Short Term Goals: Week 1:  PT Short Term Goal 1 (Week 1): = LTGs due to anticipated LOS  Skilled Therapeutic Interventions/Progress Updates:    Session focused on neuro re-ed to address balance, postural control, forced use of LLE/LUE, and coordination during gait without AD, alternating toe taps on 4" step, and dynamic standing balance activity with R foot on 4" step while performing fine motor skills with LUE to manipulate small figurines. Administered TUG (average of 3 trials) indicating fall risk with 34 second average without AD. Dynamic gait through obstacle course to simulate home environment and challenge balance performing turns and sidestepping with overall min assist. Nustep for reciprocal movement pattern re-training x 10 min on level 5 with BUE and BLE progressing to LUE and BLE to force use of LUE and attention to positioning. Pt performed transfers and gait at overall steady assist level. Pt tangential during session and cues for redirection to task throughout session.   Therapy Documentation Precautions:  Precautions Precautions: Fall Precaution Comments: L side numbness  Restrictions Weight Bearing Restrictions: No  Pain: Pain Assessment Pain Assessment: No/denies pain   See Function Navigator for Current Functional Status.   Therapy/Group: Individual Therapy  Karolee StampsGray, Naseem Adler Darrol PokeBrescia  Everson Mott B. Timathy Newberry, PT, DPT  01/03/2016, 12:08 PM

## 2016-01-03 NOTE — Progress Notes (Signed)
[place] PHYSICAL MEDICINE & REHABILITATION     PROGRESS NOTE  Subjective/Complaints:  Pt seen laying in bed.  She slept well overnight.  She states she is sore from all the therapies.  She complains of diarrhea this AM.  ROS: +Dairrhea. Denies CP, SOB, N/V.  Objective: Vital Signs: Blood pressure (!) 145/47, pulse 69, temperature 97.8 F (36.6 C), temperature source Oral, resp. rate 16, height 4\' 11"  (1.499 m), weight 51 kg (112 lb 6.4 oz), SpO2 96 %. No results found. No results for input(s): WBC, HGB, HCT, PLT in the last 72 hours. No results for input(s): NA, K, CL, GLUCOSE, BUN, CREATININE, CALCIUM in the last 72 hours.  Invalid input(s): CO CBG (last 3)  No results for input(s): GLUCAP in the last 72 hours.  Wt Readings from Last 3 Encounters:  12/30/15 51 kg (112 lb 6.4 oz)  12/28/15 48.1 kg (106 lb)  12/23/15 48.4 kg (106 lb 9.6 oz)    Physical Exam:  BP (!) 145/47 (BP Location: Left Arm)   Pulse 69   Temp 97.8 F (36.6 C) (Oral)   Resp 16   Ht 4\' 11"  (1.499 m)   Wt 51 kg (112 lb 6.4 oz)   SpO2 96%   BMI 22.70 kg/m  Constitutional: She appears well-developed and well-nourished. NAD. HENT: Normocephalic. Atraumatic Eyes: EOM are normal. No discharge. Cardiovascular: RRR. No JVD. Respiratory: Effort normal and breath sounds normal.   GI: Soft. Bowel sounds are normal.  Musculoskeletal: She exhibits no edema, no tendernes.. Neurological: She is oriented.  Follows commands  Motor:  4-4+/5 in the left deltoid, biceps, triceps, grip. Dysmetria (stable)  RUE: 5/5.  LLE: 4-4+/5 proximal to distal RLE: 5/5 proximal to distal Decreased sensation to light touch left hemifacial, LUE/LLE Skin: Skin is warm and dry.   Assessment/Plan: 1. Functional deficits secondary to right thalamic infarct which require 3+ hours per day of interdisciplinary therapy in a comprehensive inpatient rehab setting. Physiatrist is providing close team supervision and 24 hour management  of active medical problems listed below. Physiatrist and rehab team continue to assess barriers to discharge/monitor patient progress toward functional and medical goals.  Function:  Bathing Bathing position   Position: Shower  Bathing parts Body parts bathed by patient: Right arm, Left arm, Chest, Front perineal area, Buttocks, Abdomen, Right upper leg, Left upper leg, Right lower leg, Left lower leg Body parts bathed by helper: Back  Bathing assist Assist Level: Touching or steadying assistance(Pt > 75%)      Upper Body Dressing/Undressing Upper body dressing   What is the patient wearing?: Pull over shirt/dress     Pull over shirt/dress - Perfomed by patient: Thread/unthread left sleeve, Thread/unthread right sleeve, Put head through opening, Pull shirt over trunk          Upper body assist Assist Level: Supervision or verbal cues, Set up   Set up : To obtain clothing/put away  Lower Body Dressing/Undressing Lower body dressing   What is the patient wearing?: Pants, Underwear, Non-skid slipper socks Underwear - Performed by patient: Thread/unthread right underwear leg, Thread/unthread left underwear leg, Pull underwear up/down   Pants- Performed by patient: Thread/unthread right pants leg, Thread/unthread left pants leg, Pull pants up/down   Non-skid slipper socks- Performed by patient: Don/doff right sock, Don/doff left sock                    Lower body assist Assist for lower body dressing: Supervision or verbal cues, Set  up   Set up : To obtain clothing/put away  Toileting Toileting   Toileting steps completed by patient: Adjust clothing prior to toileting, Performs perineal hygiene, Adjust clothing after toileting Toileting steps completed by helper: Performs perineal hygiene Toileting Assistive Devices: Grab bar or rail  Toileting assist Assist level: No help/no cues   Transfers Chair/bed transfer   Chair/bed transfer method: Ambulatory Chair/bed  transfer assist level: Touching or steadying assistance (Pt > 75%) Chair/bed transfer assistive device: Armrests     Locomotion Ambulation     Max distance: 150 Assist level: Touching or steadying assistance (Pt > 75%)   Wheelchair   Type: Manual Max wheelchair distance: 100 ft Assist Level: Touching or steadying assistance (Pt > 75%)  Cognition Comprehension Comprehension assist level: Follows complex conversation/direction with no assist  Expression Expression assist level: Expresses complex ideas: With no assist  Social Interaction Social Interaction assist level: Interacts appropriately with others with medication or extra time (anti-anxiety, antidepressant).  Problem Solving Problem solving assist level: Solves basic 90% of the time/requires cueing < 10% of the time  Memory Memory assist level: More than reasonable amount of time    Medical Problem List and Plan:  1. 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 DVT Prophylaxis/Anticoagulation: Subcutaneous Lovenox. Monitor platelet counts and any signs of bleeding  3. Pain Management: Tylenol as needed  4. Hypertension. Patient receiving Norvasc 5 mg daily, Tenormin 50 mg daily prior to admission.   Cont Norvasc 5, atenolol 50   Remains relatively controlled 12/4  Will monitor with increased activity 5. Neuropsych: This patient is capable of making decisions on her own behalf.  6. Skin/Wound Care: Routine skin checks  7. Fluids/Electrolytes/Nutrition: Routine I&O  BMP within acceptable range 12/1  Labs ordered for tomorrow 8. Hyponatremia. Improving  Na+ 136 12//1  Cont to monitor  Labs ordered for tomorrow 9. Freq stools.    C. difficile specimen negative. No nausea or vomiting   Imodium PRN, pt has not requested  Fiber added 12/4 10. Hyperlipidemia. Crestor  11. Sleep disturbance  PRN trazodone  LOS (Days) 4 A FACE TO FACE EVALUATION WAS PERFORMED  Ankit  Karis Jubanil Patel 01/03/2016 9:17 AM

## 2016-01-04 ENCOUNTER — Inpatient Hospital Stay (HOSPITAL_COMMUNITY): Payer: Medicare Other | Admitting: Occupational Therapy

## 2016-01-04 ENCOUNTER — Inpatient Hospital Stay (HOSPITAL_COMMUNITY): Payer: Medicare Other | Admitting: Physical Therapy

## 2016-01-04 LAB — CBC WITH DIFFERENTIAL/PLATELET
BASOS ABS: 0 10*3/uL (ref 0.0–0.1)
Basophils Relative: 0 %
EOS ABS: 0.1 10*3/uL (ref 0.0–0.7)
EOS PCT: 1 %
HCT: 37.7 % (ref 36.0–46.0)
Hemoglobin: 12.9 g/dL (ref 12.0–15.0)
Lymphocytes Relative: 19 %
Lymphs Abs: 1.3 10*3/uL (ref 0.7–4.0)
MCH: 30.4 pg (ref 26.0–34.0)
MCHC: 34.2 g/dL (ref 30.0–36.0)
MCV: 88.7 fL (ref 78.0–100.0)
Monocytes Absolute: 0.9 10*3/uL (ref 0.1–1.0)
Monocytes Relative: 14 %
Neutro Abs: 4.3 10*3/uL (ref 1.7–7.7)
Neutrophils Relative %: 66 %
PLATELETS: 231 10*3/uL (ref 150–400)
RBC: 4.25 MIL/uL (ref 3.87–5.11)
RDW: 12.6 % (ref 11.5–15.5)
WBC: 6.6 10*3/uL (ref 4.0–10.5)

## 2016-01-04 LAB — BASIC METABOLIC PANEL
ANION GAP: 7 (ref 5–15)
BUN: 14 mg/dL (ref 6–20)
CALCIUM: 9.6 mg/dL (ref 8.9–10.3)
CO2: 30 mmol/L (ref 22–32)
Chloride: 101 mmol/L (ref 101–111)
Creatinine, Ser: 1.03 mg/dL — ABNORMAL HIGH (ref 0.44–1.00)
GFR calc Af Amer: 58 mL/min — ABNORMAL LOW (ref >60)
GFR, EST NON AFRICAN AMERICAN: 50 mL/min — AB (ref >60)
Glucose, Bld: 117 mg/dL — ABNORMAL HIGH (ref 65–99)
POTASSIUM: 4.1 mmol/L (ref 3.5–5.1)
SODIUM: 138 mmol/L (ref 135–145)

## 2016-01-04 MED ORDER — AMLODIPINE BESYLATE 5 MG PO TABS
5.0000 mg | ORAL_TABLET | Freq: Once | ORAL | Status: DC
  Filled 2016-01-04 (×2): qty 1

## 2016-01-04 MED ORDER — AMLODIPINE BESYLATE 10 MG PO TABS
10.0000 mg | ORAL_TABLET | Freq: Every day | ORAL | Status: DC
  Administered 2016-01-05 – 2016-01-06 (×2): 10 mg via ORAL
  Filled 2016-01-04 (×2): qty 1

## 2016-01-04 NOTE — Progress Notes (Signed)
PHYSICAL MEDICINE & REHABILITATION     PROGRESS NOTE  Subjective/Complaints:  Pt seen sitting up in bed about to work with OT.  She slept well overnight.  She is tangential.    ROS: Denies CP, SOB, N/V.  Objective: Vital Signs: Blood pressure (!) 157/47, pulse 69, temperature 98.3 F (36.8 C), temperature source Oral, resp. rate 18, height 4\' 11"  (1.499 m), weight 51 kg (112 lb 6.4 oz), SpO2 94 %. No results found. No results for input(s): WBC, HGB, HCT, PLT in the last 72 hours. No results for input(s): NA, K, CL, GLUCOSE, BUN, CREATININE, CALCIUM in the last 72 hours.  Invalid input(s): CO CBG (last 3)  No results for input(s): GLUCAP in the last 72 hours.  Wt Readings from Last 3 Encounters:  12/30/15 51 kg (112 lb 6.4 oz)  12/28/15 48.1 kg (106 lb)  12/23/15 48.4 kg (106 lb 9.6 oz)    Physical Exam:  BP (!) 157/47 (BP Location: Left Arm)   Pulse 69   Temp 98.3 F (36.8 C) (Oral)   Resp 18   Ht 4\' 11"  (1.499 m)   Wt 51 kg (112 lb 6.4 oz)   SpO2 94%   BMI 22.70 kg/m  Constitutional: She appears well-developed and well-nourished. NAD. HENT: Normocephalic. Atraumatic Eyes: EOM are normal. No discharge. Cardiovascular: RRR. No JVD. Respiratory: Effort normal and breath sounds normal.   GI: Soft. Bowel sounds are normal.  Musculoskeletal: She exhibits no edema, no tendernes.. Neurological: She is oriented.  Follows commands  Motor:  4+/5 in the left deltoid, biceps, triceps, grip. Dysmetria (unchanged)  RUE: 5/5.  LLE: 4-4+/5 proximal to distal RLE: 5/5 proximal to distal Skin: Skin is warm and dry.   Assessment/Plan: 1. Functional deficits secondary to right thalamic infarct which require 3+ hours per day of interdisciplinary therapy in a comprehensive inpatient rehab setting. Physiatrist is providing close team supervision and 24 hour management of active medical problems listed below. Physiatrist and rehab team continue to assess barriers to  discharge/monitor patient progress toward functional and medical goals.  Function:  Bathing Bathing position   Position: Shower  Bathing parts Body parts bathed by patient: Right arm, Left arm, Chest, Abdomen, Front perineal area, Buttocks, Right upper leg, Left upper leg, Right lower leg, Left lower leg Body parts bathed by helper: Back  Bathing assist Assist Level: Supervision or verbal cues, Set up   Set up : To obtain items  Upper Body Dressing/Undressing Upper body dressing   What is the patient wearing?: Pull over shirt/dress     Pull over shirt/dress - Perfomed by patient: Thread/unthread right sleeve, Thread/unthread left sleeve, Put head through opening, Pull shirt over trunk          Upper body assist Assist Level: Set up   Set up : To obtain clothing/put away  Lower Body Dressing/Undressing Lower body dressing   What is the patient wearing?: Non-skid slipper socks, Pants, Underwear Underwear - Performed by patient: Thread/unthread right underwear leg, Thread/unthread left underwear leg, Pull underwear up/down   Pants- Performed by patient: Thread/unthread right pants leg, Thread/unthread left pants leg, Pull pants up/down   Non-skid slipper socks- Performed by patient: Don/doff right sock, Don/doff left sock                    Lower body assist Assist for lower body dressing: Supervision or verbal cues   Set up : To obtain clothing/put away  Toileting Toileting   Toileting  steps completed by patient: Adjust clothing prior to toileting, Performs perineal hygiene, Adjust clothing after toileting Toileting steps completed by helper: Adjust clothing prior to toileting, Performs perineal hygiene, Adjust clothing after toileting (per Bonnita NasutiKevin Delleh, NT report) Toileting Assistive Devices: Grab bar or rail  Toileting assist Assist level: Set up/obtain supplies   Transfers Chair/bed transfer   Chair/bed transfer method: Ambulatory Chair/bed transfer assist  level: Touching or steadying assistance (Pt > 75%) Chair/bed transfer assistive device: Armrests     Locomotion Ambulation     Max distance: 150 Assist level: Touching or steadying assistance (Pt > 75%)   Wheelchair   Type: Manual Max wheelchair distance: 100 ft Assist Level: Touching or steadying assistance (Pt > 75%)  Cognition Comprehension Comprehension assist level: Follows complex conversation/direction with no assist  Expression Expression assist level: Expresses complex ideas: With no assist  Social Interaction Social Interaction assist level: Interacts appropriately with others with medication or extra time (anti-anxiety, antidepressant).  Problem Solving Problem solving assist level: Solves basic 90% of the time/requires cueing < 10% of the time  Memory Memory assist level: More than reasonable amount of time    Medical Problem List and Plan:  1. 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 DVT Prophylaxis/Anticoagulation: Subcutaneous Lovenox. Monitor platelet counts and any signs of bleeding  3. Pain Management: Tylenol as needed  4. Hypertension. Patient receiving Norvasc 5 mg daily, Tenormin 50 mg daily prior to admission.   Cont Norvasc 5, increased to 10mg  12/5  Atenolol 50   Will monitor with increased activity 5. Neuropsych: This patient is capable of making decisions on her own behalf.  6. Skin/Wound Care: Routine skin checks  7. Fluids/Electrolytes/Nutrition: Routine I&O  BMP within acceptable range 12/1  Labs ordered for tomorrow 8. Hyponatremia. Improving  Na+ 136 12//1  Cont to monitor  Labs pending 9. Freq stools.    C. difficile specimen negative. No nausea or vomiting   Imodium PRN, pt has not requested  Fiber added 12/4, improving 10. Hyperlipidemia. Crestor  11. Sleep disturbance: Improving  PRN trazodone  LOS (Days) 5 A FACE TO FACE EVALUATION WAS PERFORMED  Ankit Karis Jubanil  Patel 01/04/2016 8:42 AM

## 2016-01-04 NOTE — Progress Notes (Signed)
Occupational Therapy Session Note  Patient Details  Name: [name] MRN: [id number] Date of Birth: 10/17/1933  Today's Date: 01/04/2016  Session 1 OT Individual Time: 0800-0900 OT Individual Time Calculation (min): 60 min   Session 2 OT Individual Time: 7471-5953 OT Individual Time Calculation (min): 73 min   Short Term Goals: Week 1:  OT Short Term Goal 1 (Week 1): LTG=STG 2/2 estimated short length of stay  Skilled Therapeutic Interventions/Progress Updates:    Session 1 Pt finished breakfast seated EOB as MD entered room for assessment. Pt then ambulated from bed<>bathroom w/ RW and supervision, transferred onto toilet with instruction for walker placement. Voided successfully, toileted Mod I. . Session focused on walker safety to gather clothing, open doors, and access home environment. Pt completed morning routine with supervision. Dynamic standing balance at the sink without UE support w/ supervision. Pr left seat in recliner at end of session with needs met.   Session 2 Pt greeted at [employer]. Ambulated to bathroom w/ Rw, successfully voided, and completed toileting w/ mod I.  NMR, core strengthening, weight bearing, and weight shifting in quadruped position. Alternated UE support to reach for objects . Tactile cues to maintain anterior weight shift. Hip extension and strengthening, incorporating fine motor activities in tall kneeling  5 mins x2 with seated rest break in between. In-hand manipulation w/ focus on translation, rotation, and pincer grasp using  L hand. 9-Hole Peg Test administered with improved L hand coordination as presented below.  L hand- 37.63 R hand- 29.20 Pt ambulated to therapy apartment and OT educated on proper walker placement during meal prep. Pt-therapist collaboration completed regarding kitchen modifications at daughter's home during completion of simulated meal prep. Pt returned to room and left semi-reclined in bed with needs met and call  bell in reach.   Therapy Documentation Precautions:  Precautions Precautions: Fall Precaution Comments: L side numbness  Restrictions Weight Bearing Restrictions: No Pain:  none/denies pain ADL: ADL ADL Comments: Please see functional navigator  See Function Navigator for Current Functional Status.   Therapy/Group: Individual Therapy  Valma Cava 01/04/2016, 3:29 PM

## 2016-01-04 NOTE — Progress Notes (Signed)
Physical Therapy Session Note  Patient Details  Name: [name] MRN: [id number] Date of Birth: 06/09/1933  Today's Date: 01/04/2016 PT Individual Time: 0900-1000 PT Individual Time Calculation (min): 60 min   Short Term Goals: Week 1:  PT Short Term Goal 1 (Week 1): = LTGs due to anticipated LOS  Skilled Therapeutic Interventions/Progress Updates:     Patient in recliner working on putty exercises with LUE upon arrival. Session focused on gait training on tiled and carpeted surfaces without device 2 x > 500 ft and increased lateral sway which patient reports is due to leg length discrepancy s/p B hip replacements, dynamic standing balance, forced use and coordination LUE with Dynavision mode A using LUE only and improved reaction time with each trial, stair training up/down 12 (6") stairs using 1 rail, floor transfer using mat table for UE support, and NuStep using BUE/BLE at level 5 x 10 min for L NMR and activity tolerance. Patient requires supervision for all mobility without device with no LOB. Discussed discharge planning, DME (pt owns RW and SPC), and f/u HHPT. Patient left sitting in recliner with all needs within reach.    Therapy Documentation Precautions:  Precautions Precautions: Fall Precaution Comments: L side numbness  Restrictions Weight Bearing Restrictions: No Pain: Pain Assessment Pain Assessment: No/denies pain   See Function Navigator for Current Functional Status.   Therapy/Group: Individual Therapy  Kerney ElbeVarner, Jovan Schickling A 01/04/2016, 9:53 AM

## 2016-01-04 NOTE — Discharge Instructions (Signed)
Inpatient Rehab Discharge Instructions  Ardith DarkJenie S Doebler Discharge date and time: No discharge date for patient encounter.   Activities/Precautions/ Functional Status: Activity: activity as tolerated Diet: regular diet Wound Care: none needed Functional status:  ___ No restrictions     ___ Walk up steps independently ___ 24/7 supervision/assistance   ___ Walk up steps with assistance ___ Intermittent supervision/assistance  ___ Bathe/dress independently ___ Walk with walker     _x__ Bathe/dress with assistance ___ Walk Independently    ___ Shower independently ___ Walk with assistance    ___ Shower with assistance ___ No alcohol     ___ Return to work/school ________  Special Instructions:  No driving   COMMUNITY REFERRALS UPON DISCHARGE:    Home Health:   PT, OT, RN  Agency:[facility]  Phone:[phone]   Date of last service:01/05/2016  Medical Equipment/Items Ordered:NO NEEDS PLANS TO BORROW TUB BENCH FROM SISTER IN-LA[place]     GENERAL COMMUNITY RESOURCES FOR PATIENT/FAMILY: Support Groups:[facility] SUPPORT GROUP EVERY SECOND THURSDAY @ 3:00-4:00 PM ON THE REHAB UNIT QUESTIONS CONTACT KATIE [phone]  STROKE/TIA DISCHARGE INSTRUCTIONS SMOKING Cigarette smoking nearly doubles your risk of having a stroke & is the single most alterable risk factor  If you smoke or have smoked in the last 12 months, you are advised to quit smoking for your health.  Most of the excess cardiovascular risk related to smoking disappears within a year of stopping.  Ask you doctor about anti-smoking medications  [place] Quit Line: 1-800-QUIT NOW  Free Smoking Cessation Classes [phone]  CHOLESTEROL Know your levels; limit fat & cholesterol in your diet  Lipid Panel     Component Value Date/Time   CHOL 175 12/29/2015 0459   TRIG 171 (H) 12/29/2015 0459   HDL 36 (L) 12/29/2015 0459   CHOLHDL 4.9 12/29/2015 0459   VLDL 34 12/29/2015 0459   LDLCALC 105 (H) 12/29/2015 0459      Many  patients benefit from treatment even if their cholesterol is at goal.  Goal: Total Cholesterol (CHOL) less than 160  Goal:  Triglycerides (TRIG) less than 150  Goal:  HDL greater than 40  Goal:  LDL (LDLCALC) less than 100   BLOOD PRESSURE American Stroke Association blood pressure target is less that 120/80 mm/Hg  Your discharge blood pressure is:  BP: (!) 157/47  Monitor your blood pressure  Limit your salt and alcohol intake  Many individuals will require more than one medication for high blood pressure  DIABETES (A1c is a blood sugar average for last 3 months) Goal HGBA1c is under 7% (HBGA1c is blood sugar average for last 3 months)  Diabetes: No known diagnosis of diabetes    Lab Results  Component Value Date   HGBA1C 5.1 12/29/2015     Your HGBA1c can be lowered with medications, healthy diet, and exercise.  Check your blood sugar as directed by your physician  Call your physician if you experience unexplained or low blood sugars.  PHYSICAL ACTIVITY/REHABILITATION Goal is 30 minutes at least 4 days per week  Activity: Increase activity slowly, Therapies: Physical Therapy: Home Health Return to work:   Activity decreases your risk of heart attack and stroke and makes your heart stronger.  It helps control your weight and blood pressure; helps you relax and can improve your mood.  Participate in a regular exercise program.  Talk with your doctor about the best form of exercise for you (dancing, walking, swimming, cycling).  DIET/WEIGHT Goal is to maintain a healthy  weight  Your discharge diet is: Diet regular Room service appropriate? Yes; Fluid consistency: Thin  liquids Your height is:  Height: 4\' 11"  (149.9 cm) Your current weight is: Weight: 51 kg (112 lb 6.4 oz) Your Body Mass Index (BMI) is:  BMI (Calculated): 22.7  Following the type of diet specifically designed for you will help prevent another stroke.  Your goal weight range is:    Your goal Body Mass  Index (BMI) is 19-24.  Healthy food habits can help reduce 3 risk factors for stroke:  High cholesterol, hypertension, and excess weight.  RESOURCES Stroke/Support Group:  Call [phone]   STROKE EDUCATION PROVIDED/REVIEWED AND GIVEN TO PATIENT Stroke warning signs and symptoms How to activate emergency medical system (call 911). Medications prescribed at discharge. Need for follow-up after discharge. Personal risk factors for stroke. Pneumonia vaccine given:  Flu vaccine given:  My questions have been answered, the writing is legible, and I understand these instructions.  I will adhere to these goals & educational materials that have been provided to me after my discharge from the hospital.     My questions have been answered and I understand these instructions. I will adhere to these goals and the provided educational materials after my discharge from the hospital.  Patient/Caregiver Signature _______________________________ Date __________  Clinician Signature _______________________________________ Date __________  Please bring this form and your medication list with you to all your follow-up doctor's appointments.

## 2016-01-04 NOTE — Progress Notes (Addendum)
Social Work Patient ID: [name], female   DOB: [birth year], 82 y.o.   MRN: [id number]  Team feels pt will be ready for discharge Thursday, have met her goals. MD agreeable to this plan.  Pt aware and pleased with her progress while here. Plans to borrow her sister in-laws tub bench. No equipment needs.

## 2016-01-05 ENCOUNTER — Inpatient Hospital Stay (HOSPITAL_COMMUNITY): Payer: Medicare Other | Admitting: Occupational Therapy

## 2016-01-05 ENCOUNTER — Inpatient Hospital Stay (HOSPITAL_COMMUNITY): Payer: Medicare Other | Admitting: Physical Therapy

## 2016-01-05 DIAGNOSIS — N179 Acute kidney failure, unspecified: Secondary | ICD-10-CM

## 2016-01-05 NOTE — Progress Notes (Signed)
Physical Therapy Discharge Summary  Patient Details  Name: [name] MRN: [id number] Date of Birth: 22-Nov-1933  Today's Date: 01/05/2016 PT Individual Time: 0803-0900 and 1505-1540 PT Individual Time Calculation (min): 57 min and 35 min   Patient has met 10 of 10 long term goals due to improved activity tolerance, improved balance, improved postural control, increased strength, decreased pain, ability to compensate for deficits, functional use of  left upper extremity and left lower extremity and improved coordination.  Patient to discharge at an ambulatory level Modified Independent.   Patient's care partner is independent to provide the necessary physical assistance at discharge.  Reasons goals not met: NA  Recommendation:  Patient will benefit from ongoing skilled PT services in home health setting to continue to advance safe functional mobility, address ongoing impairments in L coordination, standing balance, activity tolerance, strength, and minimize fall risk.  Equipment: No equipment provided-pt owns RW  Reasons for discharge: treatment goals met and discharge from hospital  Patient/family agrees with progress made and goals achieved: Yes  Skilled Therapeutic Intervention Treatment 1: Patient mod I without device for toileting, UB/LB dressing sitting EOB after retrieving clothing in room, gait in controlled and home environments, bed mobility, transfers, and stairs and requires supervision for gait on uneven surfaces, floor transfers, and retrieving objects from floor. Reassessed Berg Balance Scale with score of 48/56 improved from 30/56 on evaluation and TUG x 3 trials without AD with avg time of 11.67 sec improved from avg of 34 sec on 01/03/16. Performed 5TSS = 11 sec. Performed NuStep using BUE/BLE at level 5 x 10 min for L NMR and reciprocal pattern retraining. Patient left in bed to rest with all needs in reach and bed alarm on.   Treatment 2: Patient in bed upon arrival.  Performed toileting in room with mod I. Gait to and from therapy gym without device mod I. Instructed in OTAGO level C HEP to challenge dynamic standing balance with UE support as needed and provided handout. Patient with no further questions/concerns regarding discharge and made mod I in room.   PT Discharge Precautions/RestrictionsRestrictions Weight Bearing Restrictions: No Pain Pain Assessment Pain Assessment: No/denies pain Vision/Perception   No change from baseline  Cognition Orientation Level: Oriented X4 Sensation Sensation Light Touch: Impaired Detail Light Touch Impaired Details: Impaired LLE;Impaired LUE Stereognosis: Appears Intact Hot/Cold: Appears Intact Proprioception: Appears Intact Coordination Gross Motor Movements are Fluid and Coordinated: No Fine Motor Movements are Fluid and Coordinated: No Heel Shin Test: improved from eval Motor  Motor Motor: Abnormal postural alignment and control  Mobility Bed Mobility Bed Mobility: Rolling Right;Rolling Left;Supine to Sit;Sit to Supine Rolling Right: 6: Modified independent (Device/Increase time) Rolling Left: 6: Modified independent (Device/Increase time) Supine to Sit: 6: Modified independent (Device/Increase time) Sit to Supine: 6: Modified independent (Device/Increase time) Transfers Transfers: Yes Stand Pivot Transfers: 6: Modified independent (Device/Increase time) Locomotion  Ambulation Ambulation: Yes Ambulation/Gait Assistance: 6: Modified independent (Device/Increase time) Ambulation Distance (Feet): 300 Feet Assistive device: None Gait Gait: Yes Gait Pattern: Impaired Gait Pattern: Step-through pattern;Wide base of support;Decreased stance time - left;Decreased step length - right Gait velocity: decreased Stairs / Additional Locomotion Stairs: Yes Stairs Assistance: 6: Modified independent (Device/Increase time) Stair Management Technique: Two rails;Alternating pattern;Step to  pattern;Forwards Number of Stairs: 12 Height of Stairs: 6 Ramp: 6: Modified independent (Device) Wheelchair Mobility Wheelchair Mobility: No  Trunk/Postural Assessment  Cervical Assessment Cervical Assessment: Within Functional Limits Thoracic Assessment Thoracic Assessment: Within Functional Limits Lumbar Assessment Lumbar Assessment: Within  Functional Limits Postural Control Postural Control: Within Functional Limits Protective Responses: improved from eval  Balance Balance Balance Assessed: Yes Standardized Balance Assessment Standardized Balance Assessment: Timed Up and Go Test;Berg Balance Test Berg Balance Test Sit to Stand: Able to stand without using hands and stabilize independently Standing Unsupported: Able to stand safely 2 minutes Sitting with Back Unsupported but Feet Supported on Floor or Stool: Able to sit safely and securely 2 minutes Stand to Sit: Sits safely with minimal use of hands Transfers: Able to transfer safely, minor use of hands Standing Unsupported with Eyes Closed: Able to stand 10 seconds safely Standing Ubsupported with Feet Together: Able to place feet together independently and stand for 1 minute with supervision From Standing, Reach Forward with Outstretched Arm: Can reach confidently >25 cm (10") From Standing Position, Pick up Object from Floor: Able to pick up shoe, needs supervision From Standing Position, Turn to Look Behind Over each Shoulder: Looks behind from both sides and weight shifts well Turn 360 Degrees: Able to turn 360 degrees safely but slowly Standing Unsupported, Alternately Place Feet on Step/Stool: Able to stand independently and complete 8 steps >20 seconds Standing Unsupported, One Foot in Front: Able to plae foot ahead of the other independently and hold 30 seconds Standing on One Leg: Able to lift leg independently and hold equal to or more than 3 seconds Total Score: 48 Timed Up and Go Test TUG: Normal TUG Normal TUG  (seconds): 12.67 (avg of 3 trials without AD) Dynamic Standing Balance Dynamic Standing - Balance Support: During functional activity;No upper extremity supported Dynamic Standing - Level of Assistance: 6: Modified independent (Device/Increase time)  Five times Sit to Stand Test (FTSS) Method: Use a straight back chair with a solid seat that is 16-18" high. Ask participant to sit on the chair with arms folded across their chest.   Instructions: "Stand up and sit down as quickly as possible 5 times, keeping your arms folded across your chest."   Measurement: Stop timing when the participant stands the 5th time.  TIME: __11____ (in seconds)  Times > 13.6 seconds is associated with increased disability and morbidity (Guralnik, 2000) Times > 15 seconds is predictive of recurrent falls in healthy individuals aged 40 and older (Buatois, et al., 2008) Normal performance values in community dwelling individuals aged 58 and older (Bohannon, 2006): o 60-69 years: 11.4 seconds o 70-79 years: 12.6 seconds o 80-89 years: 14.8 seconds  MCID: ? 2.3 seconds for Vestibular Disorders (Meretta, 2006) Extremity Assessment  RLE Assessment RLE Assessment: Within Functional Limits LLE Assessment LLE Assessment: Within Functional Limits   See Function Navigator for Current Functional Status.  Carney Living A 01/05/2016, 8:52 AM

## 2016-01-05 NOTE — Progress Notes (Signed)
Social Work Patient ID: [name], female   DOB: 03/31/1933, 82 y.o.   MRN: [id number]  Pt feels ready to go home tomorrow and aware of team conference and agreeable. She plans to go to her Daughter's for a short time then back home when doing better.

## 2016-01-05 NOTE — Progress Notes (Signed)
Social Work Lucy Chrisebecca G Hart Haas, LCSW Social Worker Signed   Patient Care Conference Date of Service: 01/05/2016  3:43 PM      Hide copied text Hover for attribution information Inpatient RehabilitationTeam Conference and Plan of Care Update Date: 01/05/2016   Time: 2:00 PM      Patient Name: [name]      Medical Record Number: [id number]  Date of Birth: 01/29/1934 Sex: Female         Room/Bed: 4M05C/4M05C-01 Payor Info: Payor: MEDICARE / Plan: MEDICARE PART A AND B / Product Type: *No Product type* /     Admitting Diagnosis: stroke  Admit Date/Time:  12/30/2015  1:51 PM Admission Comments: No comment available    Primary Diagnosis:  <principal problem not specified> Principal Problem: <principal problem not specified>       Patient Active Problem List    Diagnosis Date Noted  . AKI (acute kidney injury) (HCC)    . Frequent stools    . Sleep disturbance    . Hemiparesis of right nondominant side due to cerebral infarction (HCC)    . Benign essential HTN    . 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 Ataxia, late effect of cerebrovascular disease    . Alteration of sensation as late effect of stroke    . CVA (cerebral vascular accident) (HCC) 12/29/2015  . Left sided numbness 12/28/2015  . Nausea & vomiting 12/28/2015  . Diarrhea 12/28/2015  . HTN (hypertension) 12/28/2015  . Acute encephalopathy 12/23/2015  . UTI (urinary tract infection) 12/23/2015  . Hyponatremia 12/23/2015  . Dehydration with hyponatremia 12/23/2015      Expected Discharge Date: Expected Discharge Date: 01/06/16   Team Members Present: Physician leading conference: Dr. Maryla MorrowAnkit Patel Social Worker Present: Dossie DerBecky Aubreyana Saltz, LCSW Nurse Present: Carmie EndAngie Joyce, RN PT Present: Bayard Huggerebecca Varner, PT OT Present: Other (comment) Gentry Fitz(Elisabeth Doe-OT) SLP Present: Jackalyn LombardNicole Page, SLP PPS Coordinator present : Tora DuckMarie Noel, RN, CRRN       Current Status/Progress Goal Weekly Team Focus  Medical   Left hemisensory  deficits with left sensory ataxia secondary to right thalamic infarct  HTN, hyponatremia, AKI, safety, mobility  See above   Bowel/Bladder   continent of bowel and bladder. Loose bowels with fibercon added. LBM 12-5  min assist  moniotor bowel and bladder and continue POC   Swallow/Nutrition/ Hydration             ADL's   Mod I  Mod I  standing balance, L NMR, fine motor coordination, modified bathing/dressing   Mobility   mod I-supervision  mod I-supervision  L NMR, standing balance, mobility training, activity tolerance, attention to task, pt education   Communication             Safety/Cognition/ Behavioral Observations           Pain   denies         Skin   scattered bruising  free of skin breakdown and infection  assess skin q shift       *See Care Plan and progress notes for long and short-term goals.   Barriers to Discharge: AKI, HTN, safety, mobility   Possible Resolutions to Barriers:  Therapies, encourage fluids   Discharge Planning/Teaching Needs:  HOme to daughter's home where daughter can be tehre in case needed. Pt reached mod/i level and ready to go home tomorrow      Team Discussion:  Pt reaching goals of mod/i level. MD feels she is medically stable-pushing po  fluids due to not drinking enough. Ready for DC tomorrow  Revisions to Treatment Plan:  DC tomorrow    Continued Need for Acute Rehabilitation Level of Care: The patient requires daily medical management by a physician with specialized training in physical medicine and rehabilitation for the following conditions: Daily direction of a multidisciplinary physical rehabilitation program to ensure safe treatment while eliciting the highest outcome that is of practical value to the patient.: Yes Daily medical management of patient stability for increased activity during participation in an intensive rehabilitation regime.: Yes Daily analysis of laboratory values and/or radiology reports with any subsequent need for  medication adjustment of medical intervention for : Neurological problems;Other   [name], [name] 01/05/2016, 3:43 PM       Patient ID: [name], female   DOB: 08/21/1933, 82 y.o.   MRN: [id number]

## 2016-01-05 NOTE — Progress Notes (Signed)
[facility] PHYSICAL MEDICINE & REHABILITATION     PROGRESS NOTE  Subjective/Complaints:  Pt seen sitting up in bed this AM.  She slept fairly, which is her norm, but she states she feels better.   ROS: Denies CP, SOB, N/V.  Objective: Vital Signs: Blood pressure (!) 124/47, pulse 65, temperature 98.1 F (36.7 C), temperature source Oral, resp. rate 18, height 4\' 11"  (1.499 m), weight 51 kg (112 lb 6.4 oz), SpO2 96 %. No results found.  Recent Labs  01/04/16 1047  WBC 6.6  HGB 12.9  HCT 37.7  PLT 231    Recent Labs  01/04/16 1047  NA 138  K 4.1  CL 101  GLUCOSE 117*  BUN 14  CREATININE 1.03*  CALCIUM 9.6   CBG (last 3)  No results for input(s): GLUCAP in the last 72 hours.  Wt Readings from Last 3 Encounters:  12/30/15 51 kg (112 lb 6.4 oz)  12/28/15 48.1 kg (106 lb)  12/23/15 48.4 kg (106 lb 9.6 oz)    Physical Exam:  BP (!) 124/47 (BP Location: Left Arm)   Pulse 65   Temp 98.1 F (36.7 C) (Oral)   Resp 18   Ht 4\' 11"  (1.499 m)   Wt 51 kg (112 lb 6.4 oz)   SpO2 96%   BMI 22.70 kg/m  Constitutional: She appears well-developed and well-nourished. NAD. HENT: Normocephalic. Atraumatic Eyes: EOM are normal. No discharge. Cardiovascular: RRR. No JVD. Respiratory: Effort normal and breath sounds normal.   GI: Soft. Bowel sounds are normal.  Musculoskeletal: She exhibits no edema, no tendernes.. Neurological: She is oriented.  Follows commands  Motor:  4+/5 in the left deltoid, biceps, triceps, grip. Dysmetria (stable)  RUE: 5/5.  LLE: 4-4+/5 proximal to distal RLE: 5/5 proximal to distal Skin: Skin is warm and dry.   Assessment/Plan: 1. Functional deficits secondary to right thalamic infarct which require 3+ hours per day of interdisciplinary therapy in a comprehensive inpatient rehab setting. Physiatrist is providing close team supervision and 24 hour management of active medical problems listed below. Physiatrist and rehab team continue to assess  barriers to discharge/monitor patient progress toward functional and medical goals.  Function:  Bathing Bathing position   Position: Shower  Bathing parts Body parts bathed by patient: Right arm, Left arm, Chest, Abdomen, Front perineal area, Buttocks, Right upper leg, Left upper leg, Right lower leg, Left lower leg Body parts bathed by helper: Back  Bathing assist Assist Level: Supervision or verbal cues, Set up   Set up : To obtain items  Upper Body Dressing/Undressing Upper body dressing   What is the patient wearing?: Pull over shirt/dress, Bra Bra - Perfomed by patient: Thread/unthread right bra strap, Thread/unthread left bra strap, Hook/unhook bra (pull down sports bra)   Pull over shirt/dress - Perfomed by patient: Thread/unthread right sleeve, Thread/unthread left sleeve, Put head through opening, Pull shirt over trunk          Upper body assist Assist Level: No help, No cues   Set up : To obtain clothing/put away  Lower Body Dressing/Undressing Lower body dressing   What is the patient wearing?: Non-skid slipper socks, Pants, Underwear Underwear - Performed by patient: Thread/unthread right underwear leg, Thread/unthread left underwear leg, Pull underwear up/down   Pants- Performed by patient: Thread/unthread right pants leg, Thread/unthread left pants leg, Pull pants up/down   Non-skid slipper socks- Performed by patient: Don/doff left sock, Don/doff right sock  Lower body assist Assist for lower body dressing: More than reasonable time   Set up : To obtain clothing/put away  Toileting Toileting Toileting activity did not occur: Safety/medical concerns Toileting steps completed by patient: Adjust clothing after toileting, Adjust clothing prior to toileting, Performs perineal hygiene Toileting steps completed by helper: Adjust clothing prior to toileting, Performs perineal hygiene, Adjust clothing after toileting (per Bonnita NasutiKevin Delleh, NT  report) Toileting Assistive Devices: Grab bar or rail, Other (comment)  Toileting assist Assist level: Supervision or verbal cues   Transfers Chair/bed transfer   Chair/bed transfer method: Ambulatory Chair/bed transfer assist level: Supervision or verbal cues Chair/bed transfer assistive device: Armrests     Locomotion Ambulation     Max distance: 500 ft Assist level: Supervision or verbal cues   Wheelchair   Type: Manual Max wheelchair distance: 100 ft Assist Level: Touching or steadying assistance (Pt > 75%)  Cognition Comprehension Comprehension assist level: Follows complex conversation/direction with no assist  Expression Expression assist level: Expresses complex ideas: With extra time/assistive device  Social Interaction Social Interaction assist level: Interacts appropriately with others with medication or extra time (anti-anxiety, antidepressant).  Problem Solving Problem solving assist level: Solves basic problems with no assist  Memory Memory assist level: More than reasonable amount of time    Medical Problem List and Plan:  1. Left hemisensory deficits with left sensory ataxia secondary to right thalamic infarct   Cont CIR, plan for d/c tomorrow  Will see pt for transitional care management in 1-2 weeks 2. DVT Prophylaxis/Anticoagulation: Subcutaneous Lovenox. Monitor platelet counts and any signs of bleeding  3. Pain Management: Tylenol as needed  4. Hypertension. Patient receiving Norvasc 5 mg daily, Tenormin 50 mg daily prior to admission.   Cont Norvasc 5, increased to 10mg  12/5  Cont Atenolol 50   Improving  Will monitor with increased activity 5. Neuropsych: This patient is capable of making decisions on her own behalf.  6. Skin/Wound Care: Routine skin checks  7. Fluids/Electrolytes/Nutrition: Routine I&O 8. Hyponatremia. Resolved  Na+ 138 12/5  Cont to monitor 9. Freq stools.    C. difficile specimen negative. No nausea or vomiting   Imodium PRN,  pt has not requested  Fiber added 12/4, improving 10. Hyperlipidemia. Crestor  11. Sleep disturbance: Improving  PRN trazodone 12. AKI  Cr 1.03 on 12/5  Encourage fluid intake  LOS (Days) 6 A FACE TO FACE EVALUATION WAS PERFORMED  Dimitri Dsouza Karis Jubanil Noa Galvao 01/05/2016 8:10 AM

## 2016-01-05 NOTE — Discharge Summary (Signed)
  Discharge summary job [id number]

## 2016-01-05 NOTE — Progress Notes (Signed)
Recreational Therapy Session Note  Patient Details  Name: [name] MRN: [id number] Date of Birth: 12/01/1933 Today's Date: 01/05/2016  Pain: no c/o Skilled Therapeutic Interventions/Progress Updates: Pt participated in animal assisted activity/therapy seated EOB with supervision. Britteny Fiebelkorn 01/05/2016, 3:48 PM

## 2016-01-05 NOTE — Progress Notes (Signed)
Occupational Therapy Session Note  Patient Details  Name: [name] MRN: [id number] Date of Birth: 1933-08-12  Today's Date: 01/05/2016 OT Individual Time: 1000-1100 OT Individual Time Calculation (min): 60 min   Short Term Goals: Week 1:  OT Short Term Goal 1 (Week 1): LTG=STG 2/2 estimated short length of stay  Skilled Therapeutic Interventions/Progress Updates:    Treatment session focused on modified bathing/dressing, safety awareness w/ morning routine, functional use of L Ue, and L hand strength/coordination. OT reviewed safety awareness to decrease risk of falls during functional transfers and self-care tasks. Bathing/dressing completed with mod I. Grooming at the sink with RW w/ improved standing balance and no episode of LOB. Pt then completed thera-putty exercises w/ L hand, using medium soft red putty. Pt left seated at EOB at end of session with needs met.   Therapy Documentation Precautions:  Precautions Precautions: Fall Precaution Comments: L side numbness  Restrictions Weight Bearing Restrictions: No Pain: Pain Assessment Pain Assessment: No/denies pain Pain Score: 0-No pain ADL: ADL ADL Comments: Please see functional navigator  See Function Navigator for Current Functional Status.   Therapy/Group: Individual Therapy  Valma Cava 01/05/2016, 12:50 PM

## 2016-01-05 NOTE — Patient Care Conference (Signed)
Inpatient RehabilitationTeam Conference and Plan of Care Update Date: 01/05/2016   Time: 2:00 PM    Patient Name: [name]      Medical Record Number: [id number]  Date of Birth: 09/29/1933 Sex: Female         Room/Bed: 4M05C/4M05C-01 Payor Info: Payor: MEDICARE / Plan: MEDICARE PART A AND B / Product Type: *No Product type* /    Admitting Diagnosis: stroke  Admit Date/Time:  12/30/2015  1:51 PM Admission Comments: No comment available   Primary Diagnosis:  <principal problem not specified> Principal Problem: <principal problem not specified>  Patient Active Problem List   Diagnosis Date Noted  . AKI (acute kidney injury) (HCC)   . Frequent stools   . Sleep disturbance   . 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 Benign essential HTN   . 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 Ataxia, late effect of cerebrovascular disease   . Alteration of sensation as late effect of stroke   . CVA (cerebral vascular accident) (HCC) 12/29/2015  . Left sided numbness 12/28/2015  . Nausea & vomiting 12/28/2015  . Diarrhea 12/28/2015  . HTN (hypertension) 12/28/2015  . Acute encephalopathy 12/23/2015  . UTI (urinary tract infection) 12/23/2015  . Hyponatremia 12/23/2015  . Dehydration with hyponatremia 12/23/2015    Expected Discharge Date: Expected Discharge Date: 01/06/16  Team Members Present: Physician leading conference: Dr. Maryla MorrowAnkit Patel Social Worker Present: Dossie DerBecky Kassidee Narciso, LCSW Nurse Present: Carmie EndAngie Joyce, RN PT Present: Bayard Huggerebecca Varner, PT OT Present: Other (comment) Gentry Fitz(Elisabeth Doe-OT) SLP Present: Jackalyn LombardNicole Page, SLP PPS Coordinator present : Tora DuckMarie Noel, RN, CRRN     Current Status/Progress Goal Weekly Team Focus  Medical   Left hemisensory deficits with left sensory ataxia secondary to right thalamic infarct  HTN, hyponatremia, AKI, safety, mobility  See above   Bowel/Bladder   continent of bowel and bladder. Loose bowels with fibercon  added. LBM 12-5  min assist  moniotor bowel and bladder and continue POC   Swallow/Nutrition/ Hydration             ADL's   Mod I  Mod I  standing balance, L NMR, fine motor coordination, modified bathing/dressing   Mobility   mod I-supervision  mod I-supervision  L NMR, standing balance, mobility training, activity tolerance, attention to task, pt education   Communication             Safety/Cognition/ Behavioral Observations            Pain   denies         Skin   scattered bruising  free of skin breakdown and infection  assess skin q shift      *See Care Plan and progress notes for long and short-term goals.  Barriers to Discharge: AKI, HTN, safety, mobility    Possible Resolutions to Barriers:  Therapies, encourage fluids    Discharge Planning/Teaching Needs:  HOme to daughter's home where daughter can be tehre in case needed. Pt reached mod/i level and ready to go home tomorrow      Team Discussion:  Pt reaching goals of mod/i level. MD feels she is medically stable-pushing po fluids due to not drinking enough. Ready for DC tomorrow  Revisions to Treatment Plan:  DC tomorrow   Continued Need for Acute Rehabilitation Level of Care: The patient requires daily medical management by a physician with specialized training in physical medicine and rehabilitation for the following conditions: Daily direction of a multidisciplinary  physical rehabilitation program to ensure safe treatment while eliciting the highest outcome that is of practical value to the patient.: Yes Daily medical management of patient stability for increased activity during participation in an intensive rehabilitation regime.: Yes Daily analysis of laboratory values and/or radiology reports with any subsequent need for medication adjustment of medical intervention for : Neurological problems;Other  Lucy Chris 01/05/2016, 3:43 PM

## 2016-01-05 NOTE — Progress Notes (Signed)
Occupational Therapy Discharge Summary  Patient Details  Name: [name] MRN: [id number] Date of Birth: 07-08-1933  Today's Date: 01/05/2016 OT Individual Time: 1300-1400 OT Individual Time Calculation (min): 60 min  Patient has met 11 of 11 long term goals due to improved activity tolerance, improved balance, postural control, ability to compensate for deficits, functional use of  LEFT upper and LEFT lower extremity, improved awareness and improved coordination.  Patient to discharge at overall Modified Independent level.  Patient's care partner is independent to provide the necessary physical assistance at discharge for higher level IADL activities.   Reasons goals not met: n/a  Recommendation:  Patient will benefit from ongoing skilled OT services in home health setting to continue to advance functional skills in the area of BADL and iADL.  Equipment: tub-transfer bench- pt declined to obtain through hopital and plans to borrow a family members   Reasons for discharge: treatment goals met and discharge from hospital  Patient/family agrees with progress made and goals achieved: Yes Skilled OT Intervention OT session focused on dynamic standing balance strategies, bilateral UE coordination,  L NMR, fine-motor coordination. Pt ambulated to therapy gym w/ RW and Mod I. Kinetron used on dynamic level 12. Tactile and verbal cues for weight shifting and balance strategies. Hand-eye coordination, balance, UB/LB strengthening using graded catch activity using overhead pass and trampoline. Fine-motor coordination using graded buttons, threading activity, and string tying. Pt returnend to room at end of session and left semi-reclined in bed with needs met. Pt to dc at overall mod I level for ADL participation.   OT Discharge Precautions/Restrictions  Restrictions Weight Bearing Restrictions: No Pain  none/denies pain ADL ADL Eating: Modified independent Grooming: Modified  independent Where Assessed-Grooming: Standing at sink Upper Body Bathing: Modified independent Where Assessed-Upper Body Bathing: Shower Lower Body Bathing: Modified independent Where Assessed-Lower Body Bathing: Shower Upper Body Dressing: Modified independent (Device) Where Assessed-Upper Body Dressing: Edge of bed Lower Body Dressing: Modified independent Where Assessed-Lower Body Dressing: Edge of bed Toileting: Modified independent Where Assessed-Toileting: Glass blower/designer: Diplomatic Services operational officer Method: Clinical biochemist Method: Heritage manager: Radio broadcast assistant ADL Comments: Please see functional navigator Vision/Perception  Vision- Assessment Eye Alignment: Within Designer, television/film set Perception: Within Functional Limits  Cognition Overall Cognitive Status: Within Functional Limits for tasks assessed Arousal/Alertness: Awake/alert Orientation Level: Oriented X4 Safety/Judgment: Appears intact Sensation Sensation Light Touch: Impaired Detail Light Touch Impaired Details: Impaired LLE;Impaired LUE Stereognosis: Appears Intact Hot/Cold: Appears Intact Proprioception: Appears Intact Proprioception Impaired Details: Impaired LLE;Impaired LUE Coordination Gross Motor Movements are Fluid and Coordinated: No Fine Motor Movements are Fluid and Coordinated: No Finger Nose Finger Test: Improved  Balance Dynamic Standing Balance Dynamic Standing - Balance Support: During functional activity Dynamic Standing - Level of Assistance: 6: Modified independent (Device/Increase time) Dynamic Standing - Balance Activities: Reaching for objects;Forward lean/weight shifting Extremity/Trunk Assessment RUE Assessment RUE Assessment: Within Functional Limits LUE Assessment LUE Assessment: Within Functional Limits (improved strength/coordination, can use functionally)  See  Function Navigator for Current Functional Status.  Daneen Schick Erastus Bartolomei 01/05/2016, 4:03 PM

## 2016-01-06 LAB — CREATININE, SERUM
Creatinine, Ser: 0.72 mg/dL (ref 0.44–1.00)
GFR calc Af Amer: >60 mL/min (ref >60)
GFR calc non Af Amer: >60 mL/min (ref >60)

## 2016-01-06 MED ORDER — CALCIUM POLYCARBOPHIL 625 MG PO TABS: 625.0000 mg | ORAL_TABLET | Freq: Every day | ORAL | 0 refills | Status: DC

## 2016-01-06 MED ORDER — ATENOLOL 50 MG PO TABS: 50.0000 mg | ORAL_TABLET | Freq: Every day | ORAL | 1 refills | Status: DC

## 2016-01-06 MED ORDER — AMLODIPINE BESYLATE 10 MG PO TABS: 10.0000 mg | ORAL_TABLET | Freq: Every day | ORAL | 1 refills | Status: DC

## 2016-01-06 MED ORDER — ROSUVASTATIN CALCIUM 5 MG PO TABS: 5.0000 mg | ORAL_TABLET | Freq: Every day | ORAL | 1 refills | Status: DC

## 2016-01-06 MED ORDER — PANTOPRAZOLE SODIUM 40 MG PO TBEC: 40.0000 mg | DELAYED_RELEASE_TABLET | Freq: Every day | ORAL | 1 refills | Status: DC

## 2016-01-06 NOTE — Progress Notes (Signed)
Discharge instructions gvn by PA daughter at bedside, d/c home

## 2016-01-06 NOTE — Progress Notes (Signed)
Social Work  Discharge Note  The overall goal for the admission was met for:   Discharge location: Yes-HOME TO [place]  Length of Stay: Yes-8 DAYS  Discharge activity level: Yes-MOD/I LEVEL  Home/community participation: Yes  Services provided included: MD, RD, PT, OT, SLP, RN, CM, Pharmacy and SW  Financial Services: Medicare and Private Insurance: [place]  Follow-up services arranged: Home Health: [facility]-PT,OT,RN and Patient/Family has no preference for HH/DME agencies  Comments (or additional information):PT DID WELL AND REACHED MOD/I LEVEL GOALS AND IS READY FOR DC-PLANNING ON GOING TO [place] FEELS READY TO [place] BACK TO [place].  Patient/Family verbalized understanding of follow-up arrangements: Yes  Individual responsible for coordination of the follow-up plan: SELF & [facility]  Confirmed correct DME delivered: Elease Hashimoto 01/06/2016    Elease Hashimoto

## 2016-01-06 NOTE — Discharge Summary (Signed)
NAMKerby [name]:  [name], [name]                 ACCOUNT NO.:  [id number]  MEDICAL RECORD NO.:  [id number]  LOCATION:                                 FACILITY:  PHYSICIAN:  Maryla MorrowAnkit Patel, MD        DATE OF BIRTH:  [birth year]  DATE OF ADMISSION:  12/30/2015 DATE OF DISCHARGE:  01/06/2016                              DISCHARGE SUMMARY   DISCHARGE DIAGNOSES: 1. Right thalamic infarct. 2. Subcutaneous Lovenox for deep vein thrombosis prophylaxis. 3. Hypertension. 4. Hyponatremia. 5. Hyperlipidemia. 6. Insomnia.  HISTORY OF PRESENT ILLNESS:  This is an 82 year old right-handed female with history of hypertension, hyperlipidemia, who lives alone, independent prior to admission.  Recent admission for hyponatremia with dehydration presumed secondary to UTI.  Presented on December 28, 2015, with left-sided numbness, sodium 130, troponin negative.  CT and MRI showed acute right thalamic nonhemorrhagic infarct.  Moderate to severe chronic small vessel ischemic disease.  MRA with no emergent large vessel occlusion or stenosis.  Ultrasound of the abdomen showed cholelithiasis without evidence of cholecystitis.  The patient did not receive tPA.  Carotid Dopplers with no ICA stenosis.  Echocardiogram with ejection fraction of 65% grade 1 diastolic dysfunction.  Maintained on aspirin for CVA prophylaxis.  Subcutaneous Lovenox for DVT prophylaxis.  Tolerating a regular diet.  Bouts of loose stool with Clostridium difficile negative.  The patient was admitted for a comprehensive rehab program.  PAST MEDICAL HISTORY:  See discharge diagnoses.  SOCIAL HISTORY:  Lives alone, independent prior to admission.  FUNCTIONAL STATUS UPON ADMISSION TO REHAB SERVICES:  Min mod assist 100 feet with a rolling walker, moderate assist stand pivot transfers, min to mod assist activities of daily living.  PHYSICAL EXAMINATION:  VITAL SIGNS:  Blood pressure 98/76, pulse 72, temp 98, respirations 16. GENERAL:  This was an  alert female, oriented to person, place, and time. Well developed, well nourished. LUNGS:  Clear to auscultation without wheeze.  No respiratory distress. No wheezes. CARDIAC:  Regular rate and rhythm with normal heart sounds. ABDOMEN:  Soft, nontender.  Good bowel sounds.  No distention. EXTREMITIES:  Motor strength 4/5 left deltoid, biceps, triceps, and grip; 5/5 right deltoid, biceps.  REHABILITATION HOSPITAL COURSE:  The patient was admitted to inpatient rehab services with therapies initiated on a 3-hour daily basis, consisting of physical therapy, occupational therapy, and rehabilitation nursing.  The following issues were addressed during the patient's rehabilitation stay.  Pertaining to Ms. [name]'s right thalamic infarct remained stable maintained on aspirin therapy, she would follow up Neurology Services.  Subcutaneous Lovenox for DVT prophylaxis, no bleeding episodes.  Blood pressures monitored on Norvasc 10 mg daily, Tenormin 50 mg daily.  She would follow up with her primary MD, no orthostatic changes noted.  Bouts of hyponatremia remains improved with latest sodium of 136.  Bouts of loose stool, Clostridium difficile negative.  No nausea or vomiting.  Improved with the use of addition of fiber.  She remained on Crestor for hyperlipidemia.  The patient received weekly collaborative interdisciplinary team conferences to discuss estimated length of stay, family teaching, any barriers to discharge.  Ambulating 500 feet x2 without assistive device, working with dynamic standing  balance, forced use and coordination of left upper extremity.  Stair training up and down stairs with supervision, no loss of balance.  She could gather belongings for activities of daily living and homemaking and was advised no driving.  Home health therapies had been arranged.  Full teaching completed and discharge to home.  DISCHARGE MEDICATIONS: 1. Norvasc 10 mg p.o. daily. 2. Aspirin 325 mg p.o.  daily. 3. Atenolol 50 mg p.o. daily. 4. Xalatan ophthalmic solution 1 drop both eyes at bedtime. 5. Protonix 40 mg p.o. daily. 6. FiberCon 625 mg p.o. daily. 7. Crestor 5 mg p.o. daily. 8. Imodium as needed.  DIET:  Regular.  FOLLOWUP:  The patient would follow up with Dr. Maryla MorrowAnkit Patel at the Outpatient Rehab Service office as directed; Dr. Delia HeadyPramod Sethi, Neurology Services, 1 month call for appointment; [facility] for medical followup.     Mariam Dollaraniel Banessa Mao, P.A.   ______________________________ Maryla MorrowAnkit Patel, MD    DA/MEDQ  D:  01/05/2016  T:  01/06/2016  Job:  [id number]  cc:   Pramod P. Pearlean BrownieSethi, MD [facility]

## 2016-01-07 ENCOUNTER — Encounter (HOSPITAL_COMMUNITY): Payer: Medicare Other

## 2016-01-07 ENCOUNTER — Telehealth: Payer: Self-pay

## 2016-01-07 NOTE — Telephone Encounter (Signed)
First attempt TCC. Left message to have patient return our call

## 2016-01-13 ENCOUNTER — Encounter: Payer: Medicare Other | Attending: Physical Medicine & Rehabilitation | Admitting: Physical Medicine & Rehabilitation

## 2016-01-13 DIAGNOSIS — R269 Unspecified abnormalities of gait and mobility: Secondary | ICD-10-CM | POA: Insufficient documentation

## 2016-01-13 DIAGNOSIS — Z823 Family history of stroke: Secondary | ICD-10-CM | POA: Insufficient documentation

## 2016-01-13 DIAGNOSIS — Z96643 Presence of artificial hip joint, bilateral: Secondary | ICD-10-CM | POA: Insufficient documentation

## 2016-01-13 DIAGNOSIS — Z9841 Cataract extraction status, right eye: Secondary | ICD-10-CM | POA: Insufficient documentation

## 2016-01-13 DIAGNOSIS — Z8673 Personal history of transient ischemic attack (TIA), and cerebral infarction without residual deficits: Secondary | ICD-10-CM | POA: Insufficient documentation

## 2016-01-13 DIAGNOSIS — Z811 Family history of alcohol abuse and dependence: Secondary | ICD-10-CM | POA: Insufficient documentation

## 2016-01-13 DIAGNOSIS — I69398 Other sequelae of cerebral infarction: Secondary | ICD-10-CM | POA: Insufficient documentation

## 2016-01-13 DIAGNOSIS — I10 Essential (primary) hypertension: Secondary | ICD-10-CM | POA: Insufficient documentation

## 2016-01-13 DIAGNOSIS — K529 Noninfective gastroenteritis and colitis, unspecified: Secondary | ICD-10-CM | POA: Insufficient documentation

## 2016-01-13 DIAGNOSIS — Z8249 Family history of ischemic heart disease and other diseases of the circulatory system: Secondary | ICD-10-CM | POA: Insufficient documentation

## 2016-01-13 DIAGNOSIS — K219 Gastro-esophageal reflux disease without esophagitis: Secondary | ICD-10-CM | POA: Insufficient documentation

## 2016-01-13 DIAGNOSIS — R209 Unspecified disturbances of skin sensation: Secondary | ICD-10-CM | POA: Insufficient documentation

## 2016-01-13 DIAGNOSIS — I69354 Hemiplegia and hemiparesis following cerebral infarction affecting left non-dominant side: Secondary | ICD-10-CM | POA: Insufficient documentation

## 2016-01-13 DIAGNOSIS — E7800 Pure hypercholesterolemia, unspecified: Secondary | ICD-10-CM | POA: Insufficient documentation

## 2016-01-27 ENCOUNTER — Encounter (HOSPITAL_BASED_OUTPATIENT_CLINIC_OR_DEPARTMENT_OTHER): Payer: Medicare Other | Admitting: Physical Medicine & Rehabilitation

## 2016-01-27 ENCOUNTER — Encounter: Payer: Self-pay | Admitting: Physical Medicine & Rehabilitation

## 2016-01-27 VITALS — BP 147/67 | HR 72

## 2016-01-27 DIAGNOSIS — I10 Essential (primary) hypertension: Secondary | ICD-10-CM

## 2016-01-27 DIAGNOSIS — Z823 Family history of stroke: Secondary | ICD-10-CM | POA: Diagnosis not present

## 2016-01-27 DIAGNOSIS — I639 Cerebral infarction, unspecified: Secondary | ICD-10-CM | POA: Diagnosis not present

## 2016-01-27 DIAGNOSIS — Z9841 Cataract extraction status, right eye: Secondary | ICD-10-CM | POA: Diagnosis not present

## 2016-01-27 DIAGNOSIS — R269 Unspecified abnormalities of gait and mobility: Secondary | ICD-10-CM

## 2016-01-27 DIAGNOSIS — R209 Unspecified disturbances of skin sensation: Secondary | ICD-10-CM

## 2016-01-27 DIAGNOSIS — E7800 Pure hypercholesterolemia, unspecified: Secondary | ICD-10-CM | POA: Diagnosis not present

## 2016-01-27 DIAGNOSIS — I69398 Other sequelae of cerebral infarction: Secondary | ICD-10-CM | POA: Diagnosis not present

## 2016-01-27 DIAGNOSIS — I69354 Hemiplegia and hemiparesis following cerebral infarction affecting left non-dominant side: Secondary | ICD-10-CM | POA: Diagnosis present

## 2016-01-27 DIAGNOSIS — Z8249 Family history of ischemic heart disease and other diseases of the circulatory system: Secondary | ICD-10-CM | POA: Diagnosis not present

## 2016-01-27 DIAGNOSIS — K529 Noninfective gastroenteritis and colitis, unspecified: Secondary | ICD-10-CM | POA: Diagnosis not present

## 2016-01-27 DIAGNOSIS — Z8673 Personal history of transient ischemic attack (TIA), and cerebral infarction without residual deficits: Secondary | ICD-10-CM | POA: Diagnosis not present

## 2016-01-27 DIAGNOSIS — K219 Gastro-esophageal reflux disease without esophagitis: Secondary | ICD-10-CM | POA: Diagnosis not present

## 2016-01-27 DIAGNOSIS — Z96643 Presence of artificial hip joint, bilateral: Secondary | ICD-10-CM | POA: Diagnosis not present

## 2016-01-27 DIAGNOSIS — Z811 Family history of alcohol abuse and dependence: Secondary | ICD-10-CM | POA: Diagnosis not present

## 2016-01-27 NOTE — Progress Notes (Signed)
Subjective:    Patient ID: [name], female    DOB: 11/14/1933, 82 y.o.   MRN: [id number]  HPI 82 year old right-handed female with history of hypertension, hyperlipidemia presents for follow of right thalamic infarct.   DATE OF ADMISSION:  12/30/2015 DATE OF DISCHARGE:  01/06/2016  Since discharge, pt states that her BP has been in good control, even though it is elevated here today.  Her bowel movements have improved.  Her sleep has improved.  Pt has appointment to see Neurology in January. She states she seen PCP in January as well. She states she fell once at night.    DME: None needed Therapies: [facility] Mobility: Cane in unfamiliar settings.   Pain Inventory Average Pain 0 Pain Right Now 0 My pain is na  In the last 24 hours, has pain interfered with the following? General activity 0 Relation with others 0 Enjoyment of life 0 What TIME of day is your pain at its worst? nsa Sleep (in general) Good  Pain is worse with: na Pain improves with: na Relief from Meds: na  Mobility use a cane ability to climb steps?  yes do you drive?  yes  Function retired  Neuro/Psych numbness  Prior Studies Any changes since last visit?  no  Physicians involved in your care Any changes since last visit?  no   Family History  Problem Relation Age of Onset  . CAD Father   . Stroke Brother   . Alcohol abuse Brother   . CAD Brother    Social History   Social History  . Marital status: Divorced    Spouse name: N/A  . Number of children: N/A  . Years of education: N/A   Social History Main Topics  . Smoking status: Never Smoker  . Smokeless tobacco: Never Used  . Alcohol use No  . Drug use: No  . Sexual activity: No   Other Topics Concern  . None   Social History Narrative  . None   Past Surgical History:  Procedure Laterality Date  . CATARACT EXTRACTION W/ INTRAOCULAR LENS IMPLANT Right ~ 2011  . JOINT REPLACEMENT    . TOTAL HIP  ARTHROPLASTY  2007-2008   left-right   Past Medical History:  Diagnosis Date  . Acute ischemic stroke (HCC) 12/28/2015   [name] 12/28/2015  . Arthritis    "hands" (12/28/2015)  . GERD (gastroesophageal reflux disease)   . High cholesterol   . Hypertension   . PONV (postoperative nausea and vomiting)    "w/1st hip replacement"   BP (!) 147/67   Pulse 72   SpO2 98%   Opioid Risk Score:   Fall Risk Score:  `1  Depression screen PHQ 2/9  Depression screen PHQ 2/9 01/27/2016  Decreased Interest 0  Down, Depressed, Hopeless 0  PHQ - 2 Score 0  Altered sleeping 0  Tired, decreased energy 0  Change in appetite 0  Feeling bad or failure about yourself  0  Trouble concentrating 0  Moving slowly or fidgety/restless 0  Suicidal thoughts 0  PHQ-9 Score 0  Difficult doing work/chores Not difficult at all   Review of Systems  HENT: Negative.   Eyes: Negative.   Respiratory: Negative.   Cardiovascular: Negative.   Gastrointestinal: Negative.   Endocrine: Negative.   Genitourinary: Negative.   Musculoskeletal: Positive for gait problem.  Skin: Negative.   Allergic/Immunologic: Negative.   Neurological: Positive for weakness and numbness.  Hematological: Negative.   Psychiatric/Behavioral:  Negative.   All other systems reviewed and are negative.      Objective:   Physical Exam Constitutional: She appears well-developed and well-nourished. NAD. HENT: Normocephalic. Atraumatic Eyes: EOMI. No discharge. Cardiovascular: RRR. No JVD. Respiratory: Effort normal and breath sounds normal.   GI: Soft. Bowel sounds are normal.  Musculoskeletal: She exhibits no edema, no tendernes.. Neurological: She is alert and oriented.  Motor:  4+/5 in the left deltoid, biceps, triceps, grip. Dysmetria   RUE: 5/5.  LLE: 4/5 proximal to distal RLE: 4+/5 proximal to distal Sensation diminished to light touch LUE/LLE. Skin: Skin is warm and dry. Intact    Assessment & Plan:  82 year old  right-handed female with history of hypertension, hyperlipidemia presents for follow of right thalamic infarct.   1. [place] right thalamic infarct   Numbness and  mild left-sided weakness  Cont therapies  Follow up with Neurology  2. Hypertension  Cont meds  Cont follow and adjustments per PCP  3. Freq stools  Improving  Cont fiber supplements  4. Neurologic gait abnormality  Cont cane for safety

## 2016-02-29 ENCOUNTER — Ambulatory Visit (INDEPENDENT_AMBULATORY_CARE_PROVIDER_SITE_OTHER): Payer: Medicare Other | Admitting: Neurology

## 2016-02-29 ENCOUNTER — Encounter: Payer: Self-pay | Admitting: Neurology

## 2016-02-29 VITALS — BP 128/58 | HR 62 | Ht 59.0 in | Wt 113.0 lb

## 2016-02-29 DIAGNOSIS — I639 Cerebral infarction, unspecified: Secondary | ICD-10-CM

## 2016-02-29 DIAGNOSIS — I6381 Other cerebral infarction due to occlusion or stenosis of small artery: Secondary | ICD-10-CM

## 2016-02-29 NOTE — Patient Instructions (Signed)
I had a long d/w patient about her recent thalamic stroke,post stroke paresthesias, risk for recurrent stroke/TIAs, personally independently reviewed imaging studies and stroke evaluation results and answered questions.Continue aspirin 325 mg daily  for secondary stroke prevention and maintain strict control of hypertension with blood pressure goal below 130/90, diabetes with hemoglobin A1c goal below 6.5% and lipids with LDL cholesterol goal below 70 mg/dL. I also advised the patient to eat a healthy diet with plenty of whole grains, cereals, fruits and vegetables, exercise regularly and maintain ideal body weight .I counseled her that her post stroke paresthesias likely to improve over the next few months and she wants to hold off on specific medications for this at the present time. Followup in the future with my nurse practitioner in 6 months or call earlier if necessary  Stroke Prevention Some medical conditions and behaviors are associated with an increased chance of having a stroke. You may prevent a stroke by making healthy choices and managing medical conditions. How can I reduce my risk of having a stroke?  Stay physically active. Get at least 30 minutes of activity on most or all days.  Do not smoke. It may also be helpful to avoid exposure to secondhand smoke.  Limit alcohol use. Moderate alcohol use is considered to be:  No more than 2 drinks per day for men.  No more than 1 drink per day for nonpregnant women.  Eat healthy foods. This involves:  Eating 5 or more servings of fruits and vegetables a day.  Making dietary changes that address high blood pressure (hypertension), high cholesterol, diabetes, or obesity.  Manage your cholesterol levels.  Making food choices that are high in fiber and low in saturated fat, trans fat, and cholesterol may control cholesterol levels.  Take any prescribed medicines to control cholesterol as directed by your health care provider.  Manage  your diabetes.  Controlling your carbohydrate and sugar intake is recommended to manage diabetes.  Take any prescribed medicines to control diabetes as directed by your health care provider.  Control your hypertension.  Making food choices that are low in salt (sodium), saturated fat, trans fat, and cholesterol is recommended to manage hypertension.  Ask your health care provider if you need treatment to lower your blood pressure. Take any prescribed medicines to control hypertension as directed by your health care provider.  If you are 3618-81 years of age, have your blood pressure checked every 3-5 years. If you are 81 years of age or older, have your blood pressure checked every year.  Maintain a healthy weight.  Reducing calorie intake and making food choices that are low in sodium, saturated fat, trans fat, and cholesterol are recommended to manage weight.  Stop drug abuse.  Avoid taking birth control pills.  Talk to your health care provider about the risks of taking birth control pills if you are over 81 years old, smoke, get migraines, or have ever had a blood clot.  Get evaluated for sleep disorders (sleep apnea).  Talk to your health care provider about getting a sleep evaluation if you snore a lot or have excessive sleepiness.  Take medicines only as directed by your health care provider.  For some people, aspirin or blood thinners (anticoagulants) are helpful in reducing the risk of forming abnormal blood clots that can lead to stroke. If you have the irregular heart rhythm of atrial fibrillation, you should be on a blood thinner unless there is a good reason you cannot take them.  Understand all your medicine instructions.  Make sure that other conditions (such as anemia or atherosclerosis) are addressed. Get help right away if:  You have sudden weakness or numbness of the face, arm, or leg, especially on one side of the body.  Your face or eyelid droops to one  side.  You have sudden confusion.  You have trouble speaking (aphasia) or understanding.  You have sudden trouble seeing in one or both eyes.  You have sudden trouble walking.  You have dizziness.  You have a loss of balance or coordination.  You have a sudden, severe headache with no known cause.  You have new chest pain or an irregular heartbeat. Any of these symptoms may represent a serious problem that is an emergency. Do not wait to see if the symptoms will go away. Get medical help at once. Call your local emergency services (911 in [place] Do not drive yourself to the hospital.  This information is not intended to replace advice given to you by your health care provider. Make sure you discuss any questions you have with your health care provider. Document Released: 02/24/2004 Document Revised: 06/24/2015 Document Reviewed: 07/19/2012 Elsevier Interactive Patient Education  2017 [employer].

## 2016-02-29 NOTE — Progress Notes (Signed)
[facility] [address] [place] [id number] [phone]       OFFICE FOLLOW-UP NOTE  Ms. [name] Date of Birth:  11/21/1933 Medical Record Number:  [id number]   HPI: 83 year pleasant Caucasian lady seen today for first office follow-up visit for in-hospital admission for stroke in November 2017. History is obtained from the patient and review of hospital records. 83 year old woman who presented to emergency department on 12/28/15 with complaints of left-sided numbness. Symptoms started more than 24 hours ago but she initially thought they were due to some recent medications that were started. When this persisted, she presented for evaluation. In the ED, she was noted to have sensory loss over the left arm and leg with more subjective sensory changes over the left side of her face. She has been admitted for stroke evaluation. There has been some concern for possible functional element to portions of the exam.Patient was not administered IV t-PA secondary to minimal symptoms.  On exam she had some persistent left hemibody numbness. MRI scan showed a small right thalamic lacunar infarct. MRA of the brain did not show any large vessel stenosis and only mild intracranial metastatic changes. Carotid ultrasound showed only mild extra cranial stenosis. Transthoracic echo showed normal ejection fraction. LDL cholesterol elevated at 105 mg percent. Hemoglobin A1c was 5.1. Patient was seen by physical occupational therapy and felt to be a good candidate for inpatient rehabilitation. Patient was started on aspirin and continued on Crestor and Norvasc. She states she's done well since discharge his she is currently getting home physical and occupation therapy. She still has left hemibody numbness but the stent is better and tingling in the hands is also improved. She is starting aspirin well without bleeding or bruising. She states her blood pressure better controlled and today it  is 128/58 in office. She is fully independent and doing all activities of daily living. Patient claims a stroke to have episode of diarrhea and dehydration that she had one week prior to admission. ROS:   14 system review of systems is positive for leg swelling, feeling cold, numbness and all other systems negative  PMH:  Past Medical History:  Diagnosis Date  . Acute ischemic stroke (HCC) 12/28/2015   [name] 12/28/2015  . Arthritis    "hands" (12/28/2015)  . GERD (gastroesophageal reflux disease)   . High cholesterol   . Hypertension   . PONV (postoperative nausea and vomiting)    "w/1st hip replacement"    Social History:  Social History   Social History  . Marital status: Divorced    Spouse name: N/A  . Number of children: N/A  . Years of education: N/A   Occupational History  . Not on file.   Social History Main Topics  . Smoking status: Never Smoker  . Smokeless tobacco: Never Used  . Alcohol use No  . Drug use: No  . Sexual activity: No   Other Topics Concern  . Not on file   Social History Narrative  . No narrative on file    Medications:   Current Outpatient Prescriptions on File Prior to Visit  Medication Sig Dispense Refill  . amLODipine (NORVASC) 10 MG tablet Take 1 tablet (10 mg total) by mouth daily. 30 tablet 1  . aspirin 325 MG tablet Take 1 tablet (325 mg total) by mouth daily.    Marland Kitchen. atenolol (TENORMIN) 50 MG tablet Take 1 tablet (50 mg total) by mouth daily. 30 tablet 1  .  latanoprost (XALATAN) 0.005 % ophthalmic solution Place 1 drop into both eyes at bedtime.    . pantoprazole (PROTONIX) 40 MG tablet Take 1 tablet (40 mg total) by mouth daily. 30 tablet 1  . rosuvastatin (CRESTOR) 5 MG tablet Take 1 tablet (5 mg total) by mouth daily at 6 PM. 30 tablet 1   No current facility-administered medications on file prior to visit.     Allergies:   Allergies  Allergen Reactions  . Ativan [Lorazepam]     altered  . Glucosamine Sulfate Other (See  Comments)  . Niacin Other (See Comments)  . Phenergan [Promethazine]     "Seeing things"    . Amoxicillin Rash    Physical Exam General: Frail elderly pleasant Caucasian lady, seated, in no evident distress Head: head normocephalic and atraumatic.  Neck: supple with no carotid or supraclavicular bruits Cardiovascular: regular rate and rhythm, no murmurs Musculoskeletal: no deformity Skin:  no rash/petichiae Vascular:  Normal pulses all extremities Vitals:   02/29/16 1437  BP: (!) 128/58  Pulse: 62   Neurologic Exam Mental Status: Awake and fully alert. Oriented to place and time. Recent and remote memory intact. Attention span, concentration and fund of knowledge appropriate. Mood and affect appropriate.  Cranial Nerves: Fundoscopic exam reveals sharp disc margins. Pupils equal, briskly reactive to light. Extraocular movements full without nystagmus. Visual fields full to confrontation. Hearing intact. Facial sensation intact. Face, tongue, palate moves normally and symmetrically.  Motor: Normal bulk and tone. Normal strength in all tested extremity muscles. Sensory.:Diminished left hemibody touch ,pinprick .position and vibratory sensation.  Coordination: Rapid alternating movements normal in all extremities. Finger-to-nose and heel-to-shin performed accurately bilaterally. Gait and Station: Arises from chair without difficulty. Stance is normal. Gait demonstrates normal stride length and balance . Able to heel, toe and tandem walk with  difficulty.  Reflexes: 1+ and symmetric. Toes downgoing.   NIHSS  1 Modified Rankin 2   ASSESSMENT: 83 year Caucasian lady with right thalamic lacunar infarct in November 2017 from small vessel disease. Doing very well. Vascular risk factors of hypertension, hyperlipidemia and age.    PLAN: I had a long d/w patient about her recent thalamic stroke,post stroke paresthesias, risk for recurrent stroke/TIAs, personally independently reviewed  imaging studies and stroke evaluation results and answered questions.Continue aspirin 325 mg daily  for secondary stroke prevention and maintain strict control of hypertension with blood pressure goal below 130/90, diabetes with hemoglobin A1c goal below 6.5% and lipids with LDL cholesterol goal below 70 mg/dL. I also advised the patient to eat a healthy diet with plenty of whole grains, cereals, fruits and vegetables, exercise regularly and maintain ideal body weight .I counseled her that her post stroke paresthesias likely to improve over the next few months and she wants to hold off on specific medications for this at the present time. Followup in the future with my nurse practitioner in 6 months or call earlier if necessary Greater than 50% of time during this 25 minute visit was spent on counseling,explanation of diagnosis, planning of further management, discussion with patient and family and coordination of care Delia Heady, MD  [facility] [address] [place], [place] [address]  Phone [phone] Fax [phone] Note: This document was prepared with digital dictation and possible smart phrase technology. Any transcriptional errors that result from this process are unintentional

## 2016-03-01 ENCOUNTER — Other Ambulatory Visit: Payer: Self-pay

## 2016-04-06 ENCOUNTER — Encounter: Payer: Medicare Other | Attending: Physical Medicine & Rehabilitation | Admitting: Physical Medicine & Rehabilitation

## 2016-04-06 ENCOUNTER — Encounter: Payer: Self-pay | Admitting: Physical Medicine & Rehabilitation

## 2016-04-06 VITALS — BP 136/81 | HR 60 | Resp 14

## 2016-04-06 DIAGNOSIS — I639 Cerebral infarction, unspecified: Secondary | ICD-10-CM | POA: Diagnosis not present

## 2016-04-06 DIAGNOSIS — K529 Noninfective gastroenteritis and colitis, unspecified: Secondary | ICD-10-CM | POA: Insufficient documentation

## 2016-04-06 DIAGNOSIS — I69354 Hemiplegia and hemiparesis following cerebral infarction affecting left non-dominant side: Secondary | ICD-10-CM | POA: Insufficient documentation

## 2016-04-06 DIAGNOSIS — I10 Essential (primary) hypertension: Secondary | ICD-10-CM | POA: Insufficient documentation

## 2016-04-06 DIAGNOSIS — R269 Unspecified abnormalities of gait and mobility: Secondary | ICD-10-CM | POA: Diagnosis not present

## 2016-04-06 DIAGNOSIS — Z8673 Personal history of transient ischemic attack (TIA), and cerebral infarction without residual deficits: Secondary | ICD-10-CM | POA: Diagnosis not present

## 2016-04-06 DIAGNOSIS — K219 Gastro-esophageal reflux disease without esophagitis: Secondary | ICD-10-CM | POA: Diagnosis not present

## 2016-04-06 DIAGNOSIS — Z96643 Presence of artificial hip joint, bilateral: Secondary | ICD-10-CM | POA: Insufficient documentation

## 2016-04-06 DIAGNOSIS — E7800 Pure hypercholesterolemia, unspecified: Secondary | ICD-10-CM | POA: Diagnosis not present

## 2016-04-06 DIAGNOSIS — Z9841 Cataract extraction status, right eye: Secondary | ICD-10-CM | POA: Diagnosis not present

## 2016-04-06 DIAGNOSIS — Z8249 Family history of ischemic heart disease and other diseases of the circulatory system: Secondary | ICD-10-CM | POA: Diagnosis not present

## 2016-04-06 DIAGNOSIS — Z811 Family history of alcohol abuse and dependence: Secondary | ICD-10-CM | POA: Insufficient documentation

## 2016-04-06 DIAGNOSIS — I69398 Other sequelae of cerebral infarction: Secondary | ICD-10-CM | POA: Diagnosis not present

## 2016-04-06 DIAGNOSIS — R209 Unspecified disturbances of skin sensation: Secondary | ICD-10-CM | POA: Insufficient documentation

## 2016-04-06 DIAGNOSIS — Z823 Family history of stroke: Secondary | ICD-10-CM | POA: Diagnosis not present

## 2016-04-06 NOTE — Progress Notes (Addendum)
Subjective:    Patient ID: [name], female    DOB: August 16, 1933, 83 y.o.   MRN: [id number]  HPI 83 year old right-handed female with history of hypertension, hyperlipidemia presents for follow of right thalamic infarct.   Last clinic visit 01/27/16. Since that time, she saw Neurology.  Pt states she feels like she is doing well, but sometimes her "head doesn't feel right".  Her numbness is persistent, but weakness has improved.  She saw her PCP, but maintained her BP meds.  Her bowels are normal again.  She no longer requires a cane.  She completed therapies. Denies falls.  Pain Inventory Average Pain 0 Pain Right Now 0 My pain is na  In the last 24 hours, has pain interfered with the following? General activity 0 Relation with others 0 Enjoyment of life 0 What TIME of day is your pain at its worst? nsa Sleep (in general) Good  Pain is worse with: na Pain improves with: na Relief from Meds: na  Mobility do you drive?  yes  Function retired  Neuro/Psych No problems in this area  Prior Studies Any changes since last visit?  no  Physicians involved in your care Any changes since last visit?  no   Family History  Problem Relation Age of Onset  . CAD Father   . Stroke Father   . Alcohol abuse Brother   . CAD Brother    Social History   Social History  . Marital status: Divorced    Spouse name: N/A  . Number of children: N/A  . Years of education: N/A   Social History Main Topics  . Smoking status: Never Smoker  . Smokeless tobacco: Never Used  . Alcohol use No  . Drug use: No  . Sexual activity: No   Other Topics Concern  . None   Social History Narrative  . None   Past Surgical History:  Procedure Laterality Date  . CATARACT EXTRACTION W/ INTRAOCULAR LENS IMPLANT Right ~ 2011  . JOINT REPLACEMENT    . TOTAL HIP ARTHROPLASTY  2007-2008   left-right   Past Medical History:  Diagnosis Date  . Acute ischemic stroke (HCC) 12/28/2015   [name]  12/28/2015  . Arthritis    "hands" (12/28/2015)  . GERD (gastroesophageal reflux disease)   . High cholesterol   . Hypertension   . PONV (postoperative nausea and vomiting)    "w/1st hip replacement"   BP 136/81   Pulse 60   Resp 14   SpO2 96%   Opioid Risk Score:   Fall Risk Score:  `1  Depression screen PHQ 2/9  Depression screen [facility] 2/9 04/06/2016 01/27/2016  Decreased Interest 0 0  Down, Depressed, Hopeless 0 0  PHQ - 2 Score 0 0  Altered sleeping - 0  Tired, decreased energy - 0  Change in appetite - 0  Feeling bad or failure about yourself  - 0  Trouble concentrating - 0  Moving slowly or fidgety/restless - 0  Suicidal thoughts - 0  PHQ-9 Score - 0  Difficult doing work/chores - Not difficult at all   Review of Systems  HENT: Negative.   Eyes: Negative.   Respiratory: Negative.   Cardiovascular: Negative.   Gastrointestinal: Negative.   Endocrine: Negative.   Genitourinary: Negative.   Musculoskeletal: Negative.   Skin: Negative.   Allergic/Immunologic: Negative.   Neurological: Negative.        Head still feels a little funny sometimes  Hematological: Negative.   Psychiatric/Behavioral:  Negative.   All other systems reviewed and are negative.      Objective:   Physical Exam Constitutional: She appears well-developed and well-nourished. NAD. HENT: Normocephalic. Atraumatic Eyes: EOMI. No discharge. Cardiovascular: RRR. No JVD. Respiratory: Effort normal and breath sounds normal.   GI: Soft. Bowel sounds are normal.  Musculoskeletal: She exhibits no edema, no tendernes.. Neurological: She is alert and oriented.  Motor:  4+/5 in the left deltoid, biceps, triceps, grip.  RUE: 5/5.  LLE: 4+/5 proximal to distal RLE: 5/5 proximal to distal Sensation diminished to light touch LUE/LLE. Skin: Skin is warm and dry. Intact    Assessment & Plan:  83 year old right-handed female with history of hypertension, hyperlipidemia presents for follow of right  thalamic infarct.   1. Sequela right thalamic infarct   Numbness and mild left-sided weakness, numbness more of an annoyance  Completed therapies, cont HEP  Cont follow up with Neurology  Does not want medications at present  2. Hypertension  Cont meds  Cont follow and adjustments per PCP  3. Freq stools  Resolved  4. Neurologic gait abnormality  No longer requires cane  Encouraged slower pace

## 2016-07-07 ENCOUNTER — Ambulatory Visit: Payer: Medicare Other | Admitting: Physical Medicine & Rehabilitation

## 2016-08-28 ENCOUNTER — Encounter: Payer: Self-pay | Admitting: Nurse Practitioner

## 2016-08-28 ENCOUNTER — Ambulatory Visit (INDEPENDENT_AMBULATORY_CARE_PROVIDER_SITE_OTHER): Payer: Medicare Other | Admitting: Nurse Practitioner

## 2016-08-28 VITALS — BP 140/62 | HR 61 | Wt 114.4 lb

## 2016-08-28 DIAGNOSIS — I10 Essential (primary) hypertension: Secondary | ICD-10-CM | POA: Diagnosis not present

## 2016-08-28 DIAGNOSIS — I639 Cerebral infarction, unspecified: Secondary | ICD-10-CM

## 2016-08-28 DIAGNOSIS — E782 Mixed hyperlipidemia: Secondary | ICD-10-CM | POA: Insufficient documentation

## 2016-08-28 DIAGNOSIS — I63331 Cerebral infarction due to thrombosis of right posterior cerebral artery: Secondary | ICD-10-CM | POA: Diagnosis not present

## 2016-08-28 DIAGNOSIS — E785 Hyperlipidemia, unspecified: Secondary | ICD-10-CM

## 2016-08-28 NOTE — Patient Instructions (Addendum)
Stressed the importance of management of risk factors to prevent further stroke Continue aspirinfor secondary stroke prevention Maintain strict control of hypertension with blood pressure goal below 130/90, today's reading 140/62 continue antihypertensive medications Cholesterol with LDL cholesterol less than 70, followed by primary care,  continue statin drugs Crestor  Exercise by walking, slowly increase , HEP eat healthy diet with whole grains,  fresh fruits and vegetables Follow up 6 months

## 2016-08-28 NOTE — Progress Notes (Signed)
[facility]  PATIENT: [name] DOB: 06/06/1933   REASON FOR VISIT: Follow-up for lacunar infarct Nov 2017 HISTORY FROM: Patient    HISTORY OF PRESENT ILLNESS:UPDATE 07/30/2018CM Ms. [name], 83 year old female returns for follow-up with history of lacunar infarct in 2017 November. She is currently on aspirin for secondary stroke prevention without recurrent stroke or TIA symptoms. She has minimal bruising and no bleeding. She is also on Crestor without myalgias. Blood pressure in the office today 140/62. Her biggest complaint she can't go like she did prior to her stroke but she remains active. She drives a car. She just had labs that her primary care office. She does not have results. She returns for reevaluation She continues to have some persistent left hemibody numbness. History 02/29/16 PS82 year pleasant Caucasian lady seen today for first office follow-up visit for in-hospital admission for stroke in November 2017. History is obtained from the patient and review of hospital records. 83 year old woman who presented to emergency department on 12/28/15 with complaints of left-sided numbness. Symptoms started more than 24 hours ago but she initially thought they were due to some recent medications that were started. When this persisted, she presented for evaluation. In the ED, she was noted to have sensory loss over the left arm and leg with more subjective sensory changes over the left side of her face. She has been admitted for stroke evaluation. There has been some concern for possible functional element to portions of the exam.Patient was not administered IV t-PA secondary to minimal symptoms.  On exam she had some persistent left hemibody numbness. MRI scan showed a small right thalamic lacunar infarct. MRA of the brain did not show any large vessel stenosis and only mild intracranial metastatic changes. Carotid ultrasound showed only mild extra cranial stenosis.  Transthoracic echo showed normal ejection fraction. LDL cholesterol elevated at 105 mg percent. Hemoglobin A1c was 5.1. Patient was seen by physical occupational therapy and felt to be a good candidate for inpatient rehabilitation. Patient was started on aspirin and continued on Crestor and Norvasc. She states she's done well since discharge his she is currently getting home physical and occupation therapy. She still has left hemibody numbness but the stent is better and tingling in the hands is also improved. She is starting aspirin well without bleeding or bruising. She states her blood pressure better controlled and today it is 128/58 in office. She is fully independent and doing all activities of daily living. Patient claims a stroke to have episode of diarrhea and dehydration that she had one week prior to admission.   REVIEW OF SYSTEMS: Full 14 system review of systems performed and notable only for those listed, all others are neg:  Constitutional: Fatigue  Cardiovascular: neg Ear/Nose/Throat: neg  Skin: neg Eyes: neg Respiratory: neg Gastroitestinal: neg  Hematology/Lymphatic: neg  Endocrine: neg Musculoskeletal:neg Allergy/Immunology: neg Neurological: Numbness Psychiatric: neg Sleep : neg   ALLERGIES: Allergies  Allergen Reactions  . Ativan [Lorazepam]     altered  . Glucosamine Sulfate Other (See Comments)  . Niacin Other (See Comments)  . Phenergan [Promethazine]     "Seeing things"    . Amoxicillin Rash    HOME MEDICATIONS: Outpatient Medications Prior to Visit  Medication Sig Dispense Refill  . amLODipine (NORVASC) 10 MG tablet Take 1 tablet (10 mg total) by mouth daily. 30 tablet 1  . aspirin 325 MG tablet Take 1 tablet (325 mg total) by mouth daily.    Marland Kitchen. atenolol (TENORMIN) 50  MG tablet Take 1 tablet (50 mg total) by mouth daily. 30 tablet 1  . latanoprost (XALATAN) 0.005 % ophthalmic solution Place 1 drop into both eyes at bedtime.    . Multiple Vitamin  (MULTI-VITAMINS) TABS Take by mouth.    Marland Kitchen. omeprazole (PRILOSEC) 40 MG capsule     . ondansetron (ZOFRAN) 4 MG tablet Take 4 mg by mouth.    . pantoprazole (PROTONIX) 40 MG tablet Take 1 tablet (40 mg total) by mouth daily. 30 tablet 1  . rosuvastatin (CRESTOR) 5 MG tablet Take 1 tablet (5 mg total) by mouth daily at 6 PM. 30 tablet 1   No facility-administered medications prior to visit.     PAST MEDICAL HISTORY: Past Medical History:  Diagnosis Date  . Acute ischemic stroke (HCC) 12/28/2015   [name]/notes 12/28/2015  . Arthritis    "hands" (12/28/2015)  . GERD (gastroesophageal reflux disease)   . High cholesterol   . Hypertension   . PONV (postoperative nausea and vomiting)    "w/1st hip replacement"    PAST SURGICAL HISTORY: Past Surgical History:  Procedure Laterality Date  . CATARACT EXTRACTION W/ INTRAOCULAR LENS IMPLANT Right ~ 2011  . JOINT REPLACEMENT    . TOTAL HIP ARTHROPLASTY  2007-2008   left-right    FAMILY HISTORY: Family History  Problem Relation Age of Onset  . CAD Father   . Stroke Father   . Alcohol abuse Brother   . CAD Brother   . Aneurysm Brother     SOCIAL HISTORY: Social History   Social History  . Marital status: Divorced    Spouse name: N/A  . Number of children: N/A  . Years of education: N/A   Occupational History  . Not on file.   Social History Main Topics  . Smoking status: Never Smoker  . Smokeless tobacco: Never Used  . Alcohol use No  . Drug use: No  . Sexual activity: No   Other Topics Concern  . Not on file   Social History Narrative  . No narrative on file     PHYSICAL EXAM  Vitals:   08/28/16 1339  BP: 140/62  Pulse: 61  Weight: 114 lb 6.4 oz (51.9 kg)   Body mass index is 23.11 kg/m.  Generalized: Well developed, in no acute distress  Head: normocephalic and atraumatic,. Oropharynx benign  Neck: Supple, no carotid bruits  Cardiac: Regular rate rhythm, no murmur  Musculoskeletal: No deformity    Neurological examination   Mentation: Alert oriented to time, place, history taking. Attention span and concentration appropriate. Recent and remote memory intact.  Follows all commands speech and language fluent.   Cranial nerve II-XII: Fundoscopic exam not done.Pupils were equal round reactive to light extraocular movements were full, visual field were full on confrontational test. Facial sensation and strength were normal. hearing was intact to finger rubbing bilaterally. Uvula tongue midline. head turning and shoulder shrug were normal and symmetric.Tongue protrusion into cheek strength was normal. Motor: normal bulk and tone, full strength in the BUE, BLE, No focal weakness Sensory: Diminished left hemibody touch pinprick and vibratory sensation, normal on the right   Coordination: finger-nose-finger, heel-to-shin bilaterally, no dysmetria Reflexes: 1+ upper lower and symmetric plantar responses were flexor bilaterally. Gait and Station: Rising up from seated position without assistance, normal stance,  moderate stride, good arm swing, smooth turning, able to perform tiptoe, and heel walking without difficulty. Tandem gait is steady. No assistive device  DIAGNOSTIC DATA (LABS, IMAGING, TESTING) - I  reviewed patient records, labs, notes, testing and imaging myself where available.  Lab Results  Component Value Date   WBC 6.6 01/04/2016   HGB 12.9 01/04/2016   HCT 37.7 01/04/2016   MCV 88.7 01/04/2016   PLT 231 01/04/2016      Component Value Date/Time   NA 138 01/04/2016 1047   K 4.1 01/04/2016 1047   CL 101 01/04/2016 1047   CO2 30 01/04/2016 1047   GLUCOSE 117 (H) 01/04/2016 1047   BUN 14 01/04/2016 1047   CREATININE 0.72 01/06/2016 0443   CALCIUM 9.6 01/04/2016 1047   PROT 5.4 (L) 12/31/2015 0512   ALBUMIN 3.3 (L) 12/31/2015 0512   AST 22 12/31/2015 0512   ALT 14 12/31/2015 0512   ALKPHOS 50 12/31/2015 0512   BILITOT 0.7 12/31/2015 0512   GFRNONAA >60 01/06/2016 0443    GFRAA >60 01/06/2016 0443   Lab Results  Component Value Date   CHOL 175 12/29/2015   HDL 36 (L) 12/29/2015   LDLCALC 105 (H) 12/29/2015   TRIG 171 (H) 12/29/2015   CHOLHDL 4.9 12/29/2015   Lab Results  Component Value Date   HGBA1C 5.1 12/29/2015   No results found for: [id number] Lab Results  Component Value Date   TSH 2.824 12/24/2015      ASSESSMENT AND PLAN 40 year Caucasian lady with right thalamic lacunar infarct in November 2017 from small vessel disease. Doing very well. Vascular risk factors of hypertension, hyperlipidemia and age.Continues to have persistent left hemibody numbness.  Stressed the importance of management of risk factors to prevent further stroke Continue aspirin for secondary stroke prevention Maintain strict control of hypertension with blood pressure goal below 130/90, today's reading 140/62 continue antihypertensive medications Cholesterol with LDL cholesterol less than 70, followed by primary care,  continue statin drugs Crestor  Exercise by walking, slowly increase ,  eat healthy diet with whole grains,  fresh fruits and vegetables Follow up 6 months if stable will discharge I spent 25 min in total face to face time with the patient more than 50% of which was spent counseling and coordination of care, reviewing test results reviewing medications and discussing and reviewing the diagnosis of stroke and management of risk factors. Importance of exercise on overall health and well-being Nilda Riggs, [facility], [facility], APRN [facility] [address] [place], [address]

## 2016-08-29 NOTE — Progress Notes (Signed)
I agree with the above plan 

## 2016-09-29 ENCOUNTER — Ambulatory Visit (HOSPITAL_COMMUNITY)
Admission: EM | Admit: 2016-09-29 | Discharge: 2016-09-29 | Disposition: A | Discharge status: Home / Self Care | Payer: Medicare Other | Attending: Emergency Medicine | Admitting: Emergency Medicine

## 2016-09-29 ENCOUNTER — Emergency Department (HOSPITAL_COMMUNITY): Payer: Medicare Other

## 2016-09-29 ENCOUNTER — Emergency Department (HOSPITAL_COMMUNITY): Payer: Medicare Other | Admitting: Anesthesiology

## 2016-09-29 ENCOUNTER — Ambulatory Visit (HOSPITAL_COMMUNITY): Payer: Medicare Other

## 2016-09-29 ENCOUNTER — Encounter (HOSPITAL_COMMUNITY): Payer: Self-pay

## 2016-09-29 ENCOUNTER — Encounter (HOSPITAL_COMMUNITY)
Admission: EM | Disposition: A | Discharge status: Home / Self Care | Payer: Self-pay | Source: Home / Self Care | Attending: Emergency Medicine

## 2016-09-29 DIAGNOSIS — Z96649 Presence of unspecified artificial hip joint: Secondary | ICD-10-CM

## 2016-09-29 DIAGNOSIS — Z880 Allergy status to penicillin: Secondary | ICD-10-CM | POA: Insufficient documentation

## 2016-09-29 DIAGNOSIS — E7800 Pure hypercholesterolemia, unspecified: Secondary | ICD-10-CM | POA: Diagnosis not present

## 2016-09-29 DIAGNOSIS — K219 Gastro-esophageal reflux disease without esophagitis: Secondary | ICD-10-CM | POA: Diagnosis not present

## 2016-09-29 DIAGNOSIS — Z419 Encounter for procedure for purposes other than remedying health state, unspecified: Secondary | ICD-10-CM

## 2016-09-29 DIAGNOSIS — M1990 Unspecified osteoarthritis, unspecified site: Secondary | ICD-10-CM | POA: Insufficient documentation

## 2016-09-29 DIAGNOSIS — Z79899 Other long term (current) drug therapy: Secondary | ICD-10-CM | POA: Insufficient documentation

## 2016-09-29 DIAGNOSIS — I10 Essential (primary) hypertension: Secondary | ICD-10-CM | POA: Diagnosis not present

## 2016-09-29 DIAGNOSIS — S73004A Unspecified dislocation of right hip, initial encounter: Secondary | ICD-10-CM | POA: Insufficient documentation

## 2016-09-29 DIAGNOSIS — T84029A Dislocation of unspecified internal joint prosthesis, initial encounter: Secondary | ICD-10-CM

## 2016-09-29 HISTORY — PX: HIP CLOSED REDUCTION: SHX983

## 2016-09-29 SURGERY — CLOSED REDUCTION, HIP
Anesthesia: Monitor Anesthesia Care | Site: Hip | Laterality: Right

## 2016-09-29 MED ORDER — MEPERIDINE HCL 25 MG/ML IJ SOLN: 6.2500 mg | INTRAMUSCULAR | Status: DC | PRN

## 2016-09-29 MED ORDER — DEXTROSE 5 % IV SOLN: 500.0000 mg | Freq: Four times a day (QID) | INTRAVENOUS | Status: DC | PRN

## 2016-09-29 MED ORDER — HYDROMORPHONE HCL 1 MG/ML IJ SOLN: 0.5000 mg | INTRAMUSCULAR | Status: DC | PRN

## 2016-09-29 MED ORDER — PROPOFOL 10 MG/ML IV BOLUS
INTRAVENOUS | Status: DC | PRN
  Administered 2016-09-29 (×2): 50 mg via INTRAVENOUS

## 2016-09-29 MED ORDER — OXYCODONE HCL 5 MG PO TABS: 5.0000 mg | ORAL_TABLET | ORAL | Status: DC | PRN

## 2016-09-29 MED ORDER — FENTANYL CITRATE (PF) 100 MCG/2ML IJ SOLN: 25.0000 ug | INTRAMUSCULAR | Status: DC | PRN

## 2016-09-29 MED ORDER — ONDANSETRON HCL 4 MG/2ML IJ SOLN
INTRAMUSCULAR | Status: DC | PRN
  Administered 2016-09-29: 4 mg via INTRAVENOUS

## 2016-09-29 MED ORDER — METOCLOPRAMIDE HCL 5 MG/ML IJ SOLN: 10.0000 mg | Freq: Once | INTRAMUSCULAR | Status: DC | PRN

## 2016-09-29 MED ORDER — METOCLOPRAMIDE HCL 5 MG PO TABS: 5.0000 mg | ORAL_TABLET | Freq: Three times a day (TID) | ORAL | Status: DC | PRN

## 2016-09-29 MED ORDER — FENTANYL CITRATE (PF) 250 MCG/5ML IJ SOLN
INTRAMUSCULAR | Status: AC
  Filled 2016-09-29: qty 5

## 2016-09-29 MED ORDER — PROPOFOL 10 MG/ML IV BOLUS
0.5000 mg/kg | Freq: Once | INTRAVENOUS | Status: DC
  Filled 2016-09-29 (×2): qty 20

## 2016-09-29 MED ORDER — ONDANSETRON HCL 4 MG PO TABS: 4.0000 mg | ORAL_TABLET | Freq: Four times a day (QID) | ORAL | Status: DC | PRN

## 2016-09-29 MED ORDER — LACTATED RINGERS IV SOLN
INTRAVENOUS | Status: DC | PRN
  Administered 2016-09-29: 10:00:00 via INTRAVENOUS

## 2016-09-29 MED ORDER — ONDANSETRON HCL 4 MG/2ML IJ SOLN
INTRAMUSCULAR | Status: AC
  Filled 2016-09-29: qty 2

## 2016-09-29 MED ORDER — MORPHINE SULFATE (PF) 4 MG/ML IV SOLN
2.0000 mg | Freq: Once | INTRAVENOUS | Status: AC
  Administered 2016-09-29: 2 mg via INTRAVENOUS
  Filled 2016-09-29: qty 1

## 2016-09-29 MED ORDER — METHOCARBAMOL 500 MG PO TABS: 500.0000 mg | ORAL_TABLET | Freq: Four times a day (QID) | ORAL | Status: DC | PRN

## 2016-09-29 MED ORDER — METOCLOPRAMIDE HCL 5 MG/ML IJ SOLN: 5.0000 mg | Freq: Three times a day (TID) | INTRAMUSCULAR | Status: DC | PRN

## 2016-09-29 MED ORDER — PROPOFOL 10 MG/ML IV BOLUS
INTRAVENOUS | Status: DC | PRN
  Administered 2016-09-29 (×4): 20 mg via INTRAVENOUS

## 2016-09-29 MED ORDER — ONDANSETRON HCL 4 MG/2ML IJ SOLN: 4.0000 mg | Freq: Four times a day (QID) | INTRAMUSCULAR | Status: DC | PRN

## 2016-09-29 MED ORDER — PROPOFOL 10 MG/ML IV BOLUS
INTRAVENOUS | Status: AC
  Filled 2016-09-29: qty 40

## 2016-09-29 MED ORDER — ONDANSETRON HCL 4 MG/2ML IJ SOLN
4.0000 mg | Freq: Once | INTRAMUSCULAR | Status: AC
  Administered 2016-09-29: 4 mg via INTRAVENOUS
  Filled 2016-09-29: qty 2

## 2016-09-29 MED ORDER — SUCCINYLCHOLINE CHLORIDE 200 MG/10ML IV SOSY
PREFILLED_SYRINGE | INTRAVENOUS | Status: AC
  Filled 2016-09-29: qty 10

## 2016-09-29 MED ORDER — LACTATED RINGERS IV SOLN
INTRAVENOUS | Status: DC
  Administered 2016-09-29: 10:00:00 via INTRAVENOUS

## 2016-09-29 MED ORDER — FENTANYL CITRATE (PF) 250 MCG/5ML IJ SOLN
INTRAMUSCULAR | Status: DC | PRN
  Administered 2016-09-29: 25 ug via INTRAVENOUS

## 2016-09-29 MED ORDER — GLYCOPYRROLATE 0.2 MG/ML IJ SOLN
INTRAMUSCULAR | Status: DC | PRN
  Administered 2016-09-29: 0.2 mg via INTRAVENOUS

## 2016-09-29 MED ORDER — HYDROCODONE-ACETAMINOPHEN 5-325 MG PO TABS: 1.0000 | ORAL_TABLET | ORAL | Status: DC | PRN

## 2016-09-29 SURGICAL SUPPLY — 1 items: IMMOBILIZER KNEE 22 (SOFTGOODS) ×3 IMPLANT

## 2016-09-29 NOTE — ED Notes (Signed)
Patient transported to X-ray 

## 2016-09-29 NOTE — Brief Op Note (Signed)
09/29/2016  10:09 AM  PATIENT:  [name]  83 y.o. female  PRE-OPERATIVE DIAGNOSIS:  dislocated right total hip  POST-OPERATIVE DIAGNOSIS:  dislocated right total hip  PROCEDURE:  Procedure(s): CLOSED REDUCTION RIGHT HIP (Right)  SURGEON:  Surgeon(s) and Role:    * Lacey Wallman, Arlys JohnBrian, MD - Primary  PHYSICIAN ASSISTANT: none  ASSISTANTS: staff   ANESTHESIA:   IV sedation  EBL:  No intake/output data recorded.  BLOOD ADMINISTERED:none  DRAINS: none   LOCAL MEDICATIONS USED:  NONE  SPECIMEN:  No Specimen  DISPOSITION OF SPECIMEN:  N/A  COUNTS:  NO closed procedure  TOURNIQUET:  * No tourniquets in log *  DICTATION: .Other Dictation: Dictation Number [id number]  PLAN OF CARE: Discharge to home after PACU  PATIENT DISPOSITION:  PACU - hemodynamically stable.   Delay start of Pharmacological VTE agent (>24hrs) due to surgical blood loss or risk of bleeding: not applicable

## 2016-09-29 NOTE — ED Provider Notes (Signed)
[facility]-EMER[facility]ENCY DEPT Provider Note   CSN: [id number] Arrival date & time: 09/29/16  0406     History   Chief Complaint Chief Complaint  Patient presents with  . Hip Pain    HPI [name] is a 83 y.o. female.  Patient is an 83 year old female with past medical history of prior CVA, hypertension, and bilateral total hip replacement. She is brought by EMS for evaluation of right hip pain. She reports rolling over in bed awkwardly and feeling a pop in her hip, followed by severe pain. She has been unable to ambulate.   The history is provided by the patient.  Hip Pain  This is a new problem. The current episode started less than 1 hour ago. The problem occurs constantly. The problem has not changed since onset.Exacerbated by: Movement and palpation. Nothing relieves the symptoms. She has tried nothing for the symptoms.    Past Medical History:  Diagnosis Date  . Acute ischemic stroke (HCC) 12/28/2015   [name] 12/28/2015  . Arthritis    "hands" (12/28/2015)  . GERD (gastroesophageal reflux disease)   . High cholesterol   . Hypertension   . PONV (postoperative nausea and vomiting)    "w/1st hip replacement"    Patient Active Problem List   Diagnosis Date Noted  . Hyperlipidemia 08/28/2016  . AKI (acute kidney injury) (HCC)   . Frequent stools   . Sleep disturbance   . 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 Benign essential HTN   . 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 Ataxia, late effect of cerebrovascular disease   . Alteration of sensation as late effect of stroke   . CVA (cerebral vascular accident) (HCC) 12/29/2015  . Left sided numbness 12/28/2015  . Nausea & vomiting 12/28/2015  . Diarrhea 12/28/2015  . HTN (hypertension) 12/28/2015  . Acute encephalopathy 12/23/2015  . UTI (urinary tract infection) 12/23/2015  . Hyponatremia 12/23/2015  . Dehydration with hyponatremia 12/23/2015    Past Surgical History:    Procedure Laterality Date  . CATARACT EXTRACTION W/ INTRAOCULAR LENS IMPLANT Right ~ 2011  . JOINT REPLACEMENT    . TOTAL HIP ARTHROPLASTY  2007-2008   left-right    OB History    No data available       Home Medications    Prior to Admission medications   Medication Sig Start Date End Date Taking? Authorizing Provider  amLODipine (NORVASC) 10 MG tablet Take 1 tablet (10 mg total) by mouth daily. 01/06/16   Angiulli, Mcarthur Rossetti, PA-C  aspirin 325 MG tablet Take 1 tablet (325 mg total) by mouth daily. 12/30/15   Joseph Art, DO  atenolol (TENORMIN) 50 MG tablet Take 1 tablet (50 mg total) by mouth daily. 01/06/16   Angiulli, Mcarthur Rossetti, PA-C  latanoprost (XALATAN) 0.005 % ophthalmic solution Place 1 drop into both eyes at bedtime.    [provider]  Multiple Vitamin (MULTI-VITAMINS) TABS Take by mouth.    [provider]  omeprazole (PRILOSEC) 40 MG capsule  12/22/15   [provider]  ondansetron (ZOFRAN) 4 MG tablet Take 4 mg by mouth. 12/27/15   [provider]  pantoprazole (PROTONIX) 40 MG tablet Take 1 tablet (40 mg total) by mouth daily. 01/06/16   Angiulli, Mcarthur Rossetti, PA-C  rosuvastatin (CRESTOR) 5 MG tablet Take 1 tablet (5 mg total) by mouth daily at 6 PM. 01/06/16   Angiulli, Mcarthur Rossetti, PA-C    Family History Family  History  Problem Relation Age of Onset  . CAD Father   . Stroke Father   . Alcohol abuse Brother   . CAD Brother   . Aneurysm Brother     Social History Social History  Substance Use Topics  . Smoking status: Never Smoker  . Smokeless tobacco: Never Used  . Alcohol use No     Allergies   Ativan [lorazepam]; Glucosamine sulfate; Niacin; Phenergan [promethazine]; and Amoxicillin   Review of Systems Review of Systems  All other systems reviewed and are negative.    Physical Exam Updated Vital Signs Pulse 79   Temp 98 F (36.7 C) (Oral)   Resp (!) 22   Ht 4\' 11"  (1.499 m)   Wt 52.2 kg (115 lb)   SpO2  100%   BMI 23.23 kg/m   Physical Exam  Constitutional: She is oriented to person, place, and time. She appears well-developed and well-nourished. No distress.  HENT:  Head: Normocephalic and atraumatic.  Neck: Normal range of motion. Neck supple.  Cardiovascular: Normal rate and regular rhythm.  Exam reveals no gallop and no friction rub.   No murmur heard. Pulmonary/Chest: Effort normal and breath sounds normal. No respiratory distress. She has no wheezes.  Abdominal: Soft. Bowel sounds are normal. She exhibits no distension. There is no tenderness.  Musculoskeletal: Normal range of motion.  There is tenderness to palpation over the lateral right hip. Motor, sensation, and DP pulses are intact and symmetrical to both feet.  Neurological: She is alert and oriented to person, place, and time.  Skin: Skin is warm and dry. She is not diaphoretic.  Nursing note and vitals reviewed.    ED Treatments / Results  Labs (all labs ordered are listed, but only abnormal results are displayed) Labs Reviewed - No data to display  EKG  EKG Interpretation None       Radiology No results found.  Procedures .Sedation Date/Time: 09/29/2016 7:23 AM Performed by: Geoffery LyonsELO, Jacory Kamel Authorized by: Geoffery LyonsELO, Rose-Marie Hickling   Consent:    Consent obtained:  Verbal and written   Consent given by:  Patient   Risks discussed:  Allergic reaction, inadequate sedation, prolonged hypoxia resulting in organ damage and respiratory compromise necessitating ventilatory assistance and intubation Indications:    Procedure performed:  Dislocation reduction   Procedure necessitating sedation performed by:  Different physician   Intended level of sedation:  Moderate (conscious sedation) Pre-sedation assessment:    NPO status caution: unable to specify NPO status     ASA classification: class 2 - patient with mild systemic disease     Neck mobility: normal     Mouth opening:  3 or more finger widths   Pre-sedation  assessments completed and reviewed: airway patency not reviewed and cardiovascular function not reviewed   Procedure details (see MAR for exact dosages):    Preoxygenation:  Nasal cannula   (including critical care time)  Medications Ordered in ED Medications - No data to display   Initial Impression / Assessment and Plan / ED Course  I have reviewed the triage vital signs and the nursing notes.  Pertinent labs & imaging results that were available during my care of the patient were reviewed by me and considered in my medical decision making (see chart for details).  Patient brought by EMS with a dislocated right total hip prosthesis. Conscious sedation was attempted, however reduction was unsuccessful. She did experience an episode of apnea and desaturation during the procedure after receiving a total of 80  mg of propofol. Her ventilation was assisted with bag-valve-mask, then she promptly woke. The reduction was unsuccessful and she will be taken to the operating room for closer airway management and closed reduction.   Final Clinical Impressions(s) / ED Diagnoses   Final diagnoses:  None    New Prescriptions New Prescriptions   No medications on file     Geoffery Lyons, MD 09/29/16 [phone]

## 2016-09-29 NOTE — ED Notes (Signed)
Pt refused to sign consent forms. EDP made aware

## 2016-09-29 NOTE — ED Notes (Signed)
Watch in belonging./

## 2016-09-29 NOTE — Anesthesia Preprocedure Evaluation (Signed)
Anesthesia Evaluation  Patient identified by MRN, date of birth, ID band Patient awake    Reviewed: Allergy & Precautions, NPO status , Patient's Chart, lab work & pertinent test results  History of Anesthesia Complications (+) PONV  Airway Mallampati: II  TM Distance: >3 FB Neck ROM: Full    Dental no notable dental hx.    Pulmonary neg pulmonary ROS,    Pulmonary exam normal breath sounds clear to auscultation       Cardiovascular hypertension, Pt. on medications Normal cardiovascular exam Rhythm:Regular Rate:Normal     Neuro/Psych CVA, Residual Symptoms negative psych ROS   GI/Hepatic negative GI ROS, Neg liver ROS,   Endo/Other  negative endocrine ROS  Renal/GU negative Renal ROS  negative genitourinary   Musculoskeletal negative musculoskeletal ROS (+)   Abdominal   Peds negative pediatric ROS (+)  Hematology negative hematology ROS (+)   Anesthesia Other Findings   Reproductive/Obstetrics negative OB ROS                             Anesthesia Physical Anesthesia Plan  ASA: III  Anesthesia Plan: General   Post-op Pain Management:    Induction: Intravenous  PONV Risk Score and Plan: 4 or greater and Ondansetron and Treatment may vary due to age or medical condition  Airway Management Planned: Mask  Additional Equipment:   Intra-op Plan:   Post-operative Plan:   Informed Consent: I have reviewed the patients History and Physical, chart, labs and discussed the procedure including the risks, benefits and alternatives for the proposed anesthesia with the patient or authorized representative who has indicated his/her understanding and acceptance.   Dental advisory given  Plan Discussed with: CRNA  Anesthesia Plan Comments:         Anesthesia Quick Evaluation

## 2016-09-29 NOTE — ED Triage Notes (Signed)
Ems pt from home was leaning over to pick something up off the nightstand and felt change in right hip. Pt was given of fentanyl ems reports pt went unresponsive post medication briefly and is sensitive to pain meds.

## 2016-09-29 NOTE — Progress Notes (Signed)
Per Dr. Acey Lavarignan, no labs needed.

## 2016-09-29 NOTE — OR Nursing (Signed)
Clarification from Dr. Linna CapriceSwinteck: no further imaging needed in PACU, xray in OR sufficed.

## 2016-09-29 NOTE — Anesthesia Postprocedure Evaluation (Signed)
Anesthesia Post Note  Patient: Farrel Conners  Procedure(s) Performed: Procedure(s) (LRB): CLOSED REDUCTION RIGHT HIP (Right)     Patient location during evaluation: PACU Anesthesia Type: MAC Level of consciousness: awake and alert Pain management: pain level controlled Vital Signs Assessment: post-procedure vital signs reviewed and stable Respiratory status: spontaneous breathing, nonlabored ventilation, respiratory function stable and patient connected to nasal cannula oxygen Cardiovascular status: blood pressure returned to baseline and stable Postop Assessment: no signs of nausea or vomiting Anesthetic complications: no    Last Vitals:  Vitals:   09/29/16 1045 09/29/16 1050  BP:  (!) 103/59  Pulse: 88 86  Resp: 18 (!) 21  Temp:  36.6 C  SpO2: 94% 96%    Last Pain:  Vitals:   09/29/16 0726  TempSrc: Oral                 Montez Hageman

## 2016-09-29 NOTE — H&P (Signed)
[name] is an 83 y.o. female.   Chief Complaint: Dislocation of Right Total Hip HPI: Patient turned over and dislocated her Right Total Hip.This is her first Dislocation. We attempted [name] closed reduction in the ER and was unable to do this.  Past Medical History:  Diagnosis Date  . Acute ischemic stroke (HCC) 12/28/2015   [name]/notes 12/28/2015  . Arthritis    "hands" (12/28/2015)  . GERD (gastroesophageal reflux disease)   . High cholesterol   . Hypertension   . PONV (postoperative nausea and vomiting)    "w/1st hip replacement"    Past Surgical History:  Procedure Laterality Date  . CATARACT EXTRACTION W/ INTRAOCULAR LENS IMPLANT Right ~ 2011  . JOINT REPLACEMENT    . TOTAL HIP ARTHROPLASTY  2007-2008   left-right    Family History  Problem Relation Age of Onset  . CAD Father   . Stroke Father   . Alcohol abuse Brother   . CAD Brother   . Aneurysm Brother    Social History:  reports that she has never smoked. She has never used smokeless tobacco. She reports that she does not drink alcohol or use drugs.  Allergies:  Allergies  Allergen Reactions  . Ativan [Lorazepam]     altered  . Glucosamine Sulfate Other (See Comments)  . Niacin Other (See Comments)  . Phenergan [Promethazine]     "Seeing things"    . Amoxicillin Rash     (Not in [name] hospital admission)  No results found for this or any previous visit (from the past 48 hour(s)). Dg Hip Unilat W Or Wo Pelvis 2-3 Views Right  Result Date: 09/29/2016 CLINICAL DATA:  Right hip pain after leaning over to pick something up EXAM: DG HIP (WITH OR WITHOUT PELVIS) 2-3V RIGHT COMPARISON:  None. FINDINGS: There is [name] superior-lateral dislocation at the right total hip arthroplasty. No fracture. Bony pelvis is intact. Pubic symphysis and sacroiliac joints are intact. IMPRESSION: Right THA dislocation. Electronically Signed   By: Ellery Plunkaniel R Mitchell M.D.   On: 09/29/2016 05:07    Review of Systems  Constitutional: Negative.    HENT: Negative.   Eyes: Negative.   Respiratory: Negative.   Cardiovascular: Negative.   Gastrointestinal: Negative.   Genitourinary: Negative.   Musculoskeletal: Positive for joint pain.  Skin: Negative.   Neurological: Negative.   Endo/Heme/Allergies: Negative.     Blood pressure 118/79, pulse 76, temperature 97.8 F (36.6 C), temperature source Oral, resp. rate 16, height 4\' 11"  (1.499 m), weight 52.2 kg (115 lb), SpO2 100 %. Physical Exam  Constitutional: She appears well-developed.  HENT:  Head: Normocephalic.  Eyes: Pupils are equal, round, and reactive to light.  Cardiovascular: Normal rate.   Respiratory: Effort normal.  GI: Soft.  Musculoskeletal:  Closed Dislocation of [name] Right Total Hip  Neurological: She is alert.  Weakness of Right foot dorsiflexors.  Skin: Skin is warm.     Assessment/Plan Closed Reduction right total hip by DR.SWINTECK.  Jacki ConesGIOFFRE,Kristen Chanthavong A, MD 09/29/2016, 7:29 AM

## 2016-09-29 NOTE — Sedation Documentation (Signed)
Othro MD attempts to reduce

## 2016-09-29 NOTE — Transfer of Care (Signed)
Immediate Anesthesia Transfer of Care Note  Patient: [name]  Procedure(s) Performed: Procedure(s): CLOSED REDUCTION RIGHT HIP (Right)  Patient Location: PACU  Anesthesia Type:MAC  Level of Consciousness: awake, alert  and patient cooperative  Airway & Oxygen Therapy: Patient Spontanous Breathing and Patient connected to nasal cannula oxygen  Post-op Assessment: Report given to RN, Post -op Vital signs reviewed and stable, Patient moving all extremities X 4 and Patient able to stick tongue midline  Post vital signs: Reviewed and stable  Last Vitals:  Vitals:   09/29/16 0858 09/29/16 1019  BP: (!) 124/102 (!) 123/57  Pulse: 81 94  Resp: 19 16  Temp:    SpO2: 97% 97%    Last Pain:  Vitals:   09/29/16 0726  TempSrc: Oral         Complications: No apparent anesthesia complications

## 2016-09-29 NOTE — Sedation Documentation (Signed)
RT starts bagging

## 2016-09-29 NOTE — Interval H&P Note (Signed)
History and Physical Interval Note:  09/29/2016 9:24 AM  [name]  has presented today for surgery, with the diagnosis of dislocated right total hip  The various methods of treatment have been discussed with the patient and family. After consideration of risks, benefits and other options for treatment, the patient has consented to  Procedure(s): CLOSED REDUCTION HIP (Right) as a surgical intervention .  The patient's history has been reviewed, patient examined, no change in status, stable for surgery.  I have reviewed the patient's chart and labs.  Questions were answered to the patient's satisfaction.    I was asked to assume care by Dr. Darrelyn HillockGioffre. Risks, benefits, and alternatives to the above mentioned procedure were explained.    Diedre Maclellan, Cloyde ReamsBrian James

## 2016-09-29 NOTE — Op Note (Signed)
NAME:  [name], [name]                      ACCOUNT NO.:  MEDICAL RECORD NO.:  [id number]  LOCATION:                                 FACILITY:  PHYSICIAN:  Samson FredericBrian Finola Rosal, MD     DATE OF BIRTH:  March 25, 1933  DATE OF PROCEDURE:  09/29/2016 DATE OF DISCHARGE:                              OPERATIVE REPORT   SURGEON:  Samson FredericBrian Guido Comp, MD.  ASSISTANT:  Staff.  PREOPERATIVE DIAGNOSIS:  Periprosthetic right hip dislocation.  POSTOPERATIVE DIAGNOSIS:  Periprosthetic right hip dislocation.  PROCEDURE PERFORMED:  Closed reduction of dislocated right total hip arthroplasty.  ANESTHESIA:  Monitored anesthesia care.  ESTIMATED BLOOD LOSS:  Zero.  ANTIBIOTICS:  None were given as none were indicated.  COMPLICATIONS:  None.  TUBES AND DRAINS:  None.  SPECIMENS:  None.  DISPOSITION:  Stable to PACU.  INDICATIONS:  The patient is an 83 year old female, has a remote history of bilateral total hip arthroplasties by Dr. Ollen GrossFrank Aluisio.  She dislocated her right hip last night, turning over in bed.  This is her first right hip dislocation. She had right hip pain, inability to weightbear.  She was brought to the emergency department and found to have a dislocated right total hip arthroplasty. I was asked to assume care by Dr. Darrelyn HillockGioffre for closed reduction in the operating room.  The risks, benefits, and alternatives were explained. She elected to proceed.  DESCRIPTION OF PROCEDURE IN DETAIL:  I identified the patient in the holding area.  I marked the surgical site.  She was taken to the operating room and IV sedation, monitored anesthesia care was administered on the hospital stretcher.  I then used a single reduction maneuver to reduce the hip.  There was a palpable and audible clunk. The hip was stable.  I obtained an AP portable pelvis x-ray which confirmed reduction.  Knee immobilizer was placed.  The patient was then aroused from anesthesia, taken to PACU in stable condition.   Sponge, needle, and instrument counts were correct at the end of the case x2. There were no complications.  Postoperatively, the patient will be discharged home from the PACU.  She may weightbear as tolerated.  Wear knee immobilizer at all times. Posterior hip precautions.  Follow up with Dr. Lequita HaltAluisio in 2 weeks.          ______________________________ Samson FredericBrian Tran Arzuaga, MD     BS/MEDQ  D:  09/29/2016  T:  09/29/2016  Job:  [id number]

## 2016-09-29 NOTE — Discharge Instructions (Signed)
Wear knee immobilizer at all times. Remove once daily for skin check.

## 2016-09-30 ENCOUNTER — Encounter (HOSPITAL_COMMUNITY): Payer: Self-pay | Admitting: Orthopedic Surgery

## 2016-10-02 NOTE — Op Note (Signed)
[name]:  [name], [name]                 ACCOUNT NO.:  [id number]  MEDICAL RECORD NO.:  [id number]  LOCATION:                                 FACILITY:  PHYSICIAN:  Georges Lynchonald A. Darrelyn HillockGioffre, M.D.     DATE OF BIRTH:  DATE OF PROCEDURE:  09/29/2016 DATE OF DISCHARGE:                              OPERATIVE REPORT   SURGEON:  Georges Lynchonald A. Steffen Hase, M.D.  ASSISTANT:  None.  PREOPERATIVE DIAGNOSIS:  Closed dislocation of a right total hip done by Dr. Lequita HaltAluisio 10 years ago.  POSTOPERATIVE DIAGNOSIS:  Closed dislocation of a right total hip done by Dr. Lequita HaltAluisio 10 years ago.  OPERATION:  An attempted closed reduction of a right dislocated total hip in the emergency room under propofol anesthesia.  DESCRIPTION OF PROCEDURE:  Prior to propofol anesthesia, I had a consent signed for closed reduction in the ER.  The appropriate time-out was carried out first, I also marked the appropriate right hip.  Note, when I attempted a closed reduction on her, it was very difficult to try to reduce her hip.  The propofol was not covering her pain.  We aborted the case at this time because of the oxygen saturation levels were low.  We felt that the safest thing to do now was a stop the procedure and schedule her for surgery to do a closed reduction under general anesthesia.  My associate, Dr. Linna CapriceSwinteck [facility].  He will do this case to follow.  The surgeon was Dr. Darrelyn HillockGioffre. Note, I did not have the medical record number, I am dictating this from my office.  I put in [id number], please look up the corrected number.          ______________________________ Georges Lynchonald A. Darrelyn HillockGioffre, M.D.     RAG/MEDQ  D:  09/29/2016  T:  09/30/2016  Job:  [id number]

## 2017-02-27 ENCOUNTER — Ambulatory Visit: Payer: Medicare Other | Admitting: Nurse Practitioner

## 2017-07-12 ENCOUNTER — Other Ambulatory Visit: Payer: Self-pay | Admitting: Physician Assistant

## 2017-07-12 DIAGNOSIS — R1011 Right upper quadrant pain: Secondary | ICD-10-CM

## 2017-07-23 ENCOUNTER — Ambulatory Visit: Payer: Self-pay | Admitting: General Surgery

## 2017-07-24 ENCOUNTER — Other Ambulatory Visit: Payer: Medicare Other

## 2017-07-27 ENCOUNTER — Other Ambulatory Visit: Payer: Medicare Other

## 2017-08-06 NOTE — Pre-Procedure Instructions (Signed)
Ardith DarkJenie S Pharr  08/06/2017      [id number] - [place], [place] - [address] [place] [id number] Phone: [phone] Fax: [phone]    Your procedure is scheduled on jULY 19  Report to [facility] Ad[facility] at [phone] A.M.  Call this number if you have problems the morning of surgery:  [phone]   Remember:  Do not eat or drink after midnight.      Take these medicines the morning of surgery with A SIP OF WATER  amLODipine (NORVASC) atenolol (TENORMIN)  pantoprazole (PROTONIX) traMADol (ULTRAM)  EYE DROP IF NEEDED  7 days prior to surgery STOP taking any Aspirin(unless otherwise instructed by your surgeon), Aleve, Naproxen, Ibuprofen, Motrin, Advil, Goody's, BC's, all herbal medications, fish oil, and all vitamins  STOP ASPIRIN 5 DAYS PRIOR PER SURGEON   Do not wear jewelry, make-up or nail polish.  Do not wear lotions, powders, or perfumes, or deodorant.  Do not shave 48 hours prior to surgery.    Do not bring valuables to the hospital.  [facility] is not responsible for any belongings or valuables.  Contacts, dentures or bridgework may not be worn into surgery.  Leave your suitcase in the car.  After surgery it may be brought to your room.  For patients admitted to the hospital, discharge time will be determined by your treatment team.  Patients discharged the day of surgery will not be allowed to drive home.    Special instructions:   [facility]- Preparing For Surgery  Before surgery, you can play an important role. Because skin is not sterile, your skin needs to be as free of germs as possible. You can reduce the number of germs on your skin by washing with CHG (chlorahexidine gluconate) Soap before surgery.  CHG is an antiseptic cleaner which kills germs and bonds with the skin to continue killing germs even after washing.    Oral Hygiene is also important to reduce your risk of  infection.  Remember - BRUSH YOUR TEETH THE MORNING OF SURGERY WITH YOUR REGULAR TOOTHPASTE  Please do not use if you have an allergy to CHG or antibacterial soaps. If your skin becomes reddened/irritated stop using the CHG.  Do not shave (including legs and underarms) for at least 48 hours prior to first CHG shower. It is OK to shave your face.  Please follow these instructions carefully.   1. Shower the NIGHT BEFORE SURGERY and the MORNING OF SURGERY with CHG.   2. If you chose to wash your hair, wash your hair first as usual with your normal shampoo.  3. After you shampoo, rinse your hair and body thoroughly to remove the shampoo.  4. Use CHG as you would any other liquid soap. You can apply CHG directly to the skin and wash gently with a scrungie or a clean washcloth.   5. Apply the CHG Soap to your body ONLY FROM THE NECK DOWN.  Do not use on open wounds or open sores. Avoid contact with your eyes, ears, mouth and genitals (private parts). Wash Face and genitals (private parts)  with your normal soap.  6. Wash thoroughly, paying special attention to the area where your surgery will be performed.  7. Thoroughly rinse your body with warm water from the neck down.  8. DO NOT shower/wash with your normal soap after using and rinsing off the CHG Soap.  9.  Pat yourself dry with a CLEAN TOWEL.  10. Wear CLEAN PAJAMAS to bed the night before surgery, wear comfortable clothes the morning of surgery  11. Place CLEAN SHEETS on your bed the night of your first shower and DO NOT SLEEP WITH PETS.    Day of Surgery:  Do not apply any deodorants/lotions.  Please wear clean clothes to the hospital/surgery center.   Remember to brush your teeth WITH YOUR REGULAR TOOTHPASTE.    Please read over the following fact sheets that you were given.

## 2017-08-07 ENCOUNTER — Other Ambulatory Visit: Payer: Self-pay

## 2017-08-07 ENCOUNTER — Encounter (HOSPITAL_COMMUNITY): Payer: Self-pay

## 2017-08-07 ENCOUNTER — Encounter (HOSPITAL_COMMUNITY)
Admission: RE | Admit: 2017-08-07 | Discharge: 2017-08-07 | Disposition: A | Discharge status: Home / Self Care | Payer: Medicare Other | Source: Ambulatory Visit | Attending: General Surgery | Admitting: General Surgery

## 2017-08-07 ENCOUNTER — Ambulatory Visit (HOSPITAL_COMMUNITY)
Admission: RE | Admit: 2017-08-07 | Discharge: 2017-08-07 | Disposition: A | Discharge status: Home / Self Care | Payer: Medicare Other | Source: Ambulatory Visit | Attending: General Surgery | Admitting: General Surgery

## 2017-08-07 DIAGNOSIS — I700 Atherosclerosis of aorta: Secondary | ICD-10-CM | POA: Insufficient documentation

## 2017-08-07 DIAGNOSIS — Z01810 Encounter for preprocedural cardiovascular examination: Secondary | ICD-10-CM | POA: Diagnosis not present

## 2017-08-07 DIAGNOSIS — Z01818 Encounter for other preprocedural examination: Secondary | ICD-10-CM | POA: Diagnosis not present

## 2017-08-07 DIAGNOSIS — R9431 Abnormal electrocardiogram [ECG] [EKG]: Secondary | ICD-10-CM | POA: Diagnosis not present

## 2017-08-07 DIAGNOSIS — Z01812 Encounter for preprocedural laboratory examination: Secondary | ICD-10-CM | POA: Diagnosis not present

## 2017-08-07 HISTORY — DX: Calculus of gallbladder without cholecystitis without obstruction: K80.20

## 2017-08-07 LAB — COMPREHENSIVE METABOLIC PANEL
ALBUMIN: 4.7 g/dL (ref 3.5–5.0)
ALK PHOS: 63 U/L (ref 38–126)
ALT: 15 U/L (ref 0–44)
ANION GAP: 15 (ref 5–15)
AST: 29 U/L (ref 15–41)
BUN: 10 mg/dL (ref 8–23)
CALCIUM: 9.8 mg/dL (ref 8.9–10.3)
CO2: 26 mmol/L (ref 22–32)
CREATININE: 0.96 mg/dL (ref 0.44–1.00)
Chloride: 98 mmol/L (ref 98–111)
GFR calc non Af Amer: 54 mL/min — ABNORMAL LOW (ref >60)
Glucose, Bld: 131 mg/dL — ABNORMAL HIGH (ref 70–99)
Potassium: 3.9 mmol/L (ref 3.5–5.1)
SODIUM: 139 mmol/L (ref 135–145)
Total Bilirubin: 1.8 mg/dL — ABNORMAL HIGH (ref 0.3–1.2)
Total Protein: 7.7 g/dL (ref 6.5–8.1)

## 2017-08-07 LAB — CBC WITH DIFFERENTIAL/PLATELET
Abs Immature Granulocytes: 0.2 10*3/uL — ABNORMAL HIGH (ref 0.0–0.1)
BASOS ABS: 0 10*3/uL (ref 0.0–0.1)
BASOS PCT: 0 %
EOS PCT: 0 %
Eosinophils Absolute: 0 10*3/uL (ref 0.0–0.7)
HCT: 41.5 % (ref 36.0–46.0)
Hemoglobin: 13.9 g/dL (ref 12.0–15.0)
Immature Granulocytes: 2 %
Lymphocytes Relative: 35 %
Lymphs Abs: 2.6 10*3/uL (ref 0.7–4.0)
MCH: 30.2 pg (ref 26.0–34.0)
MCHC: 33.5 g/dL (ref 30.0–36.0)
MCV: 90.2 fL (ref 78.0–100.0)
MONO ABS: 0.7 10*3/uL (ref 0.1–1.0)
Monocytes Relative: 10 %
Neutro Abs: 3.8 10*3/uL (ref 1.7–7.7)
Neutrophils Relative %: 53 %
PLATELETS: 278 10*3/uL (ref 150–400)
RBC: 4.6 MIL/uL (ref 3.87–5.11)
RDW: 11.9 % (ref 11.5–15.5)
WBC: 7.3 10*3/uL (ref 4.0–10.5)

## 2017-08-07 NOTE — Progress Notes (Signed)
PCP - Dr. Andrey CampanileWilson  Cardiologist - Denies  Chest x-ray - 08/07/17  EKG - 08/07/17  Stress Test - Denies  ECHO - 12/29/15 (E)  Cardiac Cath - Denies  Sleep Study - Denies CPAP - None  LABS- 08/07/17: CBC w/D, CMP  ASA- LD- 7/13  Anesthesia- No  Pt denies having chest pain, sob, or fever at this time. All instructions explained to the pt, with a verbal understanding of the material. Pt agrees to go over the instructions while at home for a better understanding. The opportunity to ask questions was provided.

## 2017-08-16 ENCOUNTER — Encounter (HOSPITAL_COMMUNITY): Payer: Self-pay | Admitting: Emergency Medicine

## 2017-08-16 ENCOUNTER — Emergency Department (HOSPITAL_COMMUNITY)
Admission: EM | Admit: 2017-08-16 | Discharge: 2017-08-16 | Disposition: A | Discharge status: Home / Self Care | Payer: Medicare Other | Attending: Emergency Medicine | Admitting: Emergency Medicine

## 2017-08-16 DIAGNOSIS — Z79899 Other long term (current) drug therapy: Secondary | ICD-10-CM | POA: Diagnosis not present

## 2017-08-16 DIAGNOSIS — I10 Essential (primary) hypertension: Secondary | ICD-10-CM | POA: Diagnosis not present

## 2017-08-16 DIAGNOSIS — R1011 Right upper quadrant pain: Secondary | ICD-10-CM | POA: Diagnosis present

## 2017-08-16 DIAGNOSIS — Z7982 Long term (current) use of aspirin: Secondary | ICD-10-CM | POA: Insufficient documentation

## 2017-08-16 DIAGNOSIS — K8050 Calculus of bile duct without cholangitis or cholecystitis without obstruction: Secondary | ICD-10-CM | POA: Diagnosis not present

## 2017-08-16 DIAGNOSIS — Z96643 Presence of artificial hip joint, bilateral: Secondary | ICD-10-CM | POA: Insufficient documentation

## 2017-08-16 LAB — URINALYSIS, ROUTINE W REFLEX MICROSCOPIC
Bilirubin Urine: NEGATIVE
GLUCOSE, UA: NEGATIVE mg/dL
Hgb urine dipstick: NEGATIVE
Ketones, ur: 5 mg/dL — AB
LEUKOCYTES UA: NEGATIVE
Nitrite: NEGATIVE
PH: 7 (ref 5.0–8.0)
PROTEIN: NEGATIVE mg/dL
SPECIFIC GRAVITY, URINE: 1.002 — AB (ref 1.005–1.030)

## 2017-08-16 LAB — CBC
HEMATOCRIT: 40.5 % (ref 36.0–46.0)
HEMOGLOBIN: 14.1 g/dL (ref 12.0–15.0)
MCH: 30.3 pg (ref 26.0–34.0)
MCHC: 34.8 g/dL (ref 30.0–36.0)
MCV: 87.1 fL (ref 78.0–100.0)
Platelets: 222 10*3/uL (ref 150–400)
RBC: 4.65 MIL/uL (ref 3.87–5.11)
RDW: 11.8 % (ref 11.5–15.5)
WBC: 6 10*3/uL (ref 4.0–10.5)

## 2017-08-16 LAB — COMPREHENSIVE METABOLIC PANEL
ALT: 16 U/L (ref 0–44)
AST: 31 U/L (ref 15–41)
Albumin: 4.5 g/dL (ref 3.5–5.0)
Alkaline Phosphatase: 55 U/L (ref 38–126)
Anion gap: 13 (ref 5–15)
BUN: 6 mg/dL — ABNORMAL LOW (ref 8–23)
CHLORIDE: 99 mmol/L (ref 98–111)
CO2: 24 mmol/L (ref 22–32)
Calcium: 9.5 mg/dL (ref 8.9–10.3)
Creatinine, Ser: 0.86 mg/dL (ref 0.44–1.00)
Glucose, Bld: 121 mg/dL — ABNORMAL HIGH (ref 70–99)
POTASSIUM: 3.9 mmol/L (ref 3.5–5.1)
SODIUM: 136 mmol/L (ref 135–145)
Total Bilirubin: 1.6 mg/dL — ABNORMAL HIGH (ref 0.3–1.2)
Total Protein: 6.9 g/dL (ref 6.5–8.1)

## 2017-08-16 LAB — LIPASE, BLOOD: Lipase: 39 U/L (ref 11–51)

## 2017-08-16 MED ORDER — OXYCODONE-ACETAMINOPHEN 5-325 MG PO TABS
1.0000 | ORAL_TABLET | Freq: Once | ORAL | Status: AC
  Administered 2017-08-16: 1 via ORAL
  Filled 2017-08-16: qty 1

## 2017-08-16 MED ORDER — OXYCODONE-ACETAMINOPHEN 5-325 MG PO TABS: 0.5000 | ORAL_TABLET | Freq: Four times a day (QID) | ORAL | 0 refills | Status: DC | PRN

## 2017-08-16 MED ORDER — LACTATED RINGERS IV BOLUS
1000.0000 mL | Freq: Once | INTRAVENOUS | Status: AC
  Administered 2017-08-16: 1000 mL via INTRAVENOUS

## 2017-08-16 MED ORDER — SODIUM CHLORIDE 0.9 % IV SOLN
1.0000 g | INTRAVENOUS | Status: AC
  Administered 2017-08-17: 1 g via INTRAVENOUS
  Filled 2017-08-16: qty 1

## 2017-08-16 MED ORDER — ONDANSETRON 4 MG PO TBDP
4.0000 mg | ORAL_TABLET | Freq: Once | ORAL | Status: AC
  Administered 2017-08-16: 4 mg via ORAL
  Filled 2017-08-16: qty 1

## 2017-08-16 MED ORDER — ONDANSETRON 4 MG PO TBDP: 4.0000 mg | ORAL_TABLET | Freq: Three times a day (TID) | ORAL | 0 refills | Status: DC | PRN

## 2017-08-16 NOTE — ED Triage Notes (Signed)
Patient complains of abdominal pain, states she is scheduled to have her gall bladder removed tomorrow. States she has had the procedure schedule for two months but states the pain has gotten too great and she cannot wait until tomorrow. Patient alert, oriented, and in no apparent distress at this time.

## 2017-08-16 NOTE — ED Provider Notes (Signed)
I saw and evaluated the patient, reviewed the resident's note and I agree with the findings and plan.  Pertinent History: The pt has hx of 2 months of abdominal pain - has had persistent pain, nausea with [place] on 6/17 showing stones without edema - she has had ongoing dehydration / nausea - comes to ED for same - has seen Dr. Lindie Spruce who will operate tomorrow per the daughter.    Pertinent Exam findings: On my exam the patient has a very soft and nontender abdomen, she is not jaundiced, she has no guarding or tenderness in the abdomen, heart and lung exams are normal, she does not appear dry the mucous membranes,  Discussed the care with the family members, they are concerned about her being dehydrated, that being said she has surgery in the morning, does not appear clinically dehydrated, labs show normal creatinine, normal BUN, normal glucose, normal sodium and potassium and normal liver function with a bilirubin of 1.6, AST of 31 and ALT of 16.  Albumin is 4.5 and alkaline phosphatase is 55.  There is no leukocytosis with a white blood cell count of 6000 had a hemoglobin of 14.1.  I will discussed the care with the general surgeon on-call, Dr. Dwain Sarna. - recommends d/c home to f/u in the AM - no antibiotics recommended - family will be updated.  Results for orders placed or performed during the hospital encounter of 08/16/17  Comprehensive metabolic panel  Result Value Ref Range   Sodium 136 135 - 145 mmol/L   Potassium 3.9 3.5 - 5.1 mmol/L   Chloride 99 98 - 111 mmol/L   CO2 24 22 - 32 mmol/L   Glucose, Bld 121 (H) 70 - 99 mg/dL   BUN 6 (L) 8 - 23 mg/dL   Creatinine, Ser 0.98 0.44 - 1.00 mg/dL   Calcium 9.5 8.9 - 11.9 mg/dL   Total Protein 6.9 6.5 - 8.1 g/dL   Albumin 4.5 3.5 - 5.0 g/dL   AST 31 15 - 41 U/L   ALT 16 0 - 44 U/L   Alkaline Phosphatase 55 38 - 126 U/L   Total Bilirubin 1.6 (H) 0.3 - 1.2 mg/dL   GFR calc non Af Amer >60 >60 mL/min   GFR calc Af Amer >60 >60 mL/min   Anion  gap 13 5 - 15  CBC  Result Value Ref Range   WBC 6.0 4.0 - 10.5 K/uL   RBC 4.65 3.87 - 5.11 MIL/uL   Hemoglobin 14.1 12.0 - 15.0 g/dL   HCT 14.7 82.9 - 56.2 %   MCV 87.1 78.0 - 100.0 fL   MCH 30.3 26.0 - 34.0 pg   MCHC 34.8 30.0 - 36.0 g/dL   RDW 13.0 86.5 - 78.4 %   Platelets 222 150 - 400 K/uL  Lipase, blood  Result Value Ref Range   Lipase 39 11 - 51 U/L   Chest 2 View  Result Date: 08/07/2017 CLINICAL DATA:  Preoperative exam prior laparoscopic cholecystectomy. History of CVA, hypertension, nonsmoker. EXAM: CHEST - 2 VIEW COMPARISON:  PA and lateral chest x-ray of October 16, 2005 FINDINGS: The lungs are well-expanded. There is no focal infiltrate. There is no pleural effusion. The heart and pulmonary vascularity are normal. There is calcification in the wall of the thoracic aorta. The mediastinum is normal in width. The bony thorax is unremarkable. IMPRESSION: There is no active cardiopulmonary disease. Thoracic aortic atherosclerosis. Electronically Signed   By: David  [place] M.D.   On: 08/07/2017  14:33      Final diagnoses:  Biliary colic      Eber HongMiller, Montia Haslip, MD 08/21/17 1130

## 2017-08-16 NOTE — ED Provider Notes (Signed)
Patient placed in Quick Look pathway, seen and evaluated   Chief Complaint: abdominal pain  HPI:  [name] is a 84 y.o. female who presents to the ED with abdominal pain. Patient reports she is scheduled for gall bladder surgery but the pain was so bad today she had to come in.   ROS: GI: nausea, abdominal pain  Physical Exam:  BP (!) 143/56 (BP Location: Right Arm)   Pulse 68   Temp 99.2 F (37.3 C) (Oral)   Resp 16   SpO2 100%    Gen: No distress appears uncomfortable  Neuro: Awake and Alert  Skin: Warm and dry  Abdomen: soft tender with palpation epigastric and RUQ.       Initiation of care has begun. The patient has been counseled on the process, plan, and necessity for staying for the completion/evaluation, and the remainder of the medical screening examination    Janne Napoleoneese, Kristen M, NP 08/16/17 1303    Gerhard MunchLockwood, Robert, MD 08/16/17 [phone]

## 2017-08-16 NOTE — ED Notes (Signed)
Pt verbalizes understanding of d/c instructions. Pt received prescriptions. Pt taken to lobby in wheelchair with all belongings and with family.   

## 2017-08-16 NOTE — ED Provider Notes (Addendum)
[name] [facility] EMERGENCY DEPARTMENT Provider Note   CSN: [id number] Arrival date & time: 08/16/17  1240  History   Chief Complaint Chief Complaint  Patient presents with  . Abdominal Pain   HPI  Patient is an 84 year old female with history of CVA, HTN, and symptomatic cholelithiasis presenting to the ED for chronic RUQ abdominal pain. She states she has outpatient elective cholecystectomy scheduled for tomorrow morning but she has been unable to tolerate her symptoms today. She has had persistent poorly localized right-sided abdominal pain for the past 2 months with intermittent nausea and vomiting. She has had trouble keeping down her bland diet for the last few days. She is taking tramadol at home without antiemetics. No fever, diarrhea, or urinary symptoms. Other new symptoms or recent illness.  Past Medical History:  Diagnosis Date  . Acute ischemic stroke (HCC) 12/28/2015   [name] 12/28/2015  . Arthritis    "hands" (12/28/2015)  . GERD (gastroesophageal reflux disease)   . High cholesterol   . Hypertension   . PONV (postoperative nausea and vomiting)    "w/1st hip replacement"  . Symptomatic cholelithiasis     Patient Active Problem List   Diagnosis Date Noted  . Hyperlipidemia 08/28/2016  . AKI (acute kidney injury) (HCC)   . Frequent stools   . Sleep disturbance   . Hemiparesis of right nondominant side due to cerebral infarction   . Benign essential HTN   . 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 Ataxia, late effect of cerebrovascular disease   . Alteration of sensation as late effect of stroke   . CVA (cerebral vascular accident) (HCC) 12/29/2015  . Left sided numbness 12/28/2015  . Nausea & vomiting 12/28/2015  . Diarrhea 12/28/2015  . HTN (hypertension) 12/28/2015  . Acute encephalopathy 12/23/2015  . UTI (urinary tract infection) 12/23/2015  . Hyponatremia 12/23/2015  . Dehydration with hyponatremia 12/23/2015    Past Surgical  History:  Procedure Laterality Date  . CATARACT EXTRACTION W/ INTRAOCULAR LENS IMPLANT Right ~ 2011  . HIP CLOSED REDUCTION Right 09/29/2016   Procedure: CLOSED REDUCTION RIGHT HIP;  Surgeon: Samson Frederic, MD;  Location: [facility] OR;  Service: Orthopedics;  Laterality: Right;  . JOINT REPLACEMENT    . TOTAL HIP ARTHROPLASTY  2007-2008   left-right     OB History   None      Home Medications    Prior to Admission medications   Medication Sig Start Date End Date Taking? Authorizing Provider  amLODipine (NORVASC) 10 MG tablet Take 1 tablet (10 mg total) by mouth daily. 01/06/16   Angiulli, Mcarthur Rossetti, PA-C  aspirin 325 MG tablet Take 1 tablet (325 mg total) by mouth daily. 12/30/15   Joseph Art, DO  atenolol (TENORMIN) 50 MG tablet Take 1 tablet (50 mg total) by mouth daily. 01/06/16   Angiulli, Mcarthur Rossetti, PA-C  Calcium Carb-Cholecalciferol (CALCIUM 600+D3 PO) Take 1 tablet by mouth 2 (two) times daily.    [provider]  latanoprost (XALATAN) 0.005 % ophthalmic solution Place 1 drop into both eyes at bedtime.    [provider]  Multiple Vitamin (MULTI-VITAMINS) TABS Take 1 tablet by mouth daily.     [provider]  Omega-3 Fatty Acids (FISH OIL PO) Take 1 tablet by mouth daily.    [provider]  ondansetron (ZOFRAN ODT) 4 MG disintegrating tablet Take 1 tablet (4 mg total) by mouth every 8 (eight) hours as needed for nausea or vomiting. 08/16/17   Vesta Mixer,  Bennie Pierini, MD  oxyCODONE-acetaminophen (PERCOCET/ROXICET) 5-325 MG tablet Take 0.5 tablets by mouth every 6 (six) hours as needed for severe pain. 08/16/17   Cecille Po, MD  pantoprazole (PROTONIX) 40 MG tablet Take 1 tablet (40 mg total) by mouth daily. 01/06/16   Angiulli, Mcarthur Rossetti, PA-C  rosuvastatin (CRESTOR) 5 MG tablet Take 1 tablet (5 mg total) by mouth daily at 6 PM. 01/06/16   Angiulli, Mcarthur Rossetti, PA-C  traMADol (ULTRAM) 50 MG tablet Take 50 mg by mouth every 12 (twelve) hours as  needed. for pain 07/20/17   [provider]    Family History Family History  Problem Relation Age of Onset  . CAD Father   . Stroke Father   . Alcohol abuse Brother   . CAD Brother   . Aneurysm Brother     Social History Social History   Tobacco Use  . Smoking status: Never Smoker  . Smokeless tobacco: Never Used  Substance Use Topics  . Alcohol use: No  . Drug use: No     Allergies   Ativan [lorazepam]; Glucosamine sulfate; Niacin; Phenergan [promethazine]; and Amoxicillin   Review of Systems Review of Systems  Constitutional: Positive for appetite change. Negative for fever.  HENT: Negative for congestion and sore throat.   Eyes: Negative for visual disturbance.  Respiratory: Negative for shortness of breath.   Cardiovascular: Negative for chest pain.  Gastrointestinal: Positive for abdominal pain, nausea and vomiting. Negative for diarrhea.  Genitourinary: Negative for dysuria.  Musculoskeletal: Negative for neck pain.  Neurological: Negative for syncope.  All other systems reviewed and are negative.    Physical Exam Updated Vital Signs BP (!) 138/56   Pulse 79   Temp 99.2 F (37.3 C) (Oral)   Resp 14   SpO2 95%   Physical Exam  Constitutional: She is oriented to person, place, and time. No distress.  HENT:  Head: Normocephalic and atraumatic.  Mouth/Throat: Oropharynx is clear and moist.  Eyes: Pupils are equal, round, and reactive to light. Conjunctivae are normal.  Neck: Neck supple. No tracheal deviation present.  Cardiovascular: Normal rate, regular rhythm, normal heart sounds and intact distal pulses.  No murmur heard. Pulmonary/Chest: Effort normal and breath sounds normal. No stridor. No respiratory distress. She has no wheezes. She has no rales.  Abdominal: Soft. She exhibits no distension and no mass. There is tenderness (mild, RUQ). There is no guarding and no CVA tenderness.  Musculoskeletal: She exhibits no edema or deformity.    Neurological: She is alert and oriented to person, place, and time.  Skin: Skin is warm and dry.  Psychiatric: She has a normal mood and affect. Her behavior is normal.  Nursing note and vitals reviewed.    ED Treatments / Results  Labs (all labs ordered are listed, but only abnormal results are displayed) Labs Reviewed  COMPREHENSIVE METABOLIC PANEL - Abnormal; Notable for the following components:      Result Value   Glucose, Bld 121 (*)    BUN 6 (*)    Total Bilirubin 1.6 (*)    All other components within normal limits  URINALYSIS, ROUTINE W REFLEX MICROSCOPIC - Abnormal; Notable for the following components:   Color, Urine COLORLESS (*)    Specific Gravity, Urine 1.002 (*)    Ketones, ur 5 (*)    All other components within normal limits  CBC  LIPASE, BLOOD   Procedures Procedures (including critical care time)  Medications Ordered in ED Medications  ondansetron (ZOFRAN-ODT)  disintegrating tablet 4 mg (4 mg Oral Given 08/16/17 1744)  lactated ringers bolus 1,000 mL (0 mLs Intravenous Stopped 08/16/17 1856)  oxyCODONE-acetaminophen (PERCOCET/ROXICET) 5-325 MG per tablet 1 tablet (1 tablet Oral Given 08/16/17 1746)     Initial Impression / Assessment and Plan / ED Course  I have reviewed the triage vital signs and the nursing notes.  Pertinent labs & imaging results that were available during my care of the patient were reviewed by me and considered in my medical decision making (see chart for details).  This is an 84 year old female with history of known symptomatic cholelithiasis scheduled for elective cholecystectomy tomorrow presenting to the ED for abdominal pain and nausea as above. Clinical picture most consistent with chronic biliary colic. Screening labs are reassuring. Her abdomen is benign. Do not suspect cholecystitis and do not feel repeat imaging is warranted. Patient given IV fluids, Zofran, and Percocet with excellent improvement in symptoms. She is able to  the ED and well-appearing on recheck. Case briefly discussed with on-call general surgeon Dr. Dwain SarnaWakefield who agrees with management and does not feel antibiotics warranted. Will discharge home for surgery tomorrow.  Patient informed of all ED findings. Return precautions and follow up plan reviewed. Discussed fall precautions with stronger pain medication tonight, encouraged family member to stay with patient. All questions answered.   Final Clinical Impressions(s) / ED Diagnoses   Final diagnoses:  Biliary colic    ED Discharge Orders        Ordered    oxyCODONE-acetaminophen (PERCOCET/ROXICET) 5-325 MG tablet  Every 6 hours PRN     08/16/17 1958    ondansetron (ZOFRAN ODT) 4 MG disintegrating tablet  Every 8 hours PRN     08/16/17 1958       Cecille PoMacklin, Joanne Brander W, MD 08/16/17 2303    Eber HongMiller, Brian, MD 08/21/17 1130

## 2017-08-16 NOTE — ED Notes (Signed)
Pt called to provide UA sample/instructions; no reply at 13:10.

## 2017-08-17 ENCOUNTER — Ambulatory Visit (HOSPITAL_COMMUNITY)
Admission: RE | Admit: 2017-08-17 | Discharge: 2017-08-17 | Disposition: A | Discharge status: Home / Self Care | Payer: Medicare Other | Source: Ambulatory Visit | Attending: General Surgery | Admitting: General Surgery

## 2017-08-17 ENCOUNTER — Ambulatory Visit (HOSPITAL_COMMUNITY): Payer: Medicare Other | Admitting: Certified Registered Nurse Anesthetist

## 2017-08-17 ENCOUNTER — Encounter (HOSPITAL_COMMUNITY): Payer: Self-pay | Admitting: Certified Registered Nurse Anesthetist

## 2017-08-17 ENCOUNTER — Encounter (HOSPITAL_COMMUNITY)
Admission: RE | Disposition: A | Discharge status: Home / Self Care | Payer: Self-pay | Source: Ambulatory Visit | Attending: General Surgery

## 2017-08-17 ENCOUNTER — Ambulatory Visit (HOSPITAL_COMMUNITY): Payer: Medicare Other

## 2017-08-17 DIAGNOSIS — E7800 Pure hypercholesterolemia, unspecified: Secondary | ICD-10-CM | POA: Insufficient documentation

## 2017-08-17 DIAGNOSIS — Z888 Allergy status to other drugs, medicaments and biological substances status: Secondary | ICD-10-CM | POA: Insufficient documentation

## 2017-08-17 DIAGNOSIS — Z880 Allergy status to penicillin: Secondary | ICD-10-CM | POA: Diagnosis not present

## 2017-08-17 DIAGNOSIS — K219 Gastro-esophageal reflux disease without esophagitis: Secondary | ICD-10-CM | POA: Insufficient documentation

## 2017-08-17 DIAGNOSIS — I6930 Unspecified sequelae of cerebral infarction: Secondary | ICD-10-CM | POA: Diagnosis not present

## 2017-08-17 DIAGNOSIS — Z79899 Other long term (current) drug therapy: Secondary | ICD-10-CM | POA: Diagnosis not present

## 2017-08-17 DIAGNOSIS — K8010 Calculus of gallbladder with chronic cholecystitis without obstruction: Secondary | ICD-10-CM | POA: Diagnosis not present

## 2017-08-17 DIAGNOSIS — Z7982 Long term (current) use of aspirin: Secondary | ICD-10-CM | POA: Insufficient documentation

## 2017-08-17 DIAGNOSIS — M1990 Unspecified osteoarthritis, unspecified site: Secondary | ICD-10-CM | POA: Diagnosis not present

## 2017-08-17 DIAGNOSIS — I10 Essential (primary) hypertension: Secondary | ICD-10-CM | POA: Diagnosis not present

## 2017-08-17 DIAGNOSIS — K8020 Calculus of gallbladder without cholecystitis without obstruction: Secondary | ICD-10-CM | POA: Diagnosis present

## 2017-08-17 DIAGNOSIS — K8063 Calculus of gallbladder and bile duct with acute cholecystitis with obstruction: Secondary | ICD-10-CM

## 2017-08-17 HISTORY — PX: CHOLECYSTECTOMY: SHX55

## 2017-08-17 SURGERY — LAPAROSCOPIC CHOLECYSTECTOMY WITH INTRAOPERATIVE CHOLANGIOGRAM
Anesthesia: General | Site: Abdomen

## 2017-08-17 MED ORDER — DEXAMETHASONE SODIUM PHOSPHATE 10 MG/ML IJ SOLN
INTRAMUSCULAR | Status: DC | PRN
  Administered 2017-08-17: 8 mg via INTRAVENOUS

## 2017-08-17 MED ORDER — SODIUM CHLORIDE 0.9 % IV SOLN
INTRAVENOUS | Status: DC | PRN
  Administered 2017-08-17: 9 mL

## 2017-08-17 MED ORDER — ACETAMINOPHEN 500 MG PO TABS
1000.0000 mg | ORAL_TABLET | ORAL | Status: AC
  Administered 2017-08-17: 1000 mg via ORAL
  Filled 2017-08-17: qty 2

## 2017-08-17 MED ORDER — DEXAMETHASONE SODIUM PHOSPHATE 10 MG/ML IJ SOLN
INTRAMUSCULAR | Status: AC
  Filled 2017-08-17: qty 2

## 2017-08-17 MED ORDER — ROCURONIUM BROMIDE 10 MG/ML (PF) SYRINGE
PREFILLED_SYRINGE | INTRAVENOUS | Status: DC | PRN
  Administered 2017-08-17: 40 mg via INTRAVENOUS

## 2017-08-17 MED ORDER — BUPIVACAINE-EPINEPHRINE (PF) 0.25% -1:200000 IJ SOLN
INTRAMUSCULAR | Status: AC
  Filled 2017-08-17: qty 30

## 2017-08-17 MED ORDER — SODIUM CHLORIDE 0.9 % IR SOLN
Status: DC | PRN
  Administered 2017-08-17: 1000 mL

## 2017-08-17 MED ORDER — SODIUM CHLORIDE 0.9 % IJ SOLN
INTRAMUSCULAR | Status: AC
  Filled 2017-08-17: qty 10

## 2017-08-17 MED ORDER — ONDANSETRON HCL 4 MG/2ML IJ SOLN
INTRAMUSCULAR | Status: AC
  Filled 2017-08-17: qty 4

## 2017-08-17 MED ORDER — MIDAZOLAM HCL 5 MG/5ML IJ SOLN: INTRAMUSCULAR | Status: DC | PRN

## 2017-08-17 MED ORDER — GLYCOPYRROLATE PF 0.2 MG/ML IJ SOSY
PREFILLED_SYRINGE | INTRAMUSCULAR | Status: DC | PRN
  Administered 2017-08-17: .2 mg via INTRAVENOUS

## 2017-08-17 MED ORDER — CHLORHEXIDINE GLUCONATE CLOTH 2 % EX PADS: 6.0000 | MEDICATED_PAD | Freq: Once | CUTANEOUS | Status: DC

## 2017-08-17 MED ORDER — GLYCOPYRROLATE PF 0.2 MG/ML IJ SOSY
PREFILLED_SYRINGE | INTRAMUSCULAR | Status: AC
  Filled 2017-08-17: qty 1

## 2017-08-17 MED ORDER — PROPOFOL 10 MG/ML IV BOLUS
INTRAVENOUS | Status: AC
  Filled 2017-08-17: qty 20

## 2017-08-17 MED ORDER — OXYCODONE HCL 5 MG PO TABS
2.5000 mg | ORAL_TABLET | Freq: Four times a day (QID) | ORAL | 0 refills | Status: DC | PRN
Start: 2017-08-17 — End: 2018-02-21

## 2017-08-17 MED ORDER — BUPIVACAINE-EPINEPHRINE 0.25% -1:200000 IJ SOLN
INTRAMUSCULAR | Status: DC | PRN
  Administered 2017-08-17: 20 mL

## 2017-08-17 MED ORDER — FENTANYL CITRATE (PF) 100 MCG/2ML IJ SOLN: 25.0000 ug | INTRAMUSCULAR | Status: DC | PRN

## 2017-08-17 MED ORDER — SODIUM CHLORIDE 0.9 % IV SOLN
INTRAVENOUS | Status: DC | PRN
  Administered 2017-08-17: 25 ug/min via INTRAVENOUS

## 2017-08-17 MED ORDER — EPHEDRINE SULFATE 50 MG/ML IJ SOLN
INTRAMUSCULAR | Status: AC
  Filled 2017-08-17: qty 1

## 2017-08-17 MED ORDER — SUGAMMADEX SODIUM 200 MG/2ML IV SOLN
INTRAVENOUS | Status: DC | PRN
  Administered 2017-08-17: 50 mg via INTRAVENOUS
  Administered 2017-08-17: 200 mg via INTRAVENOUS

## 2017-08-17 MED ORDER — FENTANYL CITRATE (PF) 250 MCG/5ML IJ SOLN
INTRAMUSCULAR | Status: AC
  Filled 2017-08-17: qty 5

## 2017-08-17 MED ORDER — GABAPENTIN 300 MG PO CAPS
300.0000 mg | ORAL_CAPSULE | ORAL | Status: AC
  Administered 2017-08-17: 300 mg via ORAL
  Filled 2017-08-17: qty 1

## 2017-08-17 MED ORDER — LACTATED RINGERS IV SOLN
INTRAVENOUS | Status: DC
  Administered 2017-08-17: 10:00:00 via INTRAVENOUS

## 2017-08-17 MED ORDER — LIDOCAINE 2% (20 MG/ML) 5 ML SYRINGE
INTRAMUSCULAR | Status: DC | PRN
  Administered 2017-08-17: 50 mg via INTRAVENOUS

## 2017-08-17 MED ORDER — SUGAMMADEX SODIUM 200 MG/2ML IV SOLN
INTRAVENOUS | Status: AC
  Filled 2017-08-17: qty 4

## 2017-08-17 MED ORDER — MEPERIDINE HCL 50 MG/ML IJ SOLN: 6.2500 mg | INTRAMUSCULAR | Status: DC | PRN

## 2017-08-17 MED ORDER — 0.9 % SODIUM CHLORIDE (POUR BTL) OPTIME
TOPICAL | Status: DC | PRN
  Administered 2017-08-17: 1000 mL

## 2017-08-17 MED ORDER — CELECOXIB 200 MG PO CAPS
200.0000 mg | ORAL_CAPSULE | ORAL | Status: AC
  Administered 2017-08-17: 200 mg via ORAL
  Filled 2017-08-17: qty 1

## 2017-08-17 MED ORDER — ONDANSETRON HCL 4 MG/2ML IJ SOLN
INTRAMUSCULAR | Status: DC | PRN
  Administered 2017-08-17: 4 mg via INTRAVENOUS

## 2017-08-17 MED ORDER — IOPAMIDOL (ISOVUE-300) INJECTION 61%
INTRAVENOUS | Status: AC
  Filled 2017-08-17: qty 50

## 2017-08-17 MED ORDER — FENTANYL CITRATE (PF) 250 MCG/5ML IJ SOLN
INTRAMUSCULAR | Status: DC | PRN
  Administered 2017-08-17 (×2): 50 ug via INTRAVENOUS

## 2017-08-17 MED ORDER — PROPOFOL 10 MG/ML IV BOLUS
INTRAVENOUS | Status: DC | PRN
  Administered 2017-08-17: 100 mg via INTRAVENOUS

## 2017-08-17 MED ORDER — LIDOCAINE 2% (20 MG/ML) 5 ML SYRINGE
INTRAMUSCULAR | Status: AC
  Filled 2017-08-17: qty 5

## 2017-08-17 SURGICAL SUPPLY — 39 items
APPLIER CLIP 5 13 M/L LIGAMAX5 (MISCELLANEOUS) ×3
BLADE CLIPPER SURG (BLADE) IMPLANT
CANISTER SUCT 3000ML PPV (MISCELLANEOUS) ×3 IMPLANT
CHLORAPREP W/TINT 26ML (MISCELLANEOUS) ×3 IMPLANT
CLIP APPLIE 5 13 M/L LIGAMAX5 (MISCELLANEOUS) ×1 IMPLANT
CLOSURE WOUND 1/2 X4 (GAUZE/BANDAGES/DRESSINGS) ×1
COVER MAYO STAND STRL (DRAPES) IMPLANT
COVER SURGICAL LIGHT HANDLE (MISCELLANEOUS) ×3 IMPLANT
DERMABOND ADVANCED (GAUZE/BANDAGES/DRESSINGS) ×2
DERMABOND ADVANCED .7 DNX12 (GAUZE/BANDAGES/DRESSINGS) ×1 IMPLANT
DRAPE C-ARM 42X72 X-RAY (DRAPES) IMPLANT
DRSG TEGADERM 2-3/8X2-3/4 SM (GAUZE/BANDAGES/DRESSINGS) ×12 IMPLANT
ELECT REM PT RETURN 9FT ADLT (ELECTROSURGICAL) ×3
ELECTRODE REM PT RTRN 9FT ADLT (ELECTROSURGICAL) ×1 IMPLANT
GLOVE BIOGEL PI IND STRL 8 (GLOVE) ×1 IMPLANT
GLOVE BIOGEL PI INDICATOR 8 (GLOVE) ×2
GLOVE ECLIPSE 7.5 STRL STRAW (GLOVE) ×3 IMPLANT
GOWN STRL REUS W/ TWL LRG LVL3 (GOWN DISPOSABLE) ×3 IMPLANT
GOWN STRL REUS W/TWL LRG LVL3 (GOWN DISPOSABLE) ×6
KIT BASIN OR (CUSTOM PROCEDURE TRAY) ×3 IMPLANT
KIT TURNOVER KIT B (KITS) ×3 IMPLANT
NS IRRIG 1000ML POUR BTL (IV SOLUTION) ×3 IMPLANT
PAD ARMBOARD 7.5X6 YLW CONV (MISCELLANEOUS) ×3 IMPLANT
POUCH RETRIEVAL ECOSAC 10 (ENDOMECHANICALS) ×1 IMPLANT
POUCH RETRIEVAL ECOSAC 10MM (ENDOMECHANICALS) ×2
SCISSORS LAP 5X35 DISP (ENDOMECHANICALS) ×3 IMPLANT
SET CHOLANGIOGRAPH 5 50 .035 (SET/KITS/TRAYS/PACK) IMPLANT
SET IRRIG TUBING LAPAROSCOPIC (IRRIGATION / IRRIGATOR) ×3 IMPLANT
SLEEVE ENDOPATH XCEL 5M (ENDOMECHANICALS) ×6 IMPLANT
SPECIMEN JAR SMALL (MISCELLANEOUS) ×3 IMPLANT
STRIP CLOSURE SKIN 1/2X4 (GAUZE/BANDAGES/DRESSINGS) ×2 IMPLANT
SUT MNCRL AB 4-0 PS2 18 (SUTURE) ×3 IMPLANT
TOWEL OR 17X24 6PK STRL BLUE (TOWEL DISPOSABLE) ×3 IMPLANT
TOWEL OR 17X26 10 PK STRL BLUE (TOWEL DISPOSABLE) ×3 IMPLANT
TRAY LAPAROSCOPIC MC (CUSTOM PROCEDURE TRAY) ×3 IMPLANT
TROCAR XCEL BLUNT TIP 100MML (ENDOMECHANICALS) ×3 IMPLANT
TROCAR XCEL NON-BLD 5MMX100MML (ENDOMECHANICALS) ×3 IMPLANT
TUBING INSUFFLATION (TUBING) ×3 IMPLANT
WATER STERILE IRR 1000ML POUR (IV SOLUTION) ×3 IMPLANT

## 2017-08-17 NOTE — Transfer of Care (Signed)
Immediate Anesthesia Transfer of Care Note  Patient: [name]  Procedure(s) Performed: LAPAROSCOPIC CHOLECYSTECTOMY WITH INTRAOPERATIVE CHOLANGIOGRAM ERAS PATHWAY (N/A Abdomen)  Patient Location: PACU  Anesthesia Type:General  Level of Consciousness: awake, alert  and oriented  Airway & Oxygen Therapy: Patient Spontanous Breathing and Patient connected to nasal cannula oxygen  Post-op Assessment: Report given to RN, Post -op Vital signs reviewed and stable and Patient moving all extremities X 4  Post vital signs: Reviewed and stable  Last Vitals:  Vitals Value Taken Time  BP 111/44 08/17/2017  1:27 PM  Temp 36.1 C 08/17/2017  1:27 PM  Pulse 60 08/17/2017  1:33 PM  Resp 13 08/17/2017  1:33 PM  SpO2 100 % 08/17/2017  1:33 PM  Vitals shown include unvalidated device data.  Last Pain:  Vitals:   08/17/17 0837  TempSrc: Oral         Complications: No apparent anesthesia complications

## 2017-08-17 NOTE — Anesthesia Procedure Notes (Signed)
Procedure Name: Intubation Date/Time: 08/17/2017 12:13 PM Performed by: Mariea Clonts, CRNA Pre-anesthesia Checklist: Patient identified, Emergency Drugs available, Suction available and Patient being monitored Patient Re-evaluated:Patient Re-evaluated prior to induction Oxygen Delivery Method: Circle System Utilized Preoxygenation: Pre-oxygenation with 100% oxygen Induction Type: IV induction Ventilation: Mask ventilation without difficulty Laryngoscope Size: Mac and 3 Grade View: Grade II Tube type: Oral Tube size: 7.0 mm Number of attempts: 1 Airway Equipment and Method: Stylet and Oral airway Placement Confirmation: ETT inserted through vocal cords under direct vision,  positive ETCO2 and breath sounds checked- equal and bilateral Tube secured with: Tape Dental Injury: Teeth and Oropharynx as per pre-operative assessment

## 2017-08-17 NOTE — Anesthesia Preprocedure Evaluation (Signed)
Anesthesia Evaluation  Patient identified by MRN, date of birth, ID band Patient awake    Reviewed: Allergy & Precautions, NPO status , Patient's Chart, lab work & pertinent test results  History of Anesthesia Complications (+) PONV and history of anesthetic complications  Airway Mallampati: II  TM Distance: >3 FB Neck ROM: Full    Dental no notable dental hx.    Pulmonary    Pulmonary exam normal breath sounds clear to auscultation       Cardiovascular hypertension, Pt. on medications and Pt. on home beta blockers Normal cardiovascular exam Rhythm:Regular Rate:Normal     Neuro/Psych CVA, Residual Symptoms    GI/Hepatic GERD  Medicated,  Endo/Other    Renal/GU      Musculoskeletal  (+) Arthritis , Osteoarthritis,    Abdominal   Peds  Hematology   Anesthesia Other Findings   Reproductive/Obstetrics                             Anesthesia Physical  Anesthesia Plan  ASA: III  Anesthesia Plan: General   Post-op Pain Management:    Induction: Intravenous  PONV Risk Score and Plan: 4 or greater and Ondansetron, Treatment may vary due to age or medical condition and Dexamethasone  Airway Management Planned: Oral ETT  Additional Equipment:   Intra-op Plan:   Post-operative Plan: Extubation in OR  Informed Consent: I have reviewed the patients History and Physical, chart, labs and discussed the procedure including the risks, benefits and alternatives for the proposed anesthesia with the patient or authorized representative who has indicated his/her understanding and acceptance.   Dental advisory given  Plan Discussed with: CRNA, Surgeon and Anesthesiologist  Anesthesia Plan Comments: (  )        Anesthesia Quick Evaluation

## 2017-08-17 NOTE — Discharge Instructions (Addendum)
Laparoscopic Cholecystectomy, Care After This sheet gives you information about how to care for yourself after your procedure. Your doctor may also give you more specific instructions. If you have problems or questions, contact your doctor. Follow these instructions at home: Care for cuts from surgery (incisions)   Follow instructions from your doctor about how to take care of your cuts from surgery. Make sure you: ? Wash your hands with soap and water before you change your bandage (dressing). If you cannot use soap and water, use hand sanitizer. ? Change your bandage as told by your doctor. ? Leave stitches (sutures), skin glue, or skin tape (adhesive) strips in place. They may need to stay in place for 2 weeks or longer. If tape strips get loose and curl up, you may trim the loose edges. Do not remove tape strips completely unless your doctor says it is okay.  Do not take baths, swim, or use a hot tub until your doctor says it is okay. Ask your doctor if you can take showers. You may only be allowed to take sponge baths for bathing.  Check your surgical cut area every day for signs of infection. Check for: ? More redness, swelling, or pain. ? More fluid or blood. ? Warmth. ? Pus or a bad smell. Activity  Do not drive or use heavy machinery while taking prescription pain medicine.  Do not lift anything that is heavier than 10 lb (4.5 kg) until your doctor says it is okay.  Do not play contact sports until your doctor says it is okay.  Do not drive for 24 hours if you were given a medicine to help you relax (sedative).  Rest as needed. Do not return to work or school until your doctor says it is okay. General instructions  Take over-the-counter and prescription medicines only as told by your doctor.  To prevent or treat constipation while you are taking prescription pain medicine, your doctor may recommend that you: ? Drink enough fluid to keep your pee (urine) clear or pale  yellow. ? Take over-the-counter or prescription medicines. ? Eat foods that are high in fiber, such as fresh fruits and vegetables, whole grains, and beans. ? Limit foods that are high in fat and processed sugars, such as fried and sweet foods. Contact a doctor if:  You develop a rash.  You have more redness, swelling, or pain around your surgical cuts.  You have more fluid or blood coming from your surgical cuts.  Your surgical cuts feel warm to the touch.  You have pus or a bad smell coming from your surgical cuts.  You have a fever.  One or more of your surgical cuts breaks open. Get help right away if:  You have trouble breathing.  You have chest pain.  You have pain that is getting worse in your shoulders.  You faint or feel dizzy when you stand.  You have very bad pain in your belly (abdomen).  You are sick to your stomach (nauseous) for more than one day.  You have throwing up (vomiting) that lasts for more than one day.  You have leg pain. This information is not intended to replace advice given to you by your health care provider. Make sure you discuss any questions you have with your health care provider.  Marta Lamas. Gae Bon, MD, FACS [phone] Surgery   Post Anesthesia Home Care Instructions  Activity: Get plenty of rest for the remainder of the day. A  responsible individual must stay with you for 24 hours following the procedure.  For the next 24 hours, DO NOT: -Drive a car -Advertising copywriterperate machinery -Drink alcoholic beverages -Take any medication unless instructed by your physician -Make any legal decisions or sign important papers.  Meals: Start with liquid foods such as gelatin or soup. Progress to regular foods as tolerated. Avoid greasy, spicy, heavy foods. If nausea and/or vomiting occur, drink only clear liquids until the nausea and/or vomiting subsides. Call your physician if vomiting  continues.  Special Instructions/Symptoms: Your throat may feel dry or sore from the anesthesia or the breathing tube placed in your throat during surgery. If this causes discomfort, gargle with warm salt water. The discomfort should disappear within 24 hours.  If you had a scopolamine patch placed behind your ear for the management of post- operative nausea and/or vomiting:  1. The medication in the patch is effective for 72 hours, after which it should be removed.  Wrap patch in a tissue and discard in the trash. Wash hands thoroughly with soap and water. 2. You may remove the patch earlier than 72 hours if you experience unpleasant side effects which may include dry mouth, dizziness or visual disturbances. 3. Avoid touching the patch. Wash your hands with soap and water after contact with the patch.

## 2017-08-17 NOTE — Op Note (Addendum)
OPERATIVE REPORT  DATE OF OPERATION: 08/17/2017  PATIENT:  [name]  84 y.o. female  PRE-OPERATIVE DIAGNOSIS:  Symptomatic cholelithiasis  POST-OPERATIVE DIAGNOSIS:  Symptomatic cholelithiasis  INDICATION(S) FOR OPERATION:  Patient with RUQ pain and tenderness from gallstones  FINDINGS:  Normal IOC.  Chronic inflammation.  PROCEDURE:  Procedure(s): LAPAROSCOPIC CHOLECYSTECTOMY WITH INTRAOPERATIVE CHOLANGIOGRAM ERAS PATHWAY  SURGEON:  Surgeon(s): Jimmye Norman, MD Berna Bue, MD  ASSISTANT: Fredricka Bonine, MD  ANESTHESIA:   general  COMPLICATIONS:  None  EBL: 10 ml  BLOOD ADMINISTERED: none  DRAINS: none   SPECIMEN:  Source of Specimen:  Gallbladder and contents  COUNTS CORRECT:  YES  PROCEDURE DETAILS: The patient was taken to the operating room and placed on the table in the supine position.  After an adequate endotracheal anesthetic was administered, the patient was prepped with ChloroPrep, and then draped in the usual manner exposing the entire abdomen laterally, inferiorly and up  to the costal margins.  After a proper timeout was performed including identifying the patient and the procedure to be performed, a supraumbilical 1.5cm midline incision was made using a #15 blade.  This was taken down to the fascia which was then incised with a #15 blade.  The edges of the fascia were tented up with Kocher clamps as the preperitoneal space was penetrated with a Kelly clamp into the peritoneum.  Once this was done, a pursestring suture of 0 Vicryl was passed around the fascial opening.  This was subsequently used to secure the [facility] cannula which was passed into the peritoneal cavity.  Once the [facility] cannula was in place, carbon dioxide gas was insufflated into the peritoneal cavity up to a maximal intra-abdominal pressure of 15mm Hg.The laparoscope, with attached camera and light source, was passed into the peritoneal cavity to visualize the direct insertion of two right  upper quadrant 5mm cannulas, and a sup-xiphoid 5mm cannula.  Once all cannulas were in place, the dissection was begun.  Two ratcheted graspers were attached to the dome and infundibulum of the gallbladder and retracted towards the anterior abdominal wall and the right upper quadrant.  Using cautery attached to a dissecting forceps, the peritoneum overlaying the triangle of Chalot and the hepatoduodenal triangle was dissected away exposing the cystic duct and the cystic artery.  A critical window was developed between the CBD and the cystic duct The cystic artery was clipped proximally and distally then transected.  A clip was placed on the gallbladder side of the cystic duct, then a cholecystodochotomy made using the laparoscopic scissors.  Through the cholecystodochotomy a Cook catheter was passed to performed a cholangiogram.  The cholangiogram showed good flow into the duodenum, good proximal filling, no intraductal filling defect. And not ductal dilatation..  Once the cholangiogram was completed, the [facility] catheter was removed, and the distal cystic duct was clipped multiple times then transected between the clips.  The gallbladder was then dissected out of the hepatic bed without event.  It was retrieved from the abdomen using an EcoPouch bag without event.  Once the gallbladder was removed, the bed was inspected for hemostasis.  Once excellent hemostasis was obtained all gas and fluids were aspirated from above the liver, then the cannulas were removed.  The supraumbilical incision was closed using the pursestring suture which was in place.  0.25% bupivicaine with epinephrine was injected at all sites.  All 10mm or greater cannula sites were close using a running subcuticular stitch of 4-0 Monocryl.  5.2mm cannula sites were  closed with Dermabond only.Steri-Strips and Tagaderm were used to complete the dressings at all sites.  At this point all needle, sponge, and instrument counts were correct.The  patient was awakened from anesthesia and taken to the PACU in stable condition.      Marta LamasJames O. Gae BonWyatt, III, MD, FACS [phone]--pager [phone]--office [facility]  PATIENT DISPOSITION:  PACU - hemodynamically stable.   Jimmye NormanJames Pierre Dellarocco 7/19/20191:11 PM

## 2017-08-17 NOTE — H&P (Signed)
[name] Documented: 07/23/2017 9:51 AM Location: [facility] Patient #: [id number] DOB: 04/24/1933 Single / Language: [place] / Race: White Female   History of Present Illness [name]. Kristen Eckert MD; 07/23/2017 10:30 AM) Patient words: I am having problems with my gallbladder. Started before 2017, had a CVA, postponed surgery.  The patient is a 84 year old female who presents for evaluation of gall stones. The onset of the gall stones has been gradual and has been occurring in an intermittent pattern for 2 years. The course has been without change. The gall stones is described as moderate. There has been associated abdominal pain and belching.   Past Surgical History (Tanisha A. Manson Passey, RMA; 07/23/2017 9:51 AM) Hip Surgery  Bilateral.  Diagnostic Studies History (Tanisha A. Manson Passey, RMA; 07/23/2017 9:51 AM) Colonoscopy  never Pap Smear  never  Allergies (Tanisha A. Manson Passey, RMA; 07/23/2017 9:53 AM) Amoxicillin *PENICILLINS*  Allergies Reconciled   Medication History (Tanisha A. Manson Passey, RMA; 07/23/2017 9:54 AM) Multi-Vitamin (Oral) Active. Calcium-Vitamin D (Oral) Active. Fish Oil (Oral) Specific strength unknown - Active. AmLODIPine Besylate (10MG  Tablet, Oral) Active. Atenolol (50MG  Tablet, Oral) Active. Pantoprazole Sodium (40MG  Tablet DR, Oral) Active. Rosuvastatin Calcium (5MG  Tablet, Oral) Active. Latanoprost (0.005% Solution, Ophthalmic) Active. Aspirin (325MG  Tablet, Oral) Active. Medications Reconciled  Social History (Tanisha A. Manson Passey, RMA; 07/23/2017 9:51 AM) Caffeine use  Carbonated beverages, Coffee. No alcohol use  Tobacco use  Never smoker.  Family History (Tanisha A. Manson Passey, RMA; 07/23/2017 9:51 AM) Alcohol Abuse  Brother, Family Members In General. Arthritis  Brother, Father. Heart Disease  Father. Heart disease in female family member before age 19  Hypertension  Brother, Family Members In Faith, Father, Mother.  Pregnancy / Birth  History (Tanisha A. Manson Passey, RMA; 07/23/2017 9:51 AM) Age at menarche  15 years. Age of menopause  11-50 Gravida  1 Maternal age  2-25 Para  1  Other Problems (Tanisha A. Manson Passey, RMA; 07/23/2017 9:51 AM) Arthritis  Cholelithiasis  Gastroesophageal Reflux Disease  High blood pressure  Hypercholesterolemia     Review of Systems (Tanisha A. Brown RMA; 07/23/2017 9:51 AM) Skin Present- Dryness. Not Present- Change in Wart/Mole, Hives, Jaundice, New Lesions, Non-Healing Wounds, Rash and Ulcer. HEENT Present- Wears glasses/contact lenses. Not Present- Earache, Hearing Loss, Hoarseness, Nose Bleed, Oral Ulcers, Ringing in the Ears, Seasonal Allergies, Sinus Pain, Sore Throat, Visual Disturbances and Yellow Eyes. Respiratory Not Present- Bloody sputum, Chronic Cough, Difficulty Breathing, Snoring and Wheezing. Cardiovascular Present- Swelling of Extremities. Not Present- Chest Pain, Difficulty Breathing Lying Down, Leg Cramps, Palpitations, Rapid Heart Rate and Shortness of Breath. Gastrointestinal Present- Abdominal Pain. Not Present- Bloating, Bloody Stool, Change in Bowel Habits, Chronic diarrhea, Constipation, Difficulty Swallowing, Excessive gas, Gets full quickly at meals, Hemorrhoids, Indigestion, Nausea, Rectal Pain and Vomiting. Neurological Present- Weakness. Not Present- Decreased Memory, Fainting, Headaches, Numbness, Seizures, Tingling, Tremor and Trouble walking. Psychiatric Present- Change in Sleep Pattern. Not Present- Anxiety, Bipolar, Depression, Fearful and Frequent crying. Endocrine Present- Hot flashes. Not Present- Cold Intolerance, Excessive Hunger, Hair Changes, Heat Intolerance and New Diabetes.  Vitals (Tanisha A. Brown RMA; 07/23/2017 9:52 AM) 07/23/2017 9:52 AM Weight: 108.6 lb Height: 60in Body Surface Area: 1.44 m Body Mass Index: 21.21 kg/m  Temp.: 97.38F  Pulse: 68 (Regular)  BP: 110/62 (Sitting, Left Arm, Standard)  BP today. 156/43 P  55   Physical Exam (Jiles Goya O. Lindie Spruce MD; 07/23/2017 10:32 AM) General Mental Status-Alert. General Appearance-Anxious and Well groomed. Orientation-Oriented X4. Build & Nutrition-Petite. Posture-Normal posture.  Chest  and Lung Exam Chest and lung exam reveals -quiet, even and easy respiratory effort with no use of accessory muscles, non-tender and normal tactile fremitus and on auscultation, normal breath sounds, no adventitious sounds and normal vocal resonance.  Cardiovascular Cardiovascular examination reveals -normal heart sounds, regular rate and rhythm with no murmurs and (see Vital Signs section for blood pressure measurements).  Abdomen Palpation/Percussion Tenderness - Right Upper Quadrant(Mild tenderness, + Murphy's sign). Note: No rebound our guarding. No previous surgical scars. Still mildly tender in the RUQ.    Assessment & Plan Fayrene Fearing(Wofford Stratton O. Jehiel Koepp MD; 07/23/2017 10:33 AM) SYMPTOMATIC CHOLELITHIASIS (K80.20) Story: Has had diagnosis for a couple of years. Now with frequent intermittent symmptoms. Impression: Needs cholecystectomy with IOC. No need for cardiac clearance. CVA in the past may have been from severe complications of a medical condition. Current Plans: Laparoscopic cholecystectomy, possible IOC

## 2017-08-17 NOTE — Anesthesia Postprocedure Evaluation (Signed)
Anesthesia Post Note  Patient: [name]  Procedure(s) Performed: LAPAROSCOPIC CHOLECYSTECTOMY WITH INTRAOPERATIVE CHOLANGIOGRAM ERAS PATHWAY (N/A Abdomen)     Patient location during evaluation: PACU Anesthesia Type: General Level of consciousness: awake and alert Pain management: pain level controlled Vital Signs Assessment: post-procedure vital signs reviewed and stable Respiratory status: spontaneous breathing, nonlabored ventilation, respiratory function stable and patient connected to nasal cannula oxygen Cardiovascular status: blood pressure returned to baseline and stable Postop Assessment: no apparent nausea or vomiting Anesthetic complications: no    Last Vitals:  Vitals:   08/17/17 1345 08/17/17 1401  BP: (!) 97/39 (!) 96/52  Pulse: (!) 58 60  Resp: 12 19  Temp:  (!) 36.2 C  SpO2: 97% 99%    Last Pain:  Vitals:   08/17/17 0837  TempSrc: Oral                 Rasean Joos

## 2017-08-18 ENCOUNTER — Encounter (HOSPITAL_COMMUNITY): Payer: Self-pay | Admitting: General Surgery

## 2017-11-30 ENCOUNTER — Emergency Department (HOSPITAL_COMMUNITY)
Admission: EM | Admit: 2017-11-30 | Discharge: 2017-11-30 | Disposition: A | Discharge status: Home / Self Care | Payer: Medicare Other | Attending: Emergency Medicine | Admitting: Emergency Medicine

## 2017-11-30 ENCOUNTER — Emergency Department (HOSPITAL_COMMUNITY): Payer: Medicare Other

## 2017-11-30 ENCOUNTER — Encounter (HOSPITAL_COMMUNITY): Payer: Self-pay | Admitting: Emergency Medicine

## 2017-11-30 DIAGNOSIS — X58XXXA Exposure to other specified factors, initial encounter: Secondary | ICD-10-CM | POA: Insufficient documentation

## 2017-11-30 DIAGNOSIS — S73005A Unspecified dislocation of left hip, initial encounter: Secondary | ICD-10-CM

## 2017-11-30 DIAGNOSIS — Z7982 Long term (current) use of aspirin: Secondary | ICD-10-CM | POA: Diagnosis not present

## 2017-11-30 DIAGNOSIS — Y999 Unspecified external cause status: Secondary | ICD-10-CM | POA: Insufficient documentation

## 2017-11-30 DIAGNOSIS — R52 Pain, unspecified: Secondary | ICD-10-CM

## 2017-11-30 DIAGNOSIS — Y939 Activity, unspecified: Secondary | ICD-10-CM | POA: Insufficient documentation

## 2017-11-30 DIAGNOSIS — Z79899 Other long term (current) drug therapy: Secondary | ICD-10-CM | POA: Insufficient documentation

## 2017-11-30 DIAGNOSIS — S73004A Unspecified dislocation of right hip, initial encounter: Secondary | ICD-10-CM | POA: Insufficient documentation

## 2017-11-30 DIAGNOSIS — Y929 Unspecified place or not applicable: Secondary | ICD-10-CM | POA: Insufficient documentation

## 2017-11-30 DIAGNOSIS — I10 Essential (primary) hypertension: Secondary | ICD-10-CM | POA: Diagnosis not present

## 2017-11-30 LAB — CBC WITH DIFFERENTIAL/PLATELET
ABS IMMATURE GRANULOCYTES: 0.41 10*3/uL — AB (ref 0.00–0.07)
BASOS ABS: 0 10*3/uL (ref 0.0–0.1)
BASOS PCT: 0 %
EOS ABS: 0 10*3/uL (ref 0.0–0.5)
Eosinophils Relative: 1 %
HCT: 39.4 % (ref 36.0–46.0)
Hemoglobin: 13 g/dL (ref 12.0–15.0)
Immature Granulocytes: 5 %
Lymphocytes Relative: 23 %
Lymphs Abs: 1.8 10*3/uL (ref 0.7–4.0)
MCH: 29.1 pg (ref 26.0–34.0)
MCHC: 33 g/dL (ref 30.0–36.0)
MCV: 88.1 fL (ref 80.0–100.0)
MONOS PCT: 13 %
Monocytes Absolute: 1 10*3/uL (ref 0.1–1.0)
NEUTROS ABS: 4.5 10*3/uL (ref 1.7–7.7)
NRBC: 0 % (ref 0.0–0.2)
Neutrophils Relative %: 58 %
PLATELETS: 207 10*3/uL (ref 150–400)
RBC: 4.47 MIL/uL (ref 3.87–5.11)
RDW: 11.7 % (ref 11.5–15.5)
WBC: 7.8 10*3/uL (ref 4.0–10.5)

## 2017-11-30 LAB — COMPREHENSIVE METABOLIC PANEL
ALT: 21 U/L (ref 0–44)
AST: 46 U/L — AB (ref 15–41)
Albumin: 4.3 g/dL (ref 3.5–5.0)
Alkaline Phosphatase: 53 U/L (ref 38–126)
Anion gap: 10 (ref 5–15)
BUN: 14 mg/dL (ref 8–23)
CHLORIDE: 103 mmol/L (ref 98–111)
CO2: 23 mmol/L (ref 22–32)
CREATININE: 0.83 mg/dL (ref 0.44–1.00)
Calcium: 9.4 mg/dL (ref 8.9–10.3)
GFR calc Af Amer: >60 mL/min (ref >60)
GLUCOSE: 168 mg/dL — AB (ref 70–99)
POTASSIUM: 3.6 mmol/L (ref 3.5–5.1)
SODIUM: 136 mmol/L (ref 135–145)
TOTAL PROTEIN: 6.5 g/dL (ref 6.5–8.1)
Total Bilirubin: 1.2 mg/dL (ref 0.3–1.2)

## 2017-11-30 MED ORDER — FENTANYL CITRATE (PF) 100 MCG/2ML IJ SOLN
50.0000 ug | Freq: Once | INTRAMUSCULAR | Status: AC
  Administered 2017-11-30: 50 ug via INTRAVENOUS
  Filled 2017-11-30: qty 2

## 2017-11-30 MED ORDER — PROPOFOL 1000 MG/100ML IV EMUL
INTRAVENOUS | Status: AC
  Filled 2017-11-30: qty 100

## 2017-11-30 MED ORDER — PROPOFOL 10 MG/ML IV BOLUS: 0.5000 mg/kg | Freq: Once | INTRAVENOUS | Status: DC

## 2017-11-30 MED ORDER — ONDANSETRON HCL 4 MG/2ML IJ SOLN
4.0000 mg | Freq: Once | INTRAMUSCULAR | Status: AC
  Administered 2017-11-30: 4 mg via INTRAVENOUS
  Filled 2017-11-30: qty 2

## 2017-11-30 MED ORDER — KETAMINE HCL 50 MG/5ML IJ SOSY
1.0000 mg/kg | PREFILLED_SYRINGE | Freq: Once | INTRAMUSCULAR | Status: DC
  Filled 2017-11-30: qty 5

## 2017-11-30 NOTE — ED Notes (Signed)
Pain with any movement

## 2017-11-30 NOTE — ED Provider Notes (Signed)
.Sedation Date/Time: 11/30/2017 3:33 PM Performed by: Keith Rake, MD Authorized by: Keith Rake, MD   Consent:    Consent obtained:  Verbal   Consent given by:  Patient   Risks discussed:  Allergic reaction, dysrhythmia, inadequate sedation, nausea, vomiting, respiratory compromise necessitating ventilatory assistance and intubation, prolonged sedation necessitating reversal and prolonged hypoxia resulting in organ damage Universal protocol:    Procedure explained and questions answered to patient or proxy's satisfaction: yes     Relevant documents present and verified: yes     Imaging studies available: yes     Immediately prior to procedure a time out was called: yes   Indications:    Procedure performed:  Dislocation reduction   Procedure necessitating sedation performed by:  Different physician Pre-sedation assessment:    Time since last food or drink:  8 hours   ASA classification: class 2 - patient with mild systemic disease     Neck mobility: normal     Mouth opening:  3 or more finger widths   Thyromental distance:  2 finger widths   Mallampati score:  III - soft palate, base of uvula visible   Pre-sedation assessments completed and reviewed: airway patency, cardiovascular function, hydration status, mental status, nausea/vomiting, pain level, respiratory function and temperature     Pre-sedation assessment completed:  11/30/2017 3:36 PM Immediate pre-procedure details:    Reassessment: Patient reassessed immediately prior to procedure     Reviewed: vital signs and NPO status     Verified: bag valve mask available, emergency equipment available, intubation equipment available, IV patency confirmed, oxygen available and suction available   Procedure details (see MAR for exact dosages):    Preoxygenation:  Nasal cannula   Sedation:  Propofol   Analgesia:  None   Intra-procedure monitoring:  Blood pressure monitoring, cardiac monitor, continuous pulse oximetry,  frequent LOC assessments and frequent vital sign checks   Intra-procedure events: none     Total Provider sedation time (minutes):  10 Post-procedure details:    Post-sedation assessment completed:  11/30/2017 5:00 PM   Attendance: Constant attendance by certified staff until patient recovered     Recovery: Patient returned to pre-procedure baseline     Post-sedation assessments completed and reviewed: airway patency, cardiovascular function, hydration status, mental status, nausea/vomiting, pain level and respiratory function     Patient is stable for discharge or admission: yes     Patient tolerance:  Tolerated well, no immediate complications  Reduction of dislocation Date/Time: 11/30/2017 11:45 PM Performed by: Keith Rake, MD Authorized by: Keith Rake, MD  Consent: Verbal consent obtained. Consent given by: patient Patient understanding: patient states understanding of the procedure being performed Patient consent: the patient's understanding of the procedure matches consent given Test results: test results available and properly labeled Imaging studies: imaging studies available Patient identity confirmed: verbally with patient Local anesthesia used: no  Anesthesia: Local anesthesia used: no  Sedation: Patient sedated: yes Sedation type: moderate (conscious) sedation Sedatives: propofol  Patient tolerance: Patient tolerated the procedure well with no immediate complications    Patient is an 84 year old female who presented to the emergency department secondary to a left hip dislocation.  Patient's dislocation was confirmed with imaging.  Patient's hip was reduced at bedside under sedation.  She tolerated procedure well without any immediate complications.  Following the procedure patient was able to ambulate in the emergency department.  She has her daughter here with her who will be able to help her for the  next few days as she recovers.  As a result patient is  appropriate for discharge at this time.  Patient instructed to follow-up with her primary care physician early next week and her orthopedic surgeon in the next week.  Acute distress at time of discharge.   [name], Winfield Rast, MD 11/30/17 1610    Arby Barrette, MD 12/06/17 1531

## 2017-11-30 NOTE — Sedation Documentation (Signed)
intital c02 monitor not working  Pt did ok on regular pulse ox  With resp therapy at the bedsode

## 2017-11-30 NOTE — ED Provider Notes (Signed)
[facility] EMERGENCY DEPARTMENT Provider Note   CSN: [id number] Arrival date & time: 11/30/17  1205     History   Chief Complaint Chief Complaint  Patient presents with  . Hip Injury    HPI [name] is a 84 y.o. female.  Who presents the emergency department stating "my hip is out."  Patient states she has a history of previous right hip dislocation.  She felt her left hip dislocate today when she was trying to pull her leg out of her pants.  She complains of pain with any movement of the leg.  Is a history of previous CVA with some reduced sensation on the left side but states it is still quite painful.  Denies any change in her normal sensation.  HPI  Past Medical History:  Diagnosis Date  . Acute ischemic stroke (HCC) 12/28/2015   [name] 12/28/2015  . Arthritis    "hands" (12/28/2015)  . GERD (gastroesophageal reflux disease)   . High cholesterol   . Hypertension   . PONV (postoperative nausea and vomiting)    "w/1st hip replacement"  . Symptomatic cholelithiasis     Patient Active Problem List   Diagnosis Date Noted  . Hyperlipidemia 08/28/2016  . AKI (acute kidney injury) (HCC)   . Frequent stools   . Sleep disturbance   . Hemiparesis of right nondominant side due to cerebral infarction   . Benign essential HTN   . 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 Ataxia, late effect of cerebrovascular disease   . Alteration of sensation as late effect of stroke   . CVA (cerebral vascular accident) (HCC) 12/29/2015  . Left sided numbness 12/28/2015  . Nausea & vomiting 12/28/2015  . Diarrhea 12/28/2015  . HTN (hypertension) 12/28/2015  . Acute encephalopathy 12/23/2015  . UTI (urinary tract infection) 12/23/2015  . Hyponatremia 12/23/2015  . Dehydration with hyponatremia 12/23/2015    Past Surgical History:  Procedure Laterality Date  . CATARACT EXTRACTION W/ INTRAOCULAR LENS IMPLANT Right ~ 2011  . CHOLECYSTECTOMY N/A 08/17/2017   Procedure: LAPAROSCOPIC CHOLECYSTECTOMY WITH INTRAOPERATIVE CHOLANGIOGRAM ERAS PATHWAY;  Surgeon: Jimmye Norman, MD;  Location[facility];  Service: General;  Laterality: N/A;  . HIP CLOSED REDUCTION Right 09/29/2016   Procedure: CLOSED REDUCTION RIGHT HIP;  Surgeon: Samson Frederic, MD;  Location: [facility] OR;  Service: Orthopedics;  Laterality: Right;  . JOINT REPLACEMENT    . TOTAL HIP ARTHROPLASTY  2007-2008   left-right     OB History   None      Home Medications    Prior to Admission medications   Medication Sig Start Date End Date Taking? Authorizing Provider  amLODipine (NORVASC) 10 MG tablet Take 1 tablet (10 mg total) by mouth daily. 01/06/16   Angiulli, Mcarthur Rossetti, PA-C  aspirin 325 MG tablet Take 1 tablet (325 mg total) by mouth daily. 12/30/15   Joseph Art, DO  atenolol (TENORMIN) 50 MG tablet Take 1 tablet (50 mg total) by mouth daily. 01/06/16   Angiulli, Mcarthur Rossetti, PA-C  Calcium Carb-Cholecalciferol (CALCIUM 600+D3 PO) Take 1 tablet by mouth 2 (two) times daily.    [provider]  latanoprost (XALATAN) 0.005 % ophthalmic solution Place 1 drop into both eyes at bedtime.    [provider]  Multiple Vitamin (MULTI-VITAMINS) TABS Take 1 tablet by mouth daily.     [provider]  Omega-3 Fatty Acids (FISH OIL PO) Take 1 tablet by mouth daily.    [provider]  ondansetron (ZOFRAN ODT) 4 MG disintegrating tablet Take 1 tablet (4 mg total) by mouth every 8 (eight) hours as needed for nausea or vomiting. 08/16/17   Cecille Po, MD  oxyCODONE (OXY IR/ROXICODONE) 5 MG immediate release tablet Take 0.5 tablets (2.5 mg total) by mouth every 6 (six) hours as needed for severe pain. 08/17/17   Jimmye Norman, MD  oxyCODONE-acetaminophen (PERCOCET/ROXICET) 5-325 MG tablet Take 0.5 tablets by mouth every 6 (six) hours as needed for severe pain. 08/16/17   Cecille Po, MD  pantoprazole (PROTONIX) 40 MG tablet Take 1 tablet (40 mg total) by mouth  daily. 01/06/16   Angiulli, Mcarthur Rossetti, PA-C  rosuvastatin (CRESTOR) 5 MG tablet Take 1 tablet (5 mg total) by mouth daily at 6 PM. 01/06/16   Angiulli, Mcarthur Rossetti, PA-C  traMADol (ULTRAM) 50 MG tablet Take 50 mg by mouth every 12 (twelve) hours as needed. for pain 07/20/17   [provider]    Family History Family History  Problem Relation Age of Onset  . CAD Father   . Stroke Father   . Alcohol abuse Brother   . CAD Brother   . Aneurysm Brother     Social History Social History   Tobacco Use  . Smoking status: Never Smoker  . Smokeless tobacco: Never Used  Substance Use Topics  . Alcohol use: No  . Drug use: No     Allergies   Ativan [lorazepam]; Glucosamine sulfate; Niacin; Phenergan [promethazine]; and Amoxicillin   Review of Systems Review of Systems Ten systems reviewed and are negative for acute change, except as noted in the HPI.    Physical Exam Updated Vital Signs BP (!) 141/57   Pulse 79   SpO2 99%   Physical Exam  Constitutional: She is oriented to person, place, and time. She appears well-developed and well-nourished. No distress.  HENT:  Head: Normocephalic and atraumatic.  Eyes: Conjunctivae are normal. No scleral icterus.  Neck: Normal range of motion.  Cardiovascular: Normal rate, regular rhythm and normal heart sounds. Exam reveals no gallop and no friction rub.  No murmur heard. Pulmonary/Chest: Effort normal and breath sounds normal. No respiratory distress.  Abdominal: Soft. Bowel sounds are normal. She exhibits no distension and no mass. There is no tenderness. There is no guarding.  Musculoskeletal:  left leg shortened and internally rotated  Neurological: She is alert and oriented to person, place, and time.  Skin: Skin is warm and dry. She is not diaphoretic.  Psychiatric: Her behavior is normal.  Nursing note and vitals reviewed.    ED Treatments / Results  Labs (all labs ordered are listed, but only abnormal results are  displayed) Labs Reviewed  CBC WITH DIFFERENTIAL/PLATELET  COMPREHENSIVE METABOLIC PANEL    EKG None  Radiology No results found.  Procedures Procedures (including critical care time)  Medications Ordered in ED Medications - No data to display   Initial Impression / Assessment and Plan / ED Course  I have reviewed the triage vital signs and the nursing notes.  Pertinent labs & imaging results that were available during my care of the patient were reviewed by me and considered in my medical decision making (see chart for details).     With posterior hip dislocation.  I have given sign out to Dr. Otho Najjar Dr. Clarice Pole who will assume care of the patient for reduction. Final Clinical Impressions(s) / ED Diagnoses   Final diagnoses:  None    ED Discharge Orders    None  Arthor Captain, PA-C 11/30/17 1608    Tilden Fossa, MD 12/02/17 [phone]

## 2017-11-30 NOTE — ED Notes (Signed)
Propofol 40mg  given iv  Then 20 mg more  In one mionute.  The epic computer did not capture any of the medicines during the procedure

## 2017-11-30 NOTE — ED Provider Notes (Signed)
Patient reports that she had just picked her left leg up to move and take off her pants and it popped out the joint.  She reports is very painful for any kind of movement.  Patient was seen by Dr. Pecola Leisure and Manuela Neptune.  Will assume care for sedation and reduction.  Full review of presedation physical exam and history taken by myself as well as my resident for documentation.  Patient is alert and clinically well in appearance.  Widely patent posterior airway with good visualization.  No respiratory distress.  Heart is regular.  Lungs are clear. Physical Exam  BP (!) 111/52   Pulse 75   Temp 98.2 F (36.8 C) (Oral)   Resp 18   SpO2 99%   Physical Exam Patient has internally rotated left lower extremity with 2+ dorsalis pedis pulse. ED Course/Procedures     .Sedation Date/Time: 11/30/2017 4:39 PM Performed by: Arby Barrette, MD Authorized by: Arby Barrette, MD   Consent:    Consent obtained:  Verbal   Consent given by:  Patient   Risks discussed:  Allergic reaction, dysrhythmia, inadequate sedation, nausea, prolonged hypoxia resulting in organ damage, prolonged sedation necessitating reversal, respiratory compromise necessitating ventilatory assistance and intubation and vomiting   Alternatives discussed:  Analgesia without sedation, anxiolysis and regional anesthesia Universal protocol:    Procedure explained and questions answered to patient or proxy's satisfaction: yes     Relevant documents present and verified: yes     Test results available and properly labeled: yes     Imaging studies available: yes     Required blood products, implants, devices, and special equipment available: yes     Site/side marked: yes     Immediately prior to procedure a time out was called: yes     Patient identity confirmation method:  Verbally with patient Indications:    Procedure necessitating sedation performed by:  Different physician Pre-sedation assessment:    Time since last food or  drink:  8 hours   ASA classification: class 2 - patient with mild systemic disease     Neck mobility: normal     Mouth opening:  3 or more finger widths   Thyromental distance:  4 finger widths   Mallampati score:  I - soft palate, uvula, fauces, pillars visible   Pre-sedation assessments completed and reviewed: airway patency, cardiovascular function, hydration status, mental status, nausea/vomiting, pain level, respiratory function and temperature   Immediate pre-procedure details:    Reassessment: Patient reassessed immediately prior to procedure     Reviewed: vital signs, relevant labs/tests and NPO status     Verified: bag valve mask available, emergency equipment available, intubation equipment available, IV patency confirmed, oxygen available and suction available   Procedure details (see MAR for exact dosages):    Preoxygenation:  Nasal cannula   Sedation:  Propofol   Intra-procedure monitoring:  Blood pressure monitoring, cardiac monitor, continuous pulse oximetry, frequent LOC assessments and frequent vital sign checks   Intra-procedure events: none     Total Provider sedation time (minutes):  20 Post-procedure details:    Post-sedation assessment completed:  11/30/2017 4:40 PM   Attendance: Constant attendance by certified staff until patient recovered     Recovery: Patient returned to pre-procedure baseline     Post-sedation assessments completed and reviewed: airway patency, cardiovascular function, hydration status, mental status, nausea/vomiting, pain level, respiratory function and temperature     Patient is stable for discharge or admission: yes     Patient tolerance:  Tolerated well, no immediate complications    MDM  Tolerated sedation and reduction well.       Arby Barrette, MD 11/30/17 [id number]

## 2017-11-30 NOTE — Progress Notes (Signed)
Orthopedic Tech Progress Note Patient Details:  [name] [birth year] [id number]  Ortho Devices Type of Ortho Device: Knee Immobilizer Ortho Device/Splint Interventions: Application   Post Interventions Patient Tolerated: Well   Norva Karvonen T 11/30/2017, 4:15 PM

## 2017-11-30 NOTE — ED Notes (Signed)
Post reduction xray requested portable

## 2017-11-30 NOTE — ED Triage Notes (Addendum)
Per EMs- pt has hx of bilateral hip replacements. Pt has had one replaced. She was standing in the bathroom when she felt her left hip pop out. Pt has internal rotation and shortening. She had a total of 150 mcg of fentanyl.

## 2017-11-30 NOTE — ED Notes (Signed)
Ambulated pt using a walker for support. Pt had steady gait and tolerated well. Pt denied pain when walking.

## 2017-11-30 NOTE — ED Notes (Signed)
Pt had 40mg  propofol iv for procedure  40mg  more ptopofol iv  Within 1 minutes

## 2017-11-30 NOTE — ED Notes (Signed)
The pt has has conscious sedation  Ans she is wide awakwe now

## 2017-11-30 NOTE — ED Notes (Signed)
Pt has no complaints  Alert talking to daughter

## 2018-02-19 ENCOUNTER — Emergency Department (HOSPITAL_COMMUNITY): Payer: Medicare Other

## 2018-02-19 ENCOUNTER — Inpatient Hospital Stay (HOSPITAL_COMMUNITY)
Admission: EM | Admit: 2018-02-19 | Discharge: 2018-02-21 | DRG: 309 | Disposition: A | Discharge status: Home / Self Care | Payer: Medicare Other | Attending: Internal Medicine | Admitting: Internal Medicine

## 2018-02-19 ENCOUNTER — Encounter (HOSPITAL_COMMUNITY): Payer: Self-pay | Admitting: Emergency Medicine

## 2018-02-19 DIAGNOSIS — Z79899 Other long term (current) drug therapy: Secondary | ICD-10-CM

## 2018-02-19 DIAGNOSIS — Z888 Allergy status to other drugs, medicaments and biological substances status: Secondary | ICD-10-CM

## 2018-02-19 DIAGNOSIS — Z23 Encounter for immunization: Secondary | ICD-10-CM

## 2018-02-19 DIAGNOSIS — E785 Hyperlipidemia, unspecified: Secondary | ICD-10-CM | POA: Diagnosis present

## 2018-02-19 DIAGNOSIS — I4891 Unspecified atrial fibrillation: Secondary | ICD-10-CM | POA: Diagnosis not present

## 2018-02-19 DIAGNOSIS — Z7901 Long term (current) use of anticoagulants: Secondary | ICD-10-CM

## 2018-02-19 DIAGNOSIS — I313 Pericardial effusion (noninflammatory): Secondary | ICD-10-CM | POA: Diagnosis present

## 2018-02-19 DIAGNOSIS — I10 Essential (primary) hypertension: Secondary | ICD-10-CM | POA: Diagnosis present

## 2018-02-19 DIAGNOSIS — I471 Supraventricular tachycardia: Secondary | ICD-10-CM | POA: Diagnosis not present

## 2018-02-19 DIAGNOSIS — Z5329 Procedure and treatment not carried out because of patient's decision for other reasons: Secondary | ICD-10-CM | POA: Diagnosis not present

## 2018-02-19 DIAGNOSIS — I639 Cerebral infarction, unspecified: Secondary | ICD-10-CM | POA: Diagnosis present

## 2018-02-19 DIAGNOSIS — K219 Gastro-esophageal reflux disease without esophagitis: Secondary | ICD-10-CM | POA: Diagnosis present

## 2018-02-19 DIAGNOSIS — I69354 Hemiplegia and hemiparesis following cerebral infarction affecting left non-dominant side: Secondary | ICD-10-CM

## 2018-02-19 DIAGNOSIS — Z66 Do not resuscitate: Secondary | ICD-10-CM | POA: Diagnosis present

## 2018-02-19 DIAGNOSIS — Z96643 Presence of artificial hip joint, bilateral: Secondary | ICD-10-CM | POA: Diagnosis present

## 2018-02-19 DIAGNOSIS — Z880 Allergy status to penicillin: Secondary | ICD-10-CM

## 2018-02-19 DIAGNOSIS — E876 Hypokalemia: Secondary | ICD-10-CM | POA: Diagnosis present

## 2018-02-19 LAB — COMPREHENSIVE METABOLIC PANEL
ALT: 12 U/L (ref 0–44)
AST: 24 U/L (ref 15–41)
Albumin: 4.3 g/dL (ref 3.5–5.0)
Alkaline Phosphatase: 46 U/L (ref 38–126)
Anion gap: 12 (ref 5–15)
BUN: 7 mg/dL — AB (ref 8–23)
CHLORIDE: 102 mmol/L (ref 98–111)
CO2: 24 mmol/L (ref 22–32)
Calcium: 9.3 mg/dL (ref 8.9–10.3)
Creatinine, Ser: 0.77 mg/dL (ref 0.44–1.00)
GFR calc Af Amer: >60 mL/min (ref >60)
Glucose, Bld: 128 mg/dL — ABNORMAL HIGH (ref 70–99)
POTASSIUM: 3.2 mmol/L — AB (ref 3.5–5.1)
SODIUM: 138 mmol/L (ref 135–145)
Total Bilirubin: 1.2 mg/dL (ref 0.3–1.2)
Total Protein: 6.8 g/dL (ref 6.5–8.1)

## 2018-02-19 LAB — CBC
HCT: 41.8 % (ref 36.0–46.0)
Hemoglobin: 14.2 g/dL (ref 12.0–15.0)
MCH: 29.7 pg (ref 26.0–34.0)
MCHC: 34 g/dL (ref 30.0–36.0)
MCV: 87.4 fL (ref 80.0–100.0)
NRBC: 0 % (ref 0.0–0.2)
PLATELETS: 179 10*3/uL (ref 150–400)
RBC: 4.78 MIL/uL (ref 3.87–5.11)
RDW: 11.4 % — ABNORMAL LOW (ref 11.5–15.5)
WBC: 7.7 10*3/uL (ref 4.0–10.5)

## 2018-02-19 MED ORDER — PANTOPRAZOLE SODIUM 40 MG PO TBEC
40.0000 mg | DELAYED_RELEASE_TABLET | Freq: Every day | ORAL | Status: DC
  Administered 2018-02-20 – 2018-02-21 (×2): 40 mg via ORAL
  Filled 2018-02-19 (×2): qty 1

## 2018-02-19 MED ORDER — ACETAMINOPHEN 650 MG RE SUPP: 650.0000 mg | Freq: Four times a day (QID) | RECTAL | Status: DC | PRN

## 2018-02-19 MED ORDER — AMLODIPINE BESYLATE 10 MG PO TABS
10.0000 mg | ORAL_TABLET | Freq: Every day | ORAL | Status: DC
  Administered 2018-02-20: 10 mg via ORAL
  Filled 2018-02-19: qty 1

## 2018-02-19 MED ORDER — APIXABAN 2.5 MG PO TABS: 2.5000 mg | ORAL_TABLET | Freq: Two times a day (BID) | ORAL | 0 refills | Status: DC

## 2018-02-19 MED ORDER — ONDANSETRON HCL 4 MG PO TABS: 4.0000 mg | ORAL_TABLET | Freq: Four times a day (QID) | ORAL | Status: DC | PRN

## 2018-02-19 MED ORDER — ADULT MULTIVITAMIN W/MINERALS CH
1.0000 | ORAL_TABLET | Freq: Every day | ORAL | Status: DC
  Administered 2018-02-20 – 2018-02-21 (×2): 1 via ORAL
  Filled 2018-02-19 (×2): qty 1

## 2018-02-19 MED ORDER — DILTIAZEM HCL 60 MG PO TABS
60.0000 mg | ORAL_TABLET | Freq: Once | ORAL | Status: AC
  Administered 2018-02-19: 60 mg via ORAL
  Filled 2018-02-19: qty 1

## 2018-02-19 MED ORDER — DILTIAZEM HCL 60 MG PO TABS: 60.0000 mg | ORAL_TABLET | Freq: Two times a day (BID) | ORAL | 0 refills | Status: DC

## 2018-02-19 MED ORDER — DILTIAZEM HCL-DEXTROSE 100-5 MG/100ML-% IV SOLN (PREMIX): 5.0000 mg/h | INTRAVENOUS | Status: DC

## 2018-02-19 MED ORDER — ATENOLOL 50 MG PO TABS
50.0000 mg | ORAL_TABLET | Freq: Every day | ORAL | Status: DC
  Administered 2018-02-20: 50 mg via ORAL
  Filled 2018-02-19: qty 1

## 2018-02-19 MED ORDER — ACETAMINOPHEN 325 MG PO TABS
650.0000 mg | ORAL_TABLET | Freq: Once | ORAL | Status: AC
  Administered 2018-02-19: 650 mg via ORAL

## 2018-02-19 MED ORDER — DILTIAZEM HCL 25 MG/5ML IV SOLN: 15.0000 mg | Freq: Once | INTRAVENOUS | Status: DC

## 2018-02-19 MED ORDER — LATANOPROST 0.005 % OP SOLN
1.0000 [drp] | Freq: Every day | OPHTHALMIC | Status: DC
  Administered 2018-02-19 – 2018-02-20 (×2): 1 [drp] via OPHTHALMIC
  Filled 2018-02-19: qty 2.5

## 2018-02-19 MED ORDER — DILTIAZEM LOAD VIA INFUSION: 20.0000 mg | Freq: Once | INTRAVENOUS | Status: DC

## 2018-02-19 MED ORDER — DILTIAZEM HCL 25 MG/5ML IV SOLN
15.0000 mg | Freq: Once | INTRAVENOUS | Status: AC
  Administered 2018-02-19: 15 mg via INTRAVENOUS
  Filled 2018-02-19: qty 5

## 2018-02-19 MED ORDER — PNEUMOCOCCAL VAC POLYVALENT 25 MCG/0.5ML IJ INJ
0.5000 mL | INJECTION | INTRAMUSCULAR | Status: AC
  Administered 2018-02-21: 0.5 mL via INTRAMUSCULAR
  Filled 2018-02-19: qty 0.5

## 2018-02-19 MED ORDER — APIXABAN 2.5 MG PO TABS
2.5000 mg | ORAL_TABLET | Freq: Two times a day (BID) | ORAL | Status: DC
  Administered 2018-02-20 (×2): 2.5 mg via ORAL
  Filled 2018-02-19 (×2): qty 1

## 2018-02-19 MED ORDER — APIXABAN 2.5 MG PO TABS
2.5000 mg | ORAL_TABLET | Freq: Once | ORAL | Status: AC
  Administered 2018-02-19: 2.5 mg via ORAL
  Filled 2018-02-19: qty 1

## 2018-02-19 MED ORDER — ONDANSETRON HCL 4 MG/2ML IJ SOLN: 4.0000 mg | Freq: Four times a day (QID) | INTRAMUSCULAR | Status: DC | PRN

## 2018-02-19 MED ORDER — OXYCODONE-ACETAMINOPHEN 5-325 MG PO TABS: 0.5000 | ORAL_TABLET | Freq: Four times a day (QID) | ORAL | Status: DC | PRN

## 2018-02-19 MED ORDER — DILTIAZEM HCL-DEXTROSE 100-5 MG/100ML-% IV SOLN (PREMIX)
5.0000 mg/h | INTRAVENOUS | Status: DC
  Administered 2018-02-19: 5 mg/h via INTRAVENOUS
  Filled 2018-02-19: qty 100

## 2018-02-19 MED ORDER — ACETAMINOPHEN 325 MG PO TABS: 650.0000 mg | ORAL_TABLET | Freq: Four times a day (QID) | ORAL | Status: DC | PRN

## 2018-02-19 MED ORDER — ROSUVASTATIN CALCIUM 5 MG PO TABS
5.0000 mg | ORAL_TABLET | Freq: Every day | ORAL | Status: DC
  Administered 2018-02-19: 5 mg via ORAL
  Filled 2018-02-19: qty 1

## 2018-02-19 NOTE — ED Provider Notes (Addendum)
[facility] EMERGENCY DEPARTMENT Provider Note   CSN: [id number] Arrival date & time: 02/19/18  1755     History   Chief Complaint Chief Complaint  Patient presents with  . Palpitations    HPI [name] is a 85 y.o. female.  HPI Patient is an 85 year old female presents the emergency department complaints of acute onset palpitations for the weekend.  The been waxing and waning.  No syncope.  No chest pain.  No shortness of breath.  Patient has no prior history of A. fib.  She presents the emergency department in rapid atrial fibrillation.  No new medications.  No cessation of any recent medications.  Denies daily alcohol use.  No excessive use of caffeine.  Symptoms are mild in severity.  She can feel the palpitations.  They have been present for 3 to 4 days.  She is not on anticoagulation.   Past Medical History:  Diagnosis Date  . Acute ischemic stroke (HCC) 12/28/2015   [name] 12/28/2015  . Arthritis    "hands" (12/28/2015)  . GERD (gastroesophageal reflux disease)   . High cholesterol   . Hypertension   . PONV (postoperative nausea and vomiting)    "w/1st hip replacement"  . Symptomatic cholelithiasis     Patient Active Problem List   Diagnosis Date Noted  . Hyperlipidemia 08/28/2016  . AKI (acute kidney injury) (HCC)   . Frequent stools   . Sleep disturbance   . Hemiparesis of right nondominant side due to cerebral infarction   . Benign essential HTN   . 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 Ataxia, late effect of cerebrovascular disease   . Alteration of sensation as late effect of stroke   . CVA (cerebral vascular accident) (HCC) 12/29/2015  . Left sided numbness 12/28/2015  . Nausea & vomiting 12/28/2015  . Diarrhea 12/28/2015  . HTN (hypertension) 12/28/2015  . Acute encephalopathy 12/23/2015  . UTI (urinary tract infection) 12/23/2015  . Hyponatremia 12/23/2015  . Dehydration with hyponatremia 12/23/2015    Past Surgical  History:  Procedure Laterality Date  . CATARACT EXTRACTION W/ INTRAOCULAR LENS IMPLANT Right ~ 2011  . CHOLECYSTECTOMY N/A 08/17/2017   Procedure: LAPAROSCOPIC CHOLECYSTECTOMY WITH INTRAOPERATIVE CHOLANGIOGRAM ERAS PATHWAY;  Surgeon: Jimmye Norman, MD;  Location: [facility] OR;  Service: General;  Laterality: N/A;  . HIP CLOSED REDUCTION Right 09/29/2016   Procedure: CLOSED REDUCTION RIGHT HIP;  Surgeon: Samson Frederic, MD;  Location: [facility] OR;  Service: Orthopedics;  Laterality: Right;  . JOINT REPLACEMENT    . TOTAL HIP ARTHROPLASTY  2007-2008   left-right     OB History   No obstetric history on file.      Home Medications    Prior to Admission medications   Medication Sig Start Date End Date Taking? Authorizing Provider  amLODipine (NORVASC) 10 MG tablet Take 1 tablet (10 mg total) by mouth daily. 01/06/16   Angiulli, Mcarthur Rossetti, PA-C  apixaban (ELIQUIS) 2.5 MG TABS tablet Take 1 tablet (2.5 mg total) by mouth 2 (two) times daily for 30 days. 02/19/18 03/21/18  Azalia Bilis, MD  aspirin 325 MG tablet Take 1 tablet (325 mg total) by mouth daily. 12/30/15   Joseph Art, DO  atenolol (TENORMIN) 50 MG tablet Take 1 tablet (50 mg total) by mouth daily. 01/06/16   Angiulli, Mcarthur Rossetti, PA-C  Calcium Carb-Cholecalciferol (CALCIUM 600+D3 PO) Take 1 tablet by mouth 2 (two) times daily.    [provider]  diltiazem (CARDIZEM) 60 MG  tablet Take 1 tablet (60 mg total) by mouth 2 (two) times daily. 02/19/18   Azalia Bilisampos, Bekim Werntz, MD  latanoprost (XALATAN) 0.005 % ophthalmic solution Place 1 drop into both eyes at bedtime.    [provider]  Multiple Vitamin (MULTI-VITAMINS) TABS Take 1 tablet by mouth daily.     [provider]  Omega-3 Fatty Acids (FISH OIL PO) Take 1 tablet by mouth daily.    [provider]  ondansetron (ZOFRAN ODT) 4 MG disintegrating tablet Take 1 tablet (4 mg total) by mouth every 8 (eight) hours as needed for nausea or vomiting. 08/16/17   Cecille PoMacklin, Nicholas  W, MD  oxyCODONE (OXY IR/ROXICODONE) 5 MG immediate release tablet Take 0.5 tablets (2.5 mg total) by mouth every 6 (six) hours as needed for severe pain. 08/17/17   Jimmye NormanWyatt, James, MD  oxyCODONE-acetaminophen (PERCOCET/ROXICET) 5-325 MG tablet Take 0.5 tablets by mouth every 6 (six) hours as needed for severe pain. 08/16/17   Cecille PoMacklin, Nicholas W, MD  pantoprazole (PROTONIX) 40 MG tablet Take 1 tablet (40 mg total) by mouth daily. 01/06/16   Angiulli, Mcarthur Rossettianiel J, PA-C  rosuvastatin (CRESTOR) 5 MG tablet Take 1 tablet (5 mg total) by mouth daily at 6 PM. 01/06/16   Angiulli, Mcarthur Rossettianiel J, PA-C  traMADol (ULTRAM) 50 MG tablet Take 50 mg by mouth every 12 (twelve) hours as needed. for pain 07/20/17   [provider]    Family History Family History  Problem Relation Age of Onset  . CAD Father   . Stroke Father   . Alcohol abuse Brother   . CAD Brother   . Aneurysm Brother     Social History Social History   Tobacco Use  . Smoking status: Never Smoker  . Smokeless tobacco: Never Used  Substance Use Topics  . Alcohol use: No  . Drug use: No     Allergies   Ativan [lorazepam]; Glucosamine sulfate; Niacin; Phenergan [promethazine]; and Amoxicillin   Review of Systems Review of Systems  All other systems reviewed and are negative.    Physical Exam Updated Vital Signs BP (!) 133/52   Pulse 68   Temp 98.3 F (36.8 C) (Oral)   Resp 19   Ht 5' (1.524 m)   Wt 45.4 kg   SpO2 100%   BMI 19.53 kg/m   Physical Exam Vitals signs and nursing note reviewed.  Constitutional:      General: She is not in acute distress.    Appearance: She is well-developed.  HENT:     Head: Normocephalic and atraumatic.  Neck:     Musculoskeletal: Normal range of motion.  Cardiovascular:     Rate and Rhythm: Tachycardia present. Rhythm irregular.     Heart sounds: Normal heart sounds.  Pulmonary:     Effort: Pulmonary effort is normal.     Breath sounds: Normal breath sounds.  Abdominal:      General: There is no distension.     Palpations: Abdomen is soft.     Tenderness: There is no abdominal tenderness.  Musculoskeletal: Normal range of motion.  Skin:    General: Skin is warm and dry.  Neurological:     Mental Status: She is alert and oriented to person, place, and time.  Psychiatric:        Judgment: Judgment normal.      ED Treatments / Results  Labs (all labs ordered are listed, but only abnormal results are displayed) Labs Reviewed  CBC - Abnormal; Notable for the following  components:      Result Value   RDW 11.4 (*)    All other components within normal limits  COMPREHENSIVE METABOLIC PANEL - Abnormal; Notable for the following components:   Potassium 3.2 (*)    Glucose, Bld 128 (*)    BUN 7 (*)    All other components within normal limits  TSH    EKG EKG Interpretation #1  Date/Time:  Tuesday February 19 2018 18:03:32 EST Ventricular Rate:  183 PR Interval:    QRS Duration: 101 QT Interval:  291 QTC Calculation: 508 R Axis:   89 Text Interpretation:  Atrial fibrillation with rapid V-rate Borderline right axis deviation Low voltage, extremity leads Repolarization abnormality, prob rate related Baseline wander in lead(s) I III aVL changed from prior. afib is new Confirmed by Azalia Bilis ([id number]) on 02/19/2018 8:48:33 PM     EKG Interpretation #2  Date/Time:  Tuesday February 19 2018 20:22:00 EST Ventricular Rate:  67 PR Interval:  156 QRS Duration: 90 QT Interval:  444 QTC Calculation: 469 R Axis:   85 Text Interpretation:  Normal sinus rhythm Possible Anterior infarct , age undetermined Abnormal ECG afib resolved as compared to earlier ecg Confirmed by Azalia Bilis ([id number]) on 02/19/2018 8:49:14 PM         Radiology Dg Chest Portable 1 View  Result Date: 02/19/2018 CLINICAL DATA:  Patient complains of palpitations and cough for 2 days. EXAM: PORTABLE CHEST 1 VIEW COMPARISON:  08/07/2017 FINDINGS: The heart size and mediastinal contours  are within normal limits. Both lungs are clear. The visualized skeletal structures are unremarkable. IMPRESSION: No active disease. Electronically Signed   By: Signa Kell M.D.   On: 02/19/2018 18:59    Procedures .Critical Care Performed by: Azalia Bilis, MD Authorized by: Azalia Bilis, MD   Critical care provider statement:    Critical care time (minutes):  31   Critical care was time spent personally by me on the following activities:  Discussions with consultants, evaluation of patient's response to treatment, examination of patient, ordering and performing treatments and interventions, ordering and review of laboratory studies, ordering and review of radiographic studies, pulse oximetry, re-evaluation of patient's condition, obtaining history from patient or surrogate and review of old charts   (including critical care time)  Medications Ordered in ED Medications  diltiazem (CARDIZEM) 100 mg in dextrose 5% (1 mg/mL) infusion (0 mg/hr Intravenous Stopped 02/19/18 2013)  apixaban (ELIQUIS) tablet 2.5 mg (has no administration in time range)  diltiazem (CARDIZEM) injection 15 mg (15 mg Intravenous Given 02/19/18 1815)  diltiazem (CARDIZEM) tablet 60 mg (60 mg Oral Given 02/19/18 2005)  acetaminophen (TYLENOL) tablet 650 mg (650 mg Oral Given 02/19/18 2014)     Initial Impression / Assessment and Plan / ED Course  I have reviewed the triage vital signs and the nursing notes.  Pertinent labs & imaging results that were available during my care of the patient were reviewed by me and considered in my medical decision making (see chart for details).      Patient converted to sinus rhythm with IV Cardizem.  Switch to oral Cardizem.  Remains in sinus rhythm at this time.  Patient will be discharged home on 2.5 mg twice daily Eliquis.  She will need follow-up in the atrial fibrillation clinic.  She is been given discharge instructions and understands importance of close cardiology  follow-up.  She will be started on oral Cardizem as well.  She is been given precautions in regards to  initiation of chronic anticoagulation given her risk score of 6   This patients CHA2DS2-VASc Score and unadjusted Ischemic Stroke Rate (% per year) is equal to 9.7 % stroke rate/year from a score of 6  Above score calculated as 1 point each if present [CHF, HTN, DM, Vascular=MI/PAD/Aortic Plaque, Age if 65-74, or Female] Above score calculated as 2 points each if present [Age > 75, or Stroke/TIA/TE]    Final Clinical Impressions(s) / ED Diagnoses   Final diagnoses:  Atrial fibrillation with rapid ventricular response [facility])    ED Discharge Orders         Ordered    Amb referral to AFIB Clinic     02/19/18 2041    diltiazem (CARDIZEM) 60 MG tablet  2 times daily     02/19/18 2042    apixaban (ELIQUIS) 2.5 MG TABS tablet  2 times daily     02/19/18 2042           Azalia Bilis, MD 02/19/18 2049    Azalia Bilis, MD 02/19/18 2125

## 2018-02-19 NOTE — Discharge Instructions (Addendum)
Please contact the cardiology office for follow up  Take the two new prescriptions as discussed  You are on a BLOOD THINNER.  Please return to the emergency department for any acute injury, head injury, blood noted in your stool or urine or elsewhere  Please read the discharge instructions provided

## 2018-02-19 NOTE — ED Notes (Signed)
Patient HR is fluctuating from 60's to 180's, sinus tach. Md made aware.

## 2018-02-19 NOTE — Progress Notes (Signed)
ANTICOAGULATION CONSULT NOTE - Initial Consult  Pharmacy Consult for apixaban Indication: atrial fibrillation  Allergies  Allergen Reactions  . Ativan [Lorazepam] Other (See Comments)    altered  . Glucosamine Sulfate Other (See Comments)    unknown  . Niacin Nausea And Vomiting  . Phenergan [Promethazine] Other (See Comments)    "Seeing things"    . Amoxicillin Rash    Patient Measurements: Height: 5' (152.4 cm) Weight: 100 lb (45.4 kg) IBW/kg (Calculated) : 45.5  Vital Signs: Temp: 98.6 F (37 C) (01/21 2300) Temp Source: Oral (01/21 2300) BP: 121/98 (01/21 2327) Pulse Rate: 53 (01/21 2330)  Labs: Recent Labs    02/19/18 1824  HGB 14.2  HCT 41.8  PLT 179  CREATININE 0.77    Estimated Creatinine Clearance: 38.9 mL/min (by C-G formula based on SCr of 0.77 mg/dL).   Medical History: Past Medical History:  Diagnosis Date  . Acute ischemic stroke (HCC) 12/28/2015   [name] 12/28/2015  . Arthritis    "hands" (12/28/2015)  . GERD (gastroesophageal reflux disease)   . High cholesterol   . Hypertension   . PONV (postoperative nausea and vomiting)    "w/1st hip replacement"  . Symptomatic cholelithiasis     Medications:  Medications Prior to Admission  Medication Sig Dispense Refill Last Dose  . amLODipine (NORVASC) 10 MG tablet Take 1 tablet (10 mg total) by mouth daily. 30 tablet 1 02/19/2018 at Unknown time  . atenolol (TENORMIN) 50 MG tablet Take 1 tablet (50 mg total) by mouth daily. 30 tablet 1 02/19/2018 at 0500  . Calcium Carb-Cholecalciferol (CALCIUM 600+D3 PO) Take 1 tablet by mouth 2 (two) times daily.   02/19/2018 at Unknown time  . latanoprost (XALATAN) 0.005 % ophthalmic solution Place 1 drop into both eyes at bedtime.   02/18/2018 at Unknown time  . Multiple Vitamin (MULTI-VITAMINS) TABS Take 1 tablet by mouth daily.    02/19/2018 at Unknown time  . Omega-3 Fatty Acids (FISH OIL PO) Take 1 tablet by mouth daily.   02/19/2018 at Unknown time  .  ondansetron (ZOFRAN ODT) 4 MG disintegrating tablet Take 1 tablet (4 mg total) by mouth every 8 (eight) hours as needed for nausea or vomiting. 10 tablet 0 unk  . oxyCODONE (OXY IR/ROXICODONE) 5 MG immediate release tablet Take 0.5 tablets (2.5 mg total) by mouth every 6 (six) hours as needed for severe pain. 15 tablet 0 unk  . oxyCODONE-acetaminophen (PERCOCET/ROXICET) 5-325 MG tablet Take 0.5 tablets by mouth every 6 (six) hours as needed for severe pain. 2 tablet 0 unk  . pantoprazole (PROTONIX) 40 MG tablet Take 1 tablet (40 mg total) by mouth daily. 30 tablet 1 02/19/2018 at Unknown time  . rosuvastatin (CRESTOR) 5 MG tablet Take 1 tablet (5 mg total) by mouth daily at 6 PM. 30 tablet 1 02/18/2018 at Unknown time  . traMADol (ULTRAM) 50 MG tablet Take 50 mg by mouth every 12 (twelve) hours as needed. for pain  0 unk  . aspirin 325 MG tablet Take 1 tablet (325 mg total) by mouth daily. (Patient not taking: Reported on 02/19/2018)   Not Taking at Unknown time    Assessment: 85 y.o. F presents with new onset afib. To begin apixaban for new onset afib. Pt qualifies for reduced dose (age 62, wt 45.4kg) No AC PTA. CBC ok on admission.  Goal of Therapy:  Prevention of CVA Monitor platelets by anticoagulation protocol: Yes   Plan:  Apixaban 2.5mg  po BID Will f/u CBC  Christoper Fabianaron Keon Pender, PharmD, BCPS Clinical pharmacist  **Pharmacist phone directory can now be found on [email or web address] ([email or web address]).  [facility] under [facility]. 02/19/2018,11:51 PM

## 2018-02-19 NOTE — H&P (Signed)
History and Physical    MARCIELA ARNTZ HFG:[id number] DOB: 24-Jul-1933 DOA: 02/19/2018  PCP: Roger Kill, PA-C   Patient coming from: Home.  Chief Complaint: Palpitations.  HPI: [name] is a 85 y.o. female with history of stroke, hypertension, hyperlipidemia presents to the ER with subjective feeling of palpitations over the last 3 to 4 days.  Denies any chest pain or shortness of breath at times feels dizzy.  Did not lose consciousness.  ED Course: In the ER patient was found to be in A. fib with RVR and was initially started on Cardizem infusion soon converted back to normal sinus rhythm patient was started on apixaban and plan was to discharge home.  But patient converted back to A. fib with RVR and Cardizem infusion was restarted.  Patient admitted for further observation for A. fib with RVR.  Review of Systems: As per HPI, rest all negative.   Past Medical History:  Diagnosis Date  . Acute ischemic stroke (HCC) 12/28/2015   [name] 12/28/2015  . Arthritis    "hands" (12/28/2015)  . GERD (gastroesophageal reflux disease)   . High cholesterol   . Hypertension   . PONV (postoperative nausea and vomiting)    "w/1st hip replacement"  . Symptomatic cholelithiasis     Past Surgical History:  Procedure Laterality Date  . CATARACT EXTRACTION W/ INTRAOCULAR LENS IMPLANT Right ~ 2011  . CHOLECYSTECTOMY N/A 08/17/2017   Procedure: LAPAROSCOPIC CHOLECYSTECTOMY WITH INTRAOPERATIVE CHOLANGIOGRAM ERAS PATHWAY;  Surgeon: Jimmye Norman, MD;  Location: [facility];  Service: General;  Laterality: N/A;  . HIP CLOSED REDUCTION Right 09/29/2016   Procedure: CLOSED REDUCTION RIGHT HIP;  Surgeon: Samson Frederic, MD;  Location: [facility] OR;  Service: Orthopedics;  Laterality: Right;  . JOINT REPLACEMENT    . TOTAL HIP ARTHROPLASTY  2007-2008   left-right     reports that she has never smoked. She has never used smokeless tobacco. She reports that she does not drink alcohol or use  drugs.  Allergies  Allergen Reactions  . Ativan [Lorazepam] Other (See Comments)    altered  . Glucosamine Sulfate Other (See Comments)    unknown  . Niacin Nausea And Vomiting  . Phenergan [Promethazine] Other (See Comments)    "Seeing things"    . Amoxicillin Rash    Family History  Problem Relation Age of Onset  . CAD Father   . Stroke Father   . Alcohol abuse Brother   . CAD Brother   . Aneurysm Brother     Prior to Admission medications   Medication Sig Start Date End Date Taking? Authorizing Provider  amLODipine (NORVASC) 10 MG tablet Take 1 tablet (10 mg total) by mouth daily. 01/06/16  Yes Angiulli, Mcarthur Rossetti, PA-C  atenolol (TENORMIN) 50 MG tablet Take 1 tablet (50 mg total) by mouth daily. 01/06/16  Yes Angiulli, Mcarthur Rossetti, PA-C  Calcium Carb-Cholecalciferol (CALCIUM 600+D3 PO) Take 1 tablet by mouth 2 (two) times daily.   Yes [provider]  latanoprost (XALATAN) 0.005 % ophthalmic solution Place 1 drop into both eyes at bedtime.   Yes [provider]  Multiple Vitamin (MULTI-VITAMINS) TABS Take 1 tablet by mouth daily.    Yes [provider]  Omega-3 Fatty Acids (FISH OIL PO) Take 1 tablet by mouth daily.   Yes [provider]  ondansetron (ZOFRAN ODT) 4 MG disintegrating tablet Take 1 tablet (4 mg total) by mouth every 8 (eight) hours as needed for nausea or vomiting. 08/16/17  Yes Cecille PoMacklin, Nicholas W, MD  oxyCODONE (OXY IR/ROXICODONE) 5 MG immediate release tablet Take 0.5 tablets (2.5 mg total) by mouth every 6 (six) hours as needed for severe pain. 08/17/17  Yes Jimmye NormanWyatt, James, MD  oxyCODONE-acetaminophen (PERCOCET/ROXICET) 5-325 MG tablet Take 0.5 tablets by mouth every 6 (six) hours as needed for severe pain. 08/16/17  Yes Cecille PoMacklin, Nicholas W, MD  pantoprazole (PROTONIX) 40 MG tablet Take 1 tablet (40 mg total) by mouth daily. 01/06/16  Yes Angiulli, Mcarthur Rossettianiel J, PA-C  rosuvastatin (CRESTOR) 5 MG tablet Take 1 tablet (5 mg total) by mouth  daily at 6 PM. 01/06/16  Yes Angiulli, Mcarthur Rossettianiel J, PA-C  traMADol (ULTRAM) 50 MG tablet Take 50 mg by mouth every 12 (twelve) hours as needed. for pain 07/20/17  Yes [provider]  apixaban (ELIQUIS) 2.5 MG TABS tablet Take 1 tablet (2.5 mg total) by mouth 2 (two) times daily for 30 days. 02/19/18 03/21/18  Azalia Bilisampos, Kevin, MD  aspirin 325 MG tablet Take 1 tablet (325 mg total) by mouth daily. Patient not taking: Reported on 02/19/2018 12/30/15   Joseph ArtVann, Jessica U, DO  diltiazem (CARDIZEM) 60 MG tablet Take 1 tablet (60 mg total) by mouth 2 (two) times daily. 02/19/18   Azalia Bilisampos, Kevin, MD    Physical Exam: Vitals:   02/19/18 1915 02/19/18 1930 02/19/18 1945 02/19/18 2107  BP: (!) 119/57 (!) 116/53 (!) 133/52 (!) 113/94  Pulse: 68 68 68 (!) 55  Resp: 16 17 19 16   Temp:      TempSrc:      SpO2: 99% 100% 100% 100%  Weight:      Height:          Constitutional: Moderately built and nourished. Vitals:   02/19/18 1915 02/19/18 1930 02/19/18 1945 02/19/18 2107  BP: (!) 119/57 (!) 116/53 (!) 133/52 (!) 113/94  Pulse: 68 68 68 (!) 55  Resp: 16 17 19 16   Temp:      TempSrc:      SpO2: 99% 100% 100% 100%  Weight:      Height:       Eyes: Anicteric no pallor. ENMT: No discharge from the ears eyes nose or mouth. Neck: No mass felt.  No neck rigidity but no JVD appreciated. Respiratory: No rhonchi or crepitations. Cardiovascular: S1-S2 heard tachycardic. Abdomen: Soft nontender bowel sounds present. Musculoskeletal: No edema.  No joint effusion. Skin: No rash. Neurologic: Alert awake oriented to time place and person.  Moves all extremities. Psychiatric: Appears normal per normal affect.   Labs on Admission: I have personally reviewed following labs and imaging studies  CBC: Recent Labs  Lab 02/19/18 1824  WBC 7.7  HGB 14.2  HCT 41.8  MCV 87.4  PLT 179   Basic Metabolic Panel: Recent Labs  Lab 02/19/18 1824  NA 138  K 3.2*  CL 102  CO2 24  GLUCOSE 128*  BUN 7*   CREATININE 0.77  CALCIUM 9.3   GFR: Estimated Creatinine Clearance: 38.9 mL/min (by C-G formula based on SCr of 0.77 mg/dL). Liver Function Tests: Recent Labs  Lab 02/19/18 1824  AST 24  ALT 12  ALKPHOS 46  BILITOT 1.2  PROT 6.8  ALBUMIN 4.3   No results for input(s): LIPASE, AMYLASE in the last 168 hours. No results for input(s): AMMONIA in the last 168 hours. Coagulation Profile: No results for input(s): INR, PROTIME in the last 168 hours. Cardiac Enzymes: No results for input(s): CKTOTAL, CKMB, CKMBINDEX, TROPONINI in the last 168 hours. BNP (  last 3 results) No results for input(s): PROBNP in the last 8760 hours. HbA1C: No results for input(s): HGBA1C in the last 72 hours. CBG: No results for input(s): GLUCAP in the last 168 hours. Lipid Profile: No results for input(s): CHOL, HDL, LDLCALC, TRIG, CHOLHDL, LDLDIRECT in the last 72 hours. Thyroid Function Tests: No results for input(s): TSH, T4TOTAL, FREET4, T3FREE, THYROIDAB in the last 72 hours. Anemia Panel: No results for input(s): VITAMINB12, FOLATE, FERRITIN, TIBC, IRON, RETICCTPCT in the last 72 hours. Urine analysis:    Component Value Date/Time   COLORURINE COLORLESS (A) 08/16/2017 1840   APPEARANCEUR CLEAR 08/16/2017 1840   LABSPEC 1.002 (L) 08/16/2017 1840   PHURINE 7.0 08/16/2017 1840   GLUCOSEU NEGATIVE 08/16/2017 1840   HGBUR NEGATIVE 08/16/2017 1840   BILIRUBINUR NEGATIVE 08/16/2017 1840   KETONESUR 5 (A) 08/16/2017 1840   PROTEINUR NEGATIVE 08/16/2017 1840   NITRITE NEGATIVE 08/16/2017 1840   LEUKOCYTESUR NEGATIVE 08/16/2017 1840   Sepsis Labs: @LABRCNTIP (procalcitonin:4,lacticidven:4) )No results found for this or any previous visit (from the past 240 hour(s)).   Radiological Exams on Admission: Dg Chest Portable 1 View  Result Date: 02/19/2018 CLINICAL DATA:  Patient complains of palpitations and cough for 2 days. EXAM: PORTABLE CHEST 1 VIEW COMPARISON:  08/07/2017 FINDINGS: The heart size  and mediastinal contours are within normal limits. Both lungs are clear. The visualized skeletal structures are unremarkable. IMPRESSION: No active disease. Electronically Signed   By: Signa Kellaylor  Stroud M.D.   On: 02/19/2018 18:59    EKG: Independently reviewed.  A. fib with RVR.  Assessment/Plan Principal Problem:   Atrial fibrillation with RVR (HCC) Active Problems:   HTN (hypertension)   CVA (cerebral vascular accident) (HCC)    1. A. fib with RVR -on Cardizem infusion.  Patient is also on atenolol and if patient's heart rate improves after taking atenolol slowly wean off Cardizem infusion.  Patient's chads 2 vasc score is at least 5 given history of stroke.  Patient is on apixaban.  Check 2D echo.  Thyroid function test is normal.  Follow cardiac markers. 2. Hypertension on amlodipine and atenolol. 3. History of stroke on statins and presently apixaban.   DVT prophylaxis: Apixaban. Code Status: DNR. Family Communication: No family at the bedside. Disposition Plan: Home. Consults called: None. Admission status: Observation.   Eduard ClosArshad N Yilia Sacca MD [facility] Pager [phone].  If 7PM-7AM, please contact night-coverage [email or web address] Password [facility]  02/19/2018, 10:59 PM

## 2018-02-19 NOTE — ED Provider Notes (Signed)
9:02 PM Patient had converted to sinus rhythm and is doing well.  At time of discharge she has gone back into rapid atrial fibrillation with a heart rate back up into the 140s and 160s.  She does not feel good.  We will restart her Cardizem drip.  She will receive 15 mg IV Cardizem bolus now and her drip will be restarted at 5 mg.  She will need at least observational admission overnight for recurrent A. fib with RVR.   Azalia Bilis, MD 02/19/18 2103

## 2018-02-19 NOTE — ED Triage Notes (Signed)
Pt arrives via EMS with reports of heart palpitations and loss of appetite x2 days. Pt was found to be in SVT with rate of 180-200. Pt converted to possible Afib rate of 70-90 and then back to 180-200. EMS gave 500 cc NS.

## 2018-02-20 ENCOUNTER — Observation Stay (HOSPITAL_COMMUNITY): Payer: Medicare Other

## 2018-02-20 ENCOUNTER — Other Ambulatory Visit: Payer: Self-pay

## 2018-02-20 DIAGNOSIS — Z66 Do not resuscitate: Secondary | ICD-10-CM | POA: Diagnosis present

## 2018-02-20 DIAGNOSIS — K219 Gastro-esophageal reflux disease without esophagitis: Secondary | ICD-10-CM | POA: Diagnosis present

## 2018-02-20 DIAGNOSIS — I471 Supraventricular tachycardia: Secondary | ICD-10-CM | POA: Diagnosis present

## 2018-02-20 DIAGNOSIS — Z888 Allergy status to other drugs, medicaments and biological substances status: Secondary | ICD-10-CM | POA: Diagnosis not present

## 2018-02-20 DIAGNOSIS — I69354 Hemiplegia and hemiparesis following cerebral infarction affecting left non-dominant side: Secondary | ICD-10-CM | POA: Diagnosis not present

## 2018-02-20 DIAGNOSIS — Z7901 Long term (current) use of anticoagulants: Secondary | ICD-10-CM | POA: Diagnosis not present

## 2018-02-20 DIAGNOSIS — I63331 Cerebral infarction due to thrombosis of right posterior cerebral artery: Secondary | ICD-10-CM | POA: Diagnosis not present

## 2018-02-20 DIAGNOSIS — Z5329 Procedure and treatment not carried out because of patient's decision for other reasons: Secondary | ICD-10-CM | POA: Diagnosis not present

## 2018-02-20 DIAGNOSIS — I313 Pericardial effusion (noninflammatory): Secondary | ICD-10-CM | POA: Diagnosis present

## 2018-02-20 DIAGNOSIS — Z96643 Presence of artificial hip joint, bilateral: Secondary | ICD-10-CM | POA: Diagnosis present

## 2018-02-20 DIAGNOSIS — Z79899 Other long term (current) drug therapy: Secondary | ICD-10-CM | POA: Diagnosis not present

## 2018-02-20 DIAGNOSIS — Z23 Encounter for immunization: Secondary | ICD-10-CM | POA: Diagnosis present

## 2018-02-20 DIAGNOSIS — E876 Hypokalemia: Secondary | ICD-10-CM | POA: Diagnosis present

## 2018-02-20 DIAGNOSIS — Z880 Allergy status to penicillin: Secondary | ICD-10-CM | POA: Diagnosis not present

## 2018-02-20 DIAGNOSIS — I4891 Unspecified atrial fibrillation: Secondary | ICD-10-CM | POA: Diagnosis present

## 2018-02-20 DIAGNOSIS — E785 Hyperlipidemia, unspecified: Secondary | ICD-10-CM | POA: Diagnosis present

## 2018-02-20 DIAGNOSIS — I10 Essential (primary) hypertension: Secondary | ICD-10-CM | POA: Diagnosis present

## 2018-02-20 LAB — HEPATIC FUNCTION PANEL
ALT: 14 U/L (ref 0–44)
AST: 27 U/L (ref 15–41)
Albumin: 3.6 g/dL (ref 3.5–5.0)
Alkaline Phosphatase: 38 U/L (ref 38–126)
BILIRUBIN DIRECT: 0.2 mg/dL (ref 0.0–0.2)
Indirect Bilirubin: 0.9 mg/dL (ref 0.3–0.9)
Total Bilirubin: 1.1 mg/dL (ref 0.3–1.2)
Total Protein: 5.7 g/dL — ABNORMAL LOW (ref 6.5–8.1)

## 2018-02-20 LAB — BASIC METABOLIC PANEL
Anion gap: 9 (ref 5–15)
Anion gap: 10 (ref 5–15)
BUN: 5 mg/dL — ABNORMAL LOW (ref 8–23)
BUN: 11 mg/dL (ref 8–23)
CHLORIDE: 107 mmol/L (ref 98–111)
CO2: 23 mmol/L (ref 22–32)
CO2: 22 mmol/L (ref 22–32)
CREATININE: 0.73 mg/dL (ref 0.44–1.00)
Calcium: 9 mg/dL (ref 8.9–10.3)
Calcium: 9.3 mg/dL (ref 8.9–10.3)
Chloride: 104 mmol/L (ref 98–111)
Creatinine, Ser: 0.81 mg/dL (ref 0.44–1.00)
GFR calc Af Amer: >60 mL/min (ref >60)
GFR calc non Af Amer: >60 mL/min (ref >60)
Glucose, Bld: 113 mg/dL — ABNORMAL HIGH (ref 70–99)
Glucose, Bld: 111 mg/dL — ABNORMAL HIGH (ref 70–99)
Potassium: 3.4 mmol/L — ABNORMAL LOW (ref 3.5–5.1)
Potassium: 4.5 mmol/L (ref 3.5–5.1)
SODIUM: 136 mmol/L (ref 135–145)
Sodium: 139 mmol/L (ref 135–145)

## 2018-02-20 LAB — MAGNESIUM: Magnesium: 2.3 mg/dL (ref 1.7–2.4)

## 2018-02-20 LAB — TSH: TSH: 2.88 u[IU]/mL (ref 0.350–4.500)

## 2018-02-20 LAB — CBC
HCT: 37 % (ref 36.0–46.0)
Hemoglobin: 12.7 g/dL (ref 12.0–15.0)
MCH: 29.8 pg (ref 26.0–34.0)
MCHC: 34.3 g/dL (ref 30.0–36.0)
MCV: 86.9 fL (ref 80.0–100.0)
NRBC: 0 % (ref 0.0–0.2)
Platelets: 148 10*3/uL — ABNORMAL LOW (ref 150–400)
RBC: 4.26 MIL/uL (ref 3.87–5.11)
RDW: 11.6 % (ref 11.5–15.5)
WBC: 5.4 10*3/uL (ref 4.0–10.5)

## 2018-02-20 LAB — ECHOCARDIOGRAM COMPLETE
Height: 60 in
Weight: 1600 oz

## 2018-02-20 LAB — TROPONIN I
Troponin I: <0.03 ng/mL (ref <0.03)
Troponin I: <0.03 ng/mL (ref <0.03)

## 2018-02-20 MED ORDER — POTASSIUM CHLORIDE CRYS ER 20 MEQ PO TBCR
20.0000 meq | EXTENDED_RELEASE_TABLET | Freq: Two times a day (BID) | ORAL | Status: DC
  Administered 2018-02-20 – 2018-02-21 (×2): 20 meq via ORAL
  Filled 2018-02-20 (×2): qty 1

## 2018-02-20 MED ORDER — SOTALOL HCL 80 MG PO TABS
80.0000 mg | ORAL_TABLET | Freq: Two times a day (BID) | ORAL | Status: DC
  Administered 2018-02-20: 80 mg via ORAL
  Filled 2018-02-20 (×2): qty 1

## 2018-02-20 MED ORDER — POTASSIUM CHLORIDE CRYS ER 20 MEQ PO TBCR: 20.0000 meq | EXTENDED_RELEASE_TABLET | Freq: Two times a day (BID) | ORAL | Status: DC

## 2018-02-20 MED ORDER — METOPROLOL TARTRATE 5 MG/5ML IV SOLN: 2.5000 mg | Freq: Four times a day (QID) | INTRAVENOUS | Status: DC | PRN

## 2018-02-20 MED ORDER — SOTALOL HCL 80 MG PO TABS
80.0000 mg | ORAL_TABLET | Freq: Two times a day (BID) | ORAL | Status: DC
  Filled 2018-02-20: qty 1

## 2018-02-20 MED ORDER — POTASSIUM CHLORIDE CRYS ER 20 MEQ PO TBCR: 20.0000 meq | EXTENDED_RELEASE_TABLET | Freq: Once | ORAL | Status: AC

## 2018-02-20 MED ORDER — DILTIAZEM HCL 60 MG PO TABS: 30.0000 mg | ORAL_TABLET | Freq: Four times a day (QID) | ORAL | Status: DC

## 2018-02-20 MED ORDER — POTASSIUM CHLORIDE CRYS ER 20 MEQ PO TBCR
20.0000 meq | EXTENDED_RELEASE_TABLET | Freq: Once | ORAL | Status: AC
  Administered 2018-02-20: 20 meq via ORAL
  Filled 2018-02-20: qty 1

## 2018-02-20 MED ORDER — POTASSIUM CHLORIDE CRYS ER 20 MEQ PO TBCR
40.0000 meq | EXTENDED_RELEASE_TABLET | Freq: Once | ORAL | Status: AC
  Administered 2018-02-20: 40 meq via ORAL
  Filled 2018-02-20: qty 2

## 2018-02-20 NOTE — Progress Notes (Signed)
Dr. Rosemary Holms asked this nurse for pt's code status any changes, because he suggested patient to be full code while she is in the hospital to try sotalol. Pt's DNR status now. Pt didn't understand fully why Dr. Rosemary Holms was talking about full code. Explained patient the reasons for changing code status. Pt understood it little bit, not convinced to make decision at this time. Make aware of night shift nurse this matter. Starting Sotalol at 1800 but K+ was 3.4 today. Pt took 40 meq po and 20 meq at 1700. Will check K+ level at 2000. Will start Sotalol if K+ >4. HS Sabra Heck

## 2018-02-20 NOTE — Consult Note (Signed)
Reason for Consult:Tachycardia Referring Physician: [facility]  [name] is an 85 y.o. female.  HPI:   85 y/o Caucasian female with hypertension, h/o CVA in 2017 with minimal left sided sensory deficit, hyperlipidemia, h/o hip replacement, h/o cholecystectomy 07/2017, now admitted with palpitations. Patient reports lightheadedness, but denies any syncope. Also denies chest pain, shortness of breath. She reports occasional leg edema, but denies orthopnea, PND.   She was started on diltiazem drip, and eliquis for Afib. Patient is in sinus rhythm in 50s at rest but continues to have episodes of tachycardia in 160s.   Past Medical History:  Diagnosis Date  . Acute ischemic stroke (HCC) 12/28/2015   [name] 12/28/2015  . Arthritis    "hands" (12/28/2015)  . GERD (gastroesophageal reflux disease)   . High cholesterol   . Hypertension   . PONV (postoperative nausea and vomiting)    "w/1st hip replacement"  . Symptomatic cholelithiasis     Past Surgical History:  Procedure Laterality Date  . CATARACT EXTRACTION W/ INTRAOCULAR LENS IMPLANT Right ~ 2011  . CHOLECYSTECTOMY N/A 08/17/2017   Procedure: LAPAROSCOPIC CHOLECYSTECTOMY WITH INTRAOPERATIVE CHOLANGIOGRAM ERAS PATHWAY;  Surgeon: Jimmye Norman, MD;  Location: [facility] OR;  Service: General;  Laterality: N/A;  . HIP CLOSED REDUCTION Right 09/29/2016   Procedure: CLOSED REDUCTION RIGHT HIP;  Surgeon: Samson Frederic, MD;  Location: [facility] OR;  Service: Orthopedics;  Laterality: Right;  . JOINT REPLACEMENT    . TOTAL HIP ARTHROPLASTY  2007-2008   left-right    Family History  Problem Relation Age of Onset  . CAD Father   . Stroke Father   . Alcohol abuse Brother   . CAD Brother   . Aneurysm Brother     Social History:  reports that she has never smoked. She has never used smokeless tobacco. She reports that she does not drink alcohol or use drugs.  Allergies:  Allergies  Allergen Reactions  . Ativan [Lorazepam] Other (See  Comments)    altered  . Glucosamine Sulfate Other (See Comments)    unknown  . Niacin Nausea And Vomiting  . Phenergan [Promethazine] Other (See Comments)    "Seeing things"    . Amoxicillin Rash    Medications: I have reviewed the patient's current medications.  Results for orders placed or performed during the hospital encounter of 02/19/18 (from the past 48 hour(s))  CBC     Status: Abnormal   Collection Time: 02/19/18  6:24 PM  Result Value Ref Range   WBC 7.7 4.0 - 10.5 K/uL   RBC 4.78 3.87 - 5.11 MIL/uL   Hemoglobin 14.2 12.0 - 15.0 g/dL   HCT 59.1 63.8 - 46.6 %   MCV 87.4 80.0 - 100.0 fL   MCH 29.7 26.0 - 34.0 pg   MCHC 34.0 30.0 - 36.0 g/dL   RDW 59.9 (L) 35.7 - 01.7 %   Platelets 179 150 - 400 K/uL   nRBC 0.0 0.0 - 0.2 %    Comment: Performed at [facility], [address], [place], [place] [address]  Comprehensive metabolic panel     Status: Abnormal   Collection Time: 02/19/18  6:24 PM  Result Value Ref Range   Sodium 138 135 - 145 mmol/L   Potassium 3.2 (L) 3.5 - 5.1 mmol/L   Chloride 102 98 - 111 mmol/L   CO2 24 22 - 32 mmol/L   Glucose, Bld 128 (H) 70 - 99 mg/dL   BUN 7 (L) 8 - 23 mg/dL  Creatinine, Ser 0.77 0.44 - 1.00 mg/dL   Calcium 9.3 8.9 - 16.110.3 mg/dL   Total Protein 6.8 6.5 - 8.1 g/dL   Albumin 4.3 3.5 - 5.0 g/dL   AST 24 15 - 41 U/L   ALT 12 0 - 44 U/L   Alkaline Phosphatase 46 38 - 126 U/L   Total Bilirubin 1.2 0.3 - 1.2 mg/dL   GFR calc non Af Amer >60 >60 mL/min   GFR calc Af Amer >60 >60 mL/min   Anion gap 12 5 - 15    Comment: Performed at [facility], [address] [place], [place] [address]  TSH     Status: None   Collection Time: 02/19/18 11:30 PM  Result Value Ref Range   TSH 2.880 0.350 - 4.500 uIU/mL    Comment: Performed by a 3rd Generation assay with a functional sensitivity of <=0.01 uIU/mL. Performed at [facility], [address] [place], [place] [address]   Troponin I - Now Then Q6H     Status: None    Collection Time: 02/19/18 11:30 PM  Result Value Ref Range   Troponin I <0.03 <0.03 ng/mL    Comment: Performed at [facility], [address] [place], [place] [address]  Basic metabolic panel     Status: Abnormal   Collection Time: 02/20/18  5:40 AM  Result Value Ref Range   Sodium 139 135 - 145 mmol/L   Potassium 3.4 (L) 3.5 - 5.1 mmol/L   Chloride 107 98 - 111 mmol/L   CO2 23 22 - 32 mmol/L   Glucose, Bld 113 (H) 70 - 99 mg/dL   BUN 5 (L) 8 - 23 mg/dL   Creatinine, Ser 7.820.73 0.44 - 1.00 mg/dL   Calcium 9.0 8.9 - 95.610.3 mg/dL   GFR calc non Af Amer >60 >60 mL/min   GFR calc Af Amer >60 >60 mL/min   Anion gap 9 5 - 15    Comment: Performed at [facility], [address] [place], [place] [address]  CBC     Status: Abnormal   Collection Time: 02/20/18  5:40 AM  Result Value Ref Range   WBC 5.4 4.0 - 10.5 K/uL   RBC 4.26 3.87 - 5.11 MIL/uL   Hemoglobin 12.7 12.0 - 15.0 g/dL   HCT 65.737.0 84.636.0 - 96.246.0 %   MCV 86.9 80.0 - 100.0 fL   MCH 29.8 26.0 - 34.0 pg   MCHC 34.3 30.0 - 36.0 g/dL   RDW 95.211.6 84.111.5 - 32.415.5 %   Platelets 148 (L) 150 - 400 K/uL   nRBC 0.0 0.0 - 0.2 %    Comment: Performed at [facility], [address] [place], [place] [address]  Troponin I - Now Then Q6H     Status: None   Collection Time: 02/20/18  5:40 AM  Result Value Ref Range   Troponin I <0.03 <0.03 ng/mL    Comment: Performed at [facility], [address] [place], [place] [address]  Hepatic function panel     Status: Abnormal   Collection Time: 02/20/18  5:40 AM  Result Value Ref Range   Total Protein 5.7 (L) 6.5 - 8.1 g/dL   Albumin 3.6 3.5 - 5.0 g/dL   AST 27 15 - 41 U/L   ALT 14 0 - 44 U/L   Alkaline Phosphatase 38 38 - 126 U/L   Total Bilirubin 1.1 0.3 - 1.2 mg/dL  Bilirubin, Direct 0.2 0.0 - 0.2 mg/dL   Indirect Bilirubin 0.9 0.3 - 0.9 mg/dL    Comment: Performed at [facility], [address] [place], [place] [address]    Dg Chest Portable 1  View  Result Date: 02/19/2018 CLINICAL DATA:  Patient complains of palpitations and cough for 2 days. EXAM: PORTABLE CHEST 1 VIEW COMPARISON:  08/07/2017 FINDINGS: The heart size and mediastinal contours are within normal limits. Both lungs are clear. The visualized skeletal structures are unremarkable. IMPRESSION: No active disease. Electronically Signed   By: Signa Kell M.D.   On: 02/19/2018 18:59    Review of Systems  Constitutional: Negative.   HENT: Negative.   Eyes: Negative.   Respiratory: Negative for shortness of breath.   Cardiovascular: Positive for palpitations. Negative for chest pain and leg swelling.  Gastrointestinal: Negative.   Genitourinary: Negative.   Musculoskeletal: Positive for joint pain.  Skin: Negative.   Neurological: Negative for loss of consciousness.  Endo/Heme/Allergies: Does not bruise/bleed easily.  Psychiatric/Behavioral: Negative.   All other systems reviewed and are negative.  Blood pressure (!) 140/45, pulse (!) 56, temperature 98.5 F (36.9 C), temperature source Oral, resp. rate 16, height 5' (1.524 m), weight 45.4 kg, SpO2 96 %. Physical Exam  Constitutional: She is oriented to person, place, and time. She appears well-developed and well-nourished.  HENT:  Head: Normocephalic and atraumatic.  Eyes: Pupils are equal, round, and reactive to light. Conjunctivae are normal.  Neck: No JVD present.  Cardiovascular: Regular rhythm, normal heart sounds and intact distal pulses.  No murmur heard. Respiratory: Effort normal and breath sounds normal. She has no wheezes. She has no rales.  GI: Soft. Bowel sounds are normal. There is no abdominal tenderness.  Musculoskeletal:        General: No edema.  Lymphadenopathy:    She has no cervical adenopathy.  Neurological: She is alert and oriented to person, place, and time.  Skin: Skin is warm and dry.  Psychiatric: She has a normal mood and affect.   Cardiac studies: EKG 02/19/2018 &  Telemetry: Sinus rhythm with episodes of narrow complex tachycardia, likely AVNRT. Sinus pauses <2sec when converting out of AVNRT.  Echocardiogram 02/20/2018: - Left ventricle: The cavity size was normal. Systolic function was   vigorous. The estimated ejection fraction was in the range of 65%   to 70%. Wall motion was normal; there were no regional wall   motion abnormalities. Doppler parameters are consistent with   abnormal left ventricular relaxation (grade 1 diastolic   dysfunction). - Aortic valve: There was mild regurgitation. - Pericardium, extracardiac: A small to moderate circumferential   pericardial effusion without tamponade physiology. - No significant change compared to previous study in 2017.    Assessment/Plan: 85 y/o Caucasian female with hypertension, h/o CVA in 2017 with minimal left sided sensory deficit, hyperlipidemia, h/o hip replacement, h/o cholecystectomy 07/2017, now admitted with palpitations.   Palpitations: Reviewed EKG's and telemetry in detail. 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 In sinus rhythm, her rate is in 50s. She does have pauses <2 sec when she converts out of narrow complex tachycardia. I dicussed the options of ablation vs medical therapy with the patient. She opted for the latter.  EKG while in AVNRT shows inferolateral ST depressions s/o ischemia, without any troponin elevation. It is possible that she may have underlying subclinical CAD. Thus, I would avoid flecainide. Recommend stopping  all AV nodal blocking agents, and start sotalol 80 mg bid at 6 pm. Keep K >4, Mg >2.  At this time, recommend stopping eliquis, unless definitive evidence of Afib emerges.  Pericardial effusion: Small to moderate, stable when compared to echocardiogram in 2017  [facility] J Mikya Don 02/20/2018, 12:13 PM   Shunsuke Granzow Emiliano DyerJ Anne Sebring, MD [facility].  [place] Pager: [phone] Office: [phone] If no answer Cell [phone]

## 2018-02-20 NOTE — Progress Notes (Addendum)
PROGRESS NOTE    [name]  ZSM:[id number] DOB: [birth year] DOA: 02/19/2018 PCP: Roger Kill, PA-C    Brief Narrative:  [name] is a 85 y.o. female with history of stroke, hypertension, hyperlipidemia presents to the ER with subjective feeling of palpitations over the last 3 to 4 days.  Denies any chest pain or shortness of breath at times feels dizzy.  Did not lose consciousness.  ED Course: In the ER patient was found to be in A. fib with RVR and was initially started on Cardizem infusion soon converted back to normal sinus rhythm patient was started on apixaban and plan was to discharge home.  But patient converted back to A. fib with RVR and Cardizem infusion was restarted.  Patient admitted for further observation for A. fib with RVR.   Assessment & Plan:   Principal Problem:   Atrial fibrillation with RVR (HCC) Active Problems:   HTN (hypertension)   CVA (cerebral vascular accident) (HCC)   1. A. fib with RVR -on Cardizem infusion.  Patient is also on atenolol.  Per the nursing staff whenever they try to take her off the Cardizem infusion overnight with the patient is ambulating the heart rate is going back up.  Cardizem drip was briefly turned off but this morning she was very tachycardic therefore had to be restarted.  We will start her on p.o. Cardizem in the hope of weaning her off the IV Cardizem drip.  Will discontinue the Norvasc because of low diastolic blood pressure.  Continue to monitor.  If she patient continues to remain tachycardic will consider consulting cardiology.  Started on apixaban.  2D echo ordered.  Thyroid function test is normal.    Troponin negative.  She denies having any chest pain. 2. Hypertension: On amlodipine and atenolol.  However, diastolic blood pressure is low.  Will discontinue the Norvasc.  Started on p.o. Cardizem.  Continue monitor blood pressure closely and adjust medications as needed. 3. History of stroke on statins and  presently apixaban. Was not taking Aspirin at home. 4. Hypokalemia: Potassium replacement ordered.  Will also check magnesium level.   DVT prophylaxis: Eliquis Code Status: DNR Family Communication: No family at bedside Disposition Plan: Home when stable   Consultants:   Cardiology  Procedures:   None  Antimicrobials:   None   Subjective: Complaining of palpitations, generalized weakness when the heart rate goes up.  Denies having any chest pain.  Objective: Vitals:   02/20/18 0530 02/20/18 0600 02/20/18 0630 02/20/18 0844  BP: (!) 135/46 (!) 123/47 (!) 127/41   Pulse: (!) 58 (!) 59 (!) 56 61  Resp: 14 11 14 16   Temp:      TempSrc:      SpO2: 94% 96% 95% 99%  Weight:      Height:        Intake/Output Summary (Last 24 hours) at 02/20/2018 0934 Last data filed at 02/19/2018 2300 Gross per 24 hour  Intake -  Output 425 ml  Net -425 ml   Filed Weights   02/19/18 1804  Weight: 45.4 kg    Examination:  General exam: Appears calm and comfortable  Respiratory system: Clear to auscultation. Respiratory effort normal. Cardiovascular system: S1 & S2 heard, RRR. No JVD, murmurs, rubs, or clicks. No pedal edema. Gastrointestinal system: Abdomen is nondistended, soft and nontender. No organomegaly or masses felt. Normal bowel sounds heard. Central nervous system: Alert and oriented. No focal neurological deficits. Extremities: Symmetric 5 x 5 power.  Skin: No rashes, lesions or ulcers Psychiatry: Judgement and insight appear normal. Mood & affect appropriate.     Data Reviewed: I have personally reviewed following labs and imaging studies  CBC: Recent Labs  Lab 02/19/18 1824 02/20/18 0540  WBC 7.7 5.4  HGB 14.2 12.7  HCT 41.8 37.0  MCV 87.4 86.9  PLT 179 148*   Basic Metabolic Panel: Recent Labs  Lab 02/19/18 1824 02/20/18 0540  NA 138 139  K 3.2* 3.4*  CL 102 107  CO2 24 23  GLUCOSE 128* 113*  BUN 7* 5*  CREATININE 0.77 0.73  CALCIUM 9.3 9.0    GFR: Estimated Creatinine Clearance: 38.9 mL/min (by C-G formula based on SCr of 0.73 mg/dL). Liver Function Tests: Recent Labs  Lab 02/19/18 1824 02/20/18 0540  AST 24 27  ALT 12 14  ALKPHOS 46 38  BILITOT 1.2 1.1  PROT 6.8 5.7*  ALBUMIN 4.3 3.6   No results for input(s): LIPASE, AMYLASE in the last 168 hours. No results for input(s): AMMONIA in the last 168 hours. Coagulation Profile: No results for input(s): INR, PROTIME in the last 168 hours. Cardiac Enzymes: Recent Labs  Lab 02/19/18 2330 02/20/18 0540  TROPONINI <0.03 <0.03   BNP (last 3 results) No results for input(s): PROBNP in the last 8760 hours. HbA1C: No results for input(s): HGBA1C in the last 72 hours. CBG: No results for input(s): GLUCAP in the last 168 hours. Lipid Profile: No results for input(s): CHOL, HDL, LDLCALC, TRIG, CHOLHDL, LDLDIRECT in the last 72 hours. Thyroid Function Tests: Recent Labs    02/19/18 2330  TSH 2.880   Anemia Panel: No results for input(s): VITAMINB12, FOLATE, FERRITIN, TIBC, IRON, RETICCTPCT in the last 72 hours. Sepsis Labs: No results for input(s): PROCALCITON, LATICACIDVEN in the last 168 hours.  No results found for this or any previous visit (from the past 240 hour(s)).       Radiology Studies: Dg Chest Portable 1 View  Result Date: 02/19/2018 CLINICAL DATA:  Patient complains of palpitations and cough for 2 days. EXAM: PORTABLE CHEST 1 VIEW COMPARISON:  08/07/2017 FINDINGS: The heart size and mediastinal contours are within normal limits. Both lungs are clear. The visualized skeletal structures are unremarkable. IMPRESSION: No active disease. Electronically Signed   By: Signa Kell M.D.   On: 02/19/2018 18:59        Scheduled Meds: . amLODipine  10 mg Oral Daily  . apixaban  2.5 mg Oral BID  . atenolol  50 mg Oral Daily  . latanoprost  1 drop Both Eyes QHS  . multivitamin with minerals  1 tablet Oral Daily  . pantoprazole  40 mg Oral Daily  .  pneumococcal 23 valent vaccine  0.5 mL Intramuscular Tomorrow-1000  . rosuvastatin  5 mg Oral q1800   Continuous Infusions: . diltiazem (CARDIZEM) infusion 2.5 mg/hr (02/20/18 0901)     LOS: 0 days    Time spent: 25 min    Vonzella Nipple, MD [facility] Pager on amion  If 7PM-7AM, please contact night-coverage [email or web address] Password [facility] 02/20/2018, 9:34 AM

## 2018-02-20 NOTE — Progress Notes (Signed)
  Echocardiogram 2D Echocardiogram has been performed.  Leta Jungling M 02/20/2018, 12:22 PM

## 2018-02-21 DIAGNOSIS — I471 Supraventricular tachycardia: Principal | ICD-10-CM

## 2018-02-21 DIAGNOSIS — I10 Essential (primary) hypertension: Secondary | ICD-10-CM

## 2018-02-21 DIAGNOSIS — I63331 Cerebral infarction due to thrombosis of right posterior cerebral artery: Secondary | ICD-10-CM

## 2018-02-21 LAB — CBC
HCT: 38.2 % (ref 36.0–46.0)
Hemoglobin: 13 g/dL (ref 12.0–15.0)
MCH: 29.7 pg (ref 26.0–34.0)
MCHC: 34 g/dL (ref 30.0–36.0)
MCV: 87.4 fL (ref 80.0–100.0)
Platelets: 163 10*3/uL (ref 150–400)
RBC: 4.37 MIL/uL (ref 3.87–5.11)
RDW: 11.6 % (ref 11.5–15.5)
WBC: 6.1 10*3/uL (ref 4.0–10.5)
nRBC: 0 % (ref 0.0–0.2)

## 2018-02-21 LAB — BASIC METABOLIC PANEL
Anion gap: 8 (ref 5–15)
BUN: 14 mg/dL (ref 8–23)
CO2: 23 mmol/L (ref 22–32)
CREATININE: 0.9 mg/dL (ref 0.44–1.00)
Calcium: 9.3 mg/dL (ref 8.9–10.3)
Chloride: 104 mmol/L (ref 98–111)
GFR calc Af Amer: >60 mL/min (ref >60)
GFR calc non Af Amer: 60 mL/min — ABNORMAL LOW (ref >60)
Glucose, Bld: 106 mg/dL — ABNORMAL HIGH (ref 70–99)
Potassium: 5 mmol/L (ref 3.5–5.1)
Sodium: 135 mmol/L (ref 135–145)

## 2018-02-21 MED ORDER — AMLODIPINE BESYLATE 5 MG PO TABS
5.0000 mg | ORAL_TABLET | Freq: Every day | ORAL | Status: DC
  Administered 2018-02-21: 5 mg via ORAL
  Filled 2018-02-21: qty 1

## 2018-02-21 MED ORDER — AMLODIPINE BESYLATE 5 MG PO TABS: 5.0000 mg | ORAL_TABLET | Freq: Every day | ORAL | 1 refills | Status: DC

## 2018-02-21 MED ORDER — ACETAMINOPHEN 325 MG PO TABS: 650.0000 mg | ORAL_TABLET | Freq: Four times a day (QID) | ORAL | 0 refills | Status: DC | PRN

## 2018-02-21 MED ORDER — SOTALOL HCL 80 MG PO TABS: 80.0000 mg | ORAL_TABLET | Freq: Every day | ORAL | 1 refills | Status: DC

## 2018-02-21 MED ORDER — SOTALOL HCL 80 MG PO TABS
80.0000 mg | ORAL_TABLET | Freq: Every day | ORAL | Status: DC
  Administered 2018-02-21: 80 mg via ORAL
  Filled 2018-02-21: qty 1

## 2018-02-21 MED ORDER — ASPIRIN EC 81 MG PO TBEC: 81.0000 mg | DELAYED_RELEASE_TABLET | Freq: Every day | ORAL | 2 refills | Status: AC

## 2018-02-21 NOTE — Discharge Summary (Addendum)
Physician Discharge Summary  [name] ZMO:[id number] DOB: [birth year] DOA: 02/19/2018  PCP: Roger Kill, PA-C  Admit date: 02/19/2018 Discharge date: 02/21/2018 Consultations: Cardiology Dr Rosemary Holms  Admitted From: home Disposition: home  Discharge Diagnoses:  Principal Problem:   Paroxysmal SVT (supraventricular tachycardia) (HCC) Active Problems:   HTN (hypertension)   CVA (cerebral vascular accident) (HCC)   Brief/Interim Summary: Kristen Settle Clarkis a 85 y.o.femalewithhistory of stroke with residual left-sided sensory deficits, hypertension, hyperlipidemia presents to the ER with subjective feeling of palpitations over the last 3 to 4 days.  ED Course:In the ER patient was found to be in A. fib with RVR and was initially started on Cardizem infusion soon converted back to normal sinus rhythm patient was started on apixaban and plan was to discharge home. But patient converted back to A. fib with RVR and Cardizem infusion was restarted. Patient admitted for further observation for presumed A. fib with RVR.    Hospital course: Since admission, patient had episodes of intermittent tachycardia and could not be completely weaned off Cardizem.  Cardiology evaluation was obtained and on detailed review of telemetry strips/EKGs cardiology felt the patient's arrhythmia represented SVT/AV nodal reentrant tachycardia and not indicative of A. fib.  AV ablation option was offered to patient but she refused.  She was started on oral sotalol per cardiology and she has not had any further tachycardia episodes.EKG shows stable QTC at 458 ms prior to discharge.  TSH is normal.  Her potassium level was slightly low when she was started on oral supplements but currently potassium level up to 5.  Will DC supplementation at this time.  Magnesium level has remained about 2.  Echo shows normal wall motion and EF of 65 to 70%.  Patient has been cleared by cardiology for discharge this afternoon and  she feels good to go home. Her blood pressure has been well controlled on sotalol and low-dose Norvasc.Per cardiology, anticoagulation can be discontinued at this point and okay to resume baby aspirin for CVA prophylaxis.  She will be followed up in cardiology clinic within a week at which time repeat EKG will be obtained.   Discharge Exam: Vitals:   02/21/18 0900 02/21/18 1150  BP: (!) 124/56 128/65  Pulse: (!) 56 74  Resp: 15 20  Temp:  97.8 F (36.6 C)  SpO2: 97% 98%   Vitals:   02/21/18 0500 02/21/18 0713 02/21/18 0900 02/21/18 1150  BP: (!) 136/44 (!) 134/57 (!) 124/56 128/65  Pulse: 60 (!) 57 (!) 56 74  Resp: 15 15 15 20   Temp:  98.6 F (37 C)  97.8 F (36.6 C)  TempSrc:  Oral  Axillary  SpO2: 97% 97% 97% 98%  Weight:      Height:        General: Pt is alert, awake, not in acute distress Cardiovascular: RRR, S1/S2 +, no rubs, no gallops Respiratory: CTA bilaterally, no wheezing, no rhonchi Abdominal: Soft, NT, ND, bowel sounds + Extremities: no edema, no cyanosis  Discharge Instructions  Discharge Instructions    Amb referral to AFIB Clinic   Complete by:  As directed    Call MD for:  difficulty breathing, headache or visual disturbances   Complete by:  As directed    Call MD for:  extreme fatigue   Complete by:  As directed    Call MD for:  persistant dizziness or light-headedness   Complete by:  As directed    Call MD for:  persistant nausea and vomiting  Complete by:  As directed    Call MD for:  severe uncontrolled pain   Complete by:  As directed    Call MD for:  temperature >100.4   Complete by:  As directed    Diet - low sodium heart healthy   Complete by:  As directed    Increase activity slowly   Complete by:  As directed      Allergies as of 02/21/2018      Reactions   Ativan [lorazepam] Other (See Comments)   altered   Glucosamine Sulfate Other (See Comments)   unknown   Niacin Nausea And Vomiting   Phenergan [promethazine] Other (See  Comments)   "Seeing things"     Amoxicillin Rash      Medication List    STOP taking these medications   aspirin 325 MG tablet Replaced by:  aspirin EC 81 MG tablet   atenolol 50 MG tablet Commonly known as:  TENORMIN   oxyCODONE 5 MG immediate release tablet Commonly known as:  Oxy IR/ROXICODONE   traMADol 50 MG tablet Commonly known as:  ULTRAM     TAKE these medications   acetaminophen 325 MG tablet Commonly known as:  TYLENOL Take 2 tablets (650 mg total) by mouth every 6 (six) hours as needed for mild pain (or Fever >/= 101).   amLODipine 5 MG tablet Commonly known as:  NORVASC Take 1 tablet (5 mg total) by mouth daily. Start taking on:  February 22, 2018 What changed:    medication strength  how much to take   aspirin EC 81 MG tablet Take 1 tablet (81 mg total) by mouth daily. Replaces:  aspirin 325 MG tablet   CALCIUM 600+D3 PO Take 1 tablet by mouth 2 (two) times daily.   FISH OIL PO Take 1 tablet by mouth daily.   latanoprost 0.005 % ophthalmic solution Commonly known as:  XALATAN Place 1 drop into both eyes at bedtime.   MULTI-VITAMINS Tabs Take 1 tablet by mouth daily.   ondansetron 4 MG disintegrating tablet Commonly known as:  ZOFRAN ODT Take 1 tablet (4 mg total) by mouth every 8 (eight) hours as needed for nausea or vomiting.   oxyCODONE-acetaminophen 5-325 MG tablet Commonly known as:  PERCOCET/ROXICET Take 0.5 tablets by mouth every 6 (six) hours as needed for severe pain.   pantoprazole 40 MG tablet Commonly known as:  PROTONIX Take 1 tablet (40 mg total) by mouth daily.   rosuvastatin 5 MG tablet Commonly known as:  CRESTOR Take 1 tablet (5 mg total) by mouth daily at 6 PM.   sotalol 80 MG tablet Commonly known as:  BETAPACE Take 1 tablet (80 mg total) by mouth daily. Start taking on:  February 22, 2018      Follow-up Information    Roger Kill, [place].   Specialty:  Physician Assistant Contact information: [address] [place] [address] [phone]        [facility] EMERGENCY DEPARTMENT.   Specialty:  Emergency Medicine Why:  Return to ER for any new or worsening symptoms Contact information: [address] [id number] [place]c [address] [place] [address] [phone]       Elder Negus, MD Follow up on 03/01/2018.   Specialty:  Cardiology Why:  11:00 AM Contact information: [address] [place] [address] [phone]          Allergies  Allergen Reactions  . Ativan [Lorazepam] Other (See Comments)  altered  . Glucosamine Sulfate Other (See Comments)    unknown  . Niacin Nausea And Vomiting  . Phenergan [Promethazine] Other (See Comments)    "Seeing things"    . Amoxicillin Rash       Discharge Condition: Stable CODE STATUS: DNR Diet recommendation: Cardiac diet Recommendations for Outpatient Follow-up:  1. Follow up with PCP and Cardiology clinic in 5-6 days 2. Please obtain BMP/EKG for Qtc f/u in one week      The results of significant diagnostics from this hospitalization (including imaging, microbiology, ancillary and laboratory) are listed below for reference.     Microbiology: No results found for this or any previous visit (from the past 240 hour(s)).   Labs: BNP (last 3 results) No results for input(s): BNP in the last 8760 hours. Basic Metabolic Panel: Recent Labs  Lab 02/19/18 1824 02/20/18 0540 02/20/18 1150 02/20/18 2000 02/21/18 0243  NA 138 139  --  136 135  K 3.2* 3.4*  --  4.5 5.0  CL 102 107  --  104 104  CO2 24 23  --  22 23  GLUCOSE 128* 113*  --  111* 106*  BUN 7* 5*  --  11 14  CREATININE 0.77 0.73  --  0.81 0.90  CALCIUM 9.3 9.0  --  9.3 9.3  MG  --   --  2.3  --   --    Liver Function Tests: Recent Labs  Lab 02/19/18 1824 02/20/18 0540  AST 24 27  ALT 12 14  ALKPHOS 46 38  BILITOT 1.2 1.1  PROT 6.8 5.7*  ALBUMIN 4.3 3.6   No results for input(s): LIPASE,  AMYLASE in the last 168 hours. No results for input(s): AMMONIA in the last 168 hours. CBC: Recent Labs  Lab 02/19/18 1824 02/20/18 0540 02/21/18 0243  WBC 7.7 5.4 6.1  HGB 14.2 12.7 13.0  HCT 41.8 37.0 38.2  MCV 87.4 86.9 87.4  PLT 179 148* 163   Cardiac Enzymes: Recent Labs  Lab 02/19/18 2330 02/20/18 0540 02/20/18 1150  TROPONINI <0.03 <0.03 <0.03   BNP: Invalid input(s): POCBNP CBG: No results for input(s): GLUCAP in the last 168 hours. D-Dimer No results for input(s): DDIMER in the last 72 hours. Hgb A1c No results for input(s): HGBA1C in the last 72 hours. Lipid Profile No results for input(s): CHOL, HDL, LDLCALC, TRIG, CHOLHDL, LDLDIRECT in the last 72 hours. Thyroid function studies Recent Labs    02/19/18 2330  TSH 2.880   Anemia work up No results for input(s): VITAMINB12, FOLATE, FERRITIN, TIBC, IRON, RETICCTPCT in the last 72 hours. Urinalysis    Component Value Date/Time   COLORURINE COLORLESS (A) 08/16/2017 1840   APPEARANCEUR CLEAR 08/16/2017 1840   LABSPEC 1.002 (L) 08/16/2017 1840   PHURINE 7.0 08/16/2017 1840   GLUCOSEU NEGATIVE 08/16/2017 1840   HGBUR NEGATIVE 08/16/2017 1840   BILIRUBINUR NEGATIVE 08/16/2017 1840   KETONESUR 5 (A) 08/16/2017 1840   PROTEINUR NEGATIVE 08/16/2017 1840   NITRITE NEGATIVE 08/16/2017 1840   LEUKOCYTESUR NEGATIVE 08/16/2017 1840   Sepsis Labs Invalid input(s): PROCALCITONIN,  WBC,  LACTICIDVEN Microbiology No results found for this or any previous visit (from the past 240 hour(s)).  Procedures/Studies: Dg Chest Portable 1 View  Result Date: 02/19/2018 CLINICAL DATA:  Patient complains of palpitations and cough for 2 days. EXAM: PORTABLE CHEST 1 VIEW COMPARISON:  08/07/2017 FINDINGS: The heart size and mediastinal contours are within normal limits. Both lungs are clear. The visualized skeletal structures  are unremarkable. IMPRESSION: No active disease. Electronically Signed   By: Signa Kellaylor  Stroud M.D.   On:  02/19/2018 18:59   (Echo, Carotid, EGD, Colonoscopy, ERCP)  Time coordinating discharge: Over 30 minutes  SIGNED:   Alessandra BevelsNeelima Nakeya Adinolfi, MD  [facility] 02/21/2018, 3:00 PM Pager   If 7PM-7AM, please contact night-coverage [email or web address] Password [email or web address]

## 2018-02-21 NOTE — Progress Notes (Signed)
Subjective:  Denies chest pain, shortness of breath. Feels "her head doesn't feel right". Denies any headache, new weakness, lack of sensation etc.   Objective:  Vital Signs in the last 24 hours: Temp:  [98.2 F (36.8 C)-99.2 F (37.3 C)] 98.6 F (37 C) (01/23 0713) Pulse Rate:  [48-65] 57 (01/23 0713) Resp:  [13-18] 15 (01/23 0713) BP: (106-140)/(44-76) 134/57 (01/23 0713) SpO2:  [93 %-100 %] 97 % (01/23 0713)  Intake/Output from previous day: 01/22 0701 - 01/23 0700 In: 240 [P.O.:240] Out: 400 [Urine:400] Intake/Output from this shift: Total I/O In: 400 [P.O.:400] Out: -   Physical Exam: Constitutional: She is oriented to person, place, and time. She appears well-developed and well-nourished.  HENT:  Head: Normocephalic and atraumatic.  Eyes: Pupils are equal, round, and reactive to light. Conjunctivae are normal.  Neck: No JVD present.  Cardiovascular: Regular rhythm, normal heart sounds and intact distal pulses.  No murmur heard. Respiratory: Effort normal and breath sounds normal. She has no wheezes. She has no rales.  GI: Soft. Bowel sounds are normal. There is no abdominal tenderness.  Musculoskeletal:        General: No edema.  Lymphadenopathy:    She has no cervical adenopathy.  Neurological: She is alert and oriented to person, place, and time.  Skin: Skin is warm and dry.  Psychiatric: She has a normal mood and affect.   Lab Results: Recent Labs    02/20/18 0540 02/21/18 0243  WBC 5.4 6.1  HGB 12.7 13.0  PLT 148* 163   Recent Labs    02/20/18 2000 02/21/18 0243  NA 136 135  K 4.5 5.0  CL 104 104  CO2 22 23  GLUCOSE 111* 106*  BUN 11 14  CREATININE 0.81 0.90   Recent Labs    02/20/18 0540 02/20/18 1150  TROPONINI <0.03 <0.03   Hepatic Function Panel Recent Labs    02/20/18 0540  PROT 5.7*  ALBUMIN 3.6  AST 27  ALT 14  ALKPHOS 38  BILITOT 1.1  BILIDIR 0.2  IBILI 0.9   Cardiac studies: EKG 02/21/2018: Sinus rhythm 59 bpm.  Normal QTc interval.  Telemetry snows no recurrence of tachyarrhtymias/ bradyarrhythmias.   Echocardiogram 02/20/2018: - Left ventricle: The cavity size was normal. Systolic function was vigorous. The estimated ejection fraction was in the range of 65% to 70%. Wall motion was normal; there were no regional wall motion abnormalities. Doppler parameters are consistent with abnormal left ventricular relaxation (grade 1 diastolic dysfunction). - Aortic valve: There was mild regurgitation. - Pericardium, extracardiac: A small to moderate circumferential pericardial effusion without tamponade physiology. - No significant change compared to previous study in 2017.  Assessment/Plan:   85 y/o Caucasian female with hypertension, h/o CVA in 2017 with minimal left sided sensory deficit, hyperlipidemia, h/o hip replacement, h/o cholecystectomy 07/2017, now admitted with palpitations.   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 Telemetry does not show any other arrhythmias. Tolerating sotalol well. Recommend 80 mg once daily, given her creatinine clearance. Repeat EKG at 1 PM today. If QTc interval normal, and no recurrence, could be discharged later today from cardiac standpoint.  Keep K >4, Mg >2.   Pericardial effusion: Small to moderate, stable when compared to echocardiogram in 2017  Hypertension: Atenolol discontinued. Avoid AV nodal blocking agents, as she is now on sotalol.  I started amlodipine 5 mg today. She is on 10 mg at home. Defer discharge dose to primary team.  H/o CVA: States she "doesn't feel right in her  head". No new neurodeficits. Monitor for now.    LOS: 1 day    [name] J Brighid Koch 02/21/2018, 9:18 AM  Filippo Puls Emiliano Dyer, MD [facility] Pager: [phone] Office: [phone] If no answer Cell [phone]

## 2018-02-21 NOTE — Care Management (Signed)
#    2.   Kristine RoyalS/ W  COURTNEY @  [facility] # [id number]   1. BETAPACE  80 MG BID COVER- NOT COVER PRIOR APPROVAL- YES # [phone]   GENERIC: 2 SOTALOL- HYDROCHLORIDE  80 MG BID COVER- YES CO-PAY- $ 20.00 TIER-  2 DRUG PRIOR APPROVAL- NO 90 DAY SUPPLY FOR M/O  ZERO DOLLARS  PREFERRED PHARMACY : YES [facility] AND [facility] M/[facility]

## 2018-02-21 NOTE — Progress Notes (Signed)
Patient discharged home with daughter. Medications and discharge instructions reviewed with questions addressed. Patient and caregiver verbalized understanding of new medications and scheduled appointments. Patient to follow up with primary care and with cardiology. Patient wheeled to daughter's vehicle.

## 2018-02-21 NOTE — Progress Notes (Signed)
EKG 13:02 hrs reviewed. I personally calculated QTc interval=458 msec. Continue sotalol 80 mg once daily.  Elder Negus, MD [facility] Pager: [phone] Office: [phone] If no answer Cell [phone]

## 2018-02-21 NOTE — Plan of Care (Signed)
Problem: Clinical Measurements: Goal: Ability to maintain clinical measurements within normal limits will improve Outcome: Progressing Goal: Diagnostic test results will improve Outcome: Progressing Goal: Cardiovascular complication will be avoided Outcome: Progressing   Problem: Activity: Goal: Ability to tolerate increased activity will improve Outcome: Progressing   Problem: Education: Goal: Knowledge of General Education information will improve Description Including pain rating scale, medication(s)/side effects and non-pharmacologic comfort measures Outcome: Completed/Met   Problem: Nutrition: Goal: Adequate nutrition will be maintained Outcome: Completed/Met   Problem: Safety: Goal: Ability to remain free from injury will improve Outcome: Completed/Met   Problem: Skin Integrity: Goal: Risk for impaired skin integrity will decrease Outcome: Completed/Met

## 2018-02-23 ENCOUNTER — Encounter (HOSPITAL_COMMUNITY): Payer: Self-pay | Admitting: Emergency Medicine

## 2018-02-23 ENCOUNTER — Other Ambulatory Visit: Payer: Self-pay

## 2018-02-23 ENCOUNTER — Emergency Department (HOSPITAL_COMMUNITY)
Admission: EM | Admit: 2018-02-23 | Discharge: 2018-02-23 | Disposition: A | Discharge status: Home / Self Care | Payer: Medicare Other | Attending: Emergency Medicine | Admitting: Emergency Medicine

## 2018-02-23 DIAGNOSIS — R002 Palpitations: Secondary | ICD-10-CM | POA: Insufficient documentation

## 2018-02-23 DIAGNOSIS — I10 Essential (primary) hypertension: Secondary | ICD-10-CM | POA: Insufficient documentation

## 2018-02-23 DIAGNOSIS — Z79899 Other long term (current) drug therapy: Secondary | ICD-10-CM | POA: Insufficient documentation

## 2018-02-23 NOTE — Discharge Instructions (Addendum)
Continue your home medication.  Follow-up with cardiology as scheduled.  Return to the ED if symptoms are persistent.

## 2018-02-23 NOTE — ED Notes (Signed)
Patient verbalizes understanding of discharge instructions. Opportunity for questioning and answers were provided. Armband removed by staff, pt discharged from ED. Ambulated out to lobby with family ? ?

## 2018-02-23 NOTE — ED Triage Notes (Addendum)
Pt in from home with c/o palpitations since last night. States she was recently here for SVT and Afib RVR and started on Sotalol x 2 days. Denies any cp, is c/o nausea. Is to f/u with Cards on Friday, NSR on monitor. Also c/o bilateral neck pain

## 2018-02-23 NOTE — ED Provider Notes (Signed)
[facility] EMERGENCY DEPARTMENT Provider Note   CSN: [id number] Arrival date & time: 02/23/18  5374     History   Chief Complaint Chief Complaint  Patient presents with  . Palpitations    HPI [name] is a 85 y.o. female.  The history is provided by the patient.  Palpitations  Palpitations quality:  Fast Onset quality:  At rest Timing:  Sporadic Progression:  Resolved Chronicity:  Recurrent Context: not anxiety   Relieved by: sotalol. Worsened by:  Nothing Associated symptoms: no back pain, no chest pain, no chest pressure, no cough, no diaphoresis, no dizziness, no hemoptysis, no leg pain, no lower extremity edema, no nausea, no shortness of breath, no syncope, no vomiting and no weakness   Risk factors: no hyperthyroidism     Past Medical History:  Diagnosis Date  . Acute ischemic stroke (HCC) 12/28/2015   [name] 12/28/2015  . Arthritis    "hands" (12/28/2015)  . GERD (gastroesophageal reflux disease)   . High cholesterol   . Hypertension   . PONV (postoperative nausea and vomiting)    "w/1st hip replacement"  . Symptomatic cholelithiasis     Patient Active Problem List   Diagnosis Date Noted  . Paroxysmal SVT (supraventricular tachycardia) (HCC) 02/21/2018  . Hyperlipidemia 08/28/2016  . AKI (acute kidney injury) (HCC)   . Frequent stools   . Sleep disturbance   . Hemiparesis of right nondominant side due to cerebral infarction   . Benign essential HTN   . 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 Ataxia, late effect of cerebrovascular disease   . Alteration of sensation as late effect of stroke   . CVA (cerebral vascular accident) (HCC) 12/29/2015  . Left sided numbness 12/28/2015  . Nausea & vomiting 12/28/2015  . Diarrhea 12/28/2015  . HTN (hypertension) 12/28/2015  . Acute encephalopathy 12/23/2015  . UTI (urinary tract infection) 12/23/2015  . Hyponatremia 12/23/2015  . Dehydration with hyponatremia 12/23/2015     Past Surgical History:  Procedure Laterality Date  . CATARACT EXTRACTION W/ INTRAOCULAR LENS IMPLANT Right ~ 2011  . CHOLECYSTECTOMY N/A 08/17/2017   Procedure: LAPAROSCOPIC CHOLECYSTECTOMY WITH INTRAOPERATIVE CHOLANGIOGRAM ERAS PATHWAY;  Surgeon: Jimmye Norman, MD;  Location: [facility] OR;  Service: General;  Laterality: N/A;  . HIP CLOSED REDUCTION Right 09/29/2016   Procedure: CLOSED REDUCTION RIGHT HIP;  Surgeon: Samson Frederic, MD;  Location: [facility] OR;  Service: Orthopedics;  Laterality: Right;  . JOINT REPLACEMENT    . TOTAL HIP ARTHROPLASTY  2007-2008   left-right     OB History   No obstetric history on file.      Home Medications    Prior to Admission medications   Medication Sig Start Date End Date Taking? Authorizing Provider  acetaminophen (TYLENOL) 325 MG tablet Take 2 tablets (650 mg total) by mouth every 6 (six) hours as needed for mild pain (or Fever >/= 101). 02/21/18   Alessandra Bevels, MD  amLODipine (NORVASC) 5 MG tablet Take 1 tablet (5 mg total) by mouth daily. 02/22/18   Alessandra Bevels, MD  aspirin EC 81 MG tablet Take 1 tablet (81 mg total) by mouth daily. 02/21/18 02/21/19  Alessandra Bevels, MD  Calcium Carb-Cholecalciferol (CALCIUM 600+D3 PO) Take 1 tablet by mouth 2 (two) times daily.    [provider]  latanoprost (XALATAN) 0.005 % ophthalmic solution Place 1 drop into both eyes at bedtime.    [provider]  Multiple Vitamin (MULTI-VITAMINS) TABS Take 1 tablet by mouth daily.  [provider]  Omega-3 Fatty Acids (FISH OIL PO) Take 1 tablet by mouth daily.    [provider]  ondansetron (ZOFRAN ODT) 4 MG disintegrating tablet Take 1 tablet (4 mg total) by mouth every 8 (eight) hours as needed for nausea or vomiting. 08/16/17   Cecille PoMacklin, Nicholas W, MD  oxyCODONE-acetaminophen (PERCOCET/ROXICET) 5-325 MG tablet Take 0.5 tablets by mouth every 6 (six) hours as needed for severe pain. 08/16/17   Cecille PoMacklin, Nicholas W, MD   pantoprazole (PROTONIX) 40 MG tablet Take 1 tablet (40 mg total) by mouth daily. 01/06/16   Angiulli, Mcarthur Rossettianiel J, PA-C  rosuvastatin (CRESTOR) 5 MG tablet Take 1 tablet (5 mg total) by mouth daily at 6 PM. 01/06/16   Angiulli, Mcarthur Rossettianiel J, PA-C  sotalol (BETAPACE) 80 MG tablet Take 1 tablet (80 mg total) by mouth daily. 02/22/18   Alessandra BevelsKamineni, Neelima, MD    Family History Family History  Problem Relation Age of Onset  . CAD Father   . Stroke Father   . Alcohol abuse Brother   . CAD Brother   . Aneurysm Brother     Social History Social History   Tobacco Use  . Smoking status: Never Smoker  . Smokeless tobacco: Never Used  Substance Use Topics  . Alcohol use: No  . Drug use: No     Allergies   Ativan [lorazepam]; Glucosamine sulfate; Niacin; Phenergan [promethazine]; and Amoxicillin   Review of Systems Review of Systems  Constitutional: Negative for chills, diaphoresis and fever.  HENT: Negative for ear pain and sore throat.   Eyes: Negative for pain and visual disturbance.  Respiratory: Negative for cough, hemoptysis and shortness of breath.   Cardiovascular: Positive for palpitations. Negative for chest pain and syncope.  Gastrointestinal: Negative for abdominal pain, nausea and vomiting.  Genitourinary: Negative for dysuria and hematuria.  Musculoskeletal: Negative for arthralgias and back pain.  Skin: Negative for color change and rash.  Neurological: Negative for dizziness, seizures, syncope and weakness.  All other systems reviewed and are negative.    Physical Exam Updated Vital Signs BP (!) 162/55   Pulse 78   Resp 17   Wt 45.3 kg   SpO2 100%   BMI 19.50 kg/m   Physical Exam Vitals signs and nursing note reviewed.  Constitutional:      General: She is not in acute distress.    Appearance: She is well-developed.  HENT:     Head: Normocephalic and atraumatic.     Nose: Nose normal.     Mouth/Throat:     Mouth: Mucous membranes are moist.  Eyes:      Extraocular Movements: Extraocular movements intact.     Conjunctiva/sclera: Conjunctivae normal.     Pupils: Pupils are equal, round, and reactive to light.  Neck:     Musculoskeletal: Normal range of motion and neck supple.  Cardiovascular:     Rate and Rhythm: Normal rate and regular rhythm.     Pulses: Normal pulses.     Heart sounds: Normal heart sounds. No murmur.  Pulmonary:     Effort: Pulmonary effort is normal. No respiratory distress.     Breath sounds: Normal breath sounds.  Abdominal:     Palpations: Abdomen is soft.     Tenderness: There is no abdominal tenderness.  Skin:    General: Skin is warm and dry.  Neurological:     General: No focal deficit present.     Mental Status: She is alert and oriented to person, place,  and time.     Cranial Nerves: No cranial nerve deficit.     Sensory: No sensory deficit.     Motor: No weakness.     Coordination: Coordination normal.     Gait: Gait normal.     Comments: 5+ out of 5 strength throughout, normal sensation, no drift, normal finger-to-nose finger  Psychiatric:        Mood and Affect: Mood normal.      ED Treatments / Results  Labs (all labs ordered are listed, but only abnormal results are displayed) Labs Reviewed - No data to display  EKG EKG Interpretation  Date/Time:  Saturday February 23 2018 09:03:32 EST Ventricular Rate:  75 PR Interval:    QRS Duration: 112 QT Interval:  441 QTC Calculation: 496 R Axis:   85 Text Interpretation:  Sinus rhythm Borderline intraventricular conduction delay Borderline prolonged QT interval Baseline wander in lead(s) II III aVF V6 Confirmed by Virgina Norfolk [phone]) on 02/23/2018 9:17:36 AM   Radiology No results found.  Procedures Procedures (including critical care time)  Medications Ordered in ED Medications - No data to display   Initial Impression / Assessment and Plan / ED Course  I have reviewed the triage vital signs and the nursing notes.  Pertinent  labs & imaging results that were available during my care of the patient were reviewed by me and considered in my medical decision making (see chart for details).     [name] is an 85 year old female with history of hypertension, stroke, high cholesterol, arrhythmia who presents the ED with palpitations.  Patient was recently admitted 2 days ago due to tachycardia.  She was evaluated by cardiology and they thought her arrhythmia represented an SVT versus AV nodal reentrant tachycardia.  Not indicative of A. fib.  Patient was offered an AV ablation but refused.  Patient had normal labs including TSH.  Patient has stable QTC on EKG.  EKG shows normal sinus rhythm.  No ischemic changes.  Patient denies any chest pain, shortness of breath.  She woke up feeling palpitations and then took her sotalol and symptoms have improved.  She is overall asymptomatic at this time.  Has normal neurological exam.  Patient had echocardiogram several days ago that was unremarkable.  Patient has cardiology follow-up next week.  Overall patient possibly had an episode of SVT/AV nodal reentrant tachycardia likely prior to arrival that has now resolved.  She did take her medicine after that event which likely helped.  Patient overall well-appearing.  Asymptomatic.  Encourage follow-up with cardiology as scheduled and given return precautions.  Discharged in ED in good condition.  This chart was dictated using voice recognition software.  Despite best efforts to proofread,  errors can occur which can change the documentation meaning.   Final Clinical Impressions(s) / ED Diagnoses   Final diagnoses:  Palpitations    ED Discharge Orders    None       Virgina Norfolk, DO 02/23/18 3832

## 2018-02-25 ENCOUNTER — Telehealth (HOSPITAL_COMMUNITY): Payer: Self-pay | Admitting: *Deleted

## 2018-02-25 NOTE — Telephone Encounter (Signed)
LMOM for pt re referral recvd. Pt to [facility]

## 2018-03-08 ENCOUNTER — Other Ambulatory Visit: Payer: Self-pay | Admitting: Cardiology

## 2018-03-08 ENCOUNTER — Telehealth: Payer: Self-pay | Admitting: Cardiology

## 2018-03-08 MED ORDER — AMLODIPINE BESYLATE 10 MG PO TABS: 10.0000 mg | ORAL_TABLET | Freq: Every day | ORAL | 2 refills | Status: DC

## 2018-03-08 NOTE — Telephone Encounter (Signed)
Needed Amlodipine 10 mg Rx incresed by MP. Rx sent

## 2018-03-15 ENCOUNTER — Ambulatory Visit (INDEPENDENT_AMBULATORY_CARE_PROVIDER_SITE_OTHER): Payer: Medicare Other | Admitting: Cardiology

## 2018-03-15 VITALS — BP 148/80 | HR 79 | Ht 64.0 in | Wt 99.9 lb

## 2018-03-15 DIAGNOSIS — I10 Essential (primary) hypertension: Secondary | ICD-10-CM | POA: Diagnosis not present

## 2018-03-15 MED ORDER — LISINOPRIL 10 MG PO TABS: 10.0000 mg | ORAL_TABLET | Freq: Every day | ORAL | 3 refills | Status: DC

## 2018-03-15 NOTE — Progress Notes (Signed)
BP improved, but remains suboptimal.  Added lisinopril 10 mg daily. BMP check in 10 days. Follow up with me as scheduled in March after completion of 4 week event monitor.  Elder Negus, MD [facility] [place] Pager: [phone] Office: [phone] If no answer Cell [phone]

## 2018-03-20 ENCOUNTER — Emergency Department (HOSPITAL_COMMUNITY): Payer: Medicare Other

## 2018-03-20 ENCOUNTER — Encounter (HOSPITAL_COMMUNITY): Payer: Self-pay | Admitting: Emergency Medicine

## 2018-03-20 ENCOUNTER — Emergency Department (HOSPITAL_COMMUNITY)
Admission: EM | Admit: 2018-03-20 | Discharge: 2018-03-20 | Disposition: A | Discharge status: Home / Self Care | Payer: Medicare Other | Attending: Emergency Medicine | Admitting: Emergency Medicine

## 2018-03-20 ENCOUNTER — Other Ambulatory Visit: Payer: Self-pay

## 2018-03-20 DIAGNOSIS — R002 Palpitations: Secondary | ICD-10-CM | POA: Diagnosis not present

## 2018-03-20 DIAGNOSIS — I10 Essential (primary) hypertension: Secondary | ICD-10-CM | POA: Diagnosis not present

## 2018-03-20 DIAGNOSIS — Z79899 Other long term (current) drug therapy: Secondary | ICD-10-CM | POA: Diagnosis not present

## 2018-03-20 DIAGNOSIS — R531 Weakness: Secondary | ICD-10-CM | POA: Diagnosis not present

## 2018-03-20 DIAGNOSIS — Z7982 Long term (current) use of aspirin: Secondary | ICD-10-CM | POA: Diagnosis not present

## 2018-03-20 LAB — URINALYSIS, ROUTINE W REFLEX MICROSCOPIC
Bilirubin Urine: NEGATIVE
Glucose, UA: NEGATIVE mg/dL
Hgb urine dipstick: NEGATIVE
Ketones, ur: 20 mg/dL — AB
Leukocytes,Ua: NEGATIVE
Nitrite: NEGATIVE
Protein, ur: NEGATIVE mg/dL
SPECIFIC GRAVITY, URINE: 1.011 (ref 1.005–1.030)
pH: 8 (ref 5.0–8.0)

## 2018-03-20 LAB — BASIC METABOLIC PANEL
Anion gap: 12 (ref 5–15)
BUN: 7 mg/dL — ABNORMAL LOW (ref 8–23)
CO2: 26 mmol/L (ref 22–32)
Calcium: 9.7 mg/dL (ref 8.9–10.3)
Chloride: 94 mmol/L — ABNORMAL LOW (ref 98–111)
Creatinine, Ser: 0.64 mg/dL (ref 0.44–1.00)
GFR calc Af Amer: >60 mL/min (ref >60)
GFR calc non Af Amer: >60 mL/min (ref >60)
GLUCOSE: 119 mg/dL — AB (ref 70–99)
Potassium: 3.8 mmol/L (ref 3.5–5.1)
Sodium: 132 mmol/L — ABNORMAL LOW (ref 135–145)

## 2018-03-20 LAB — CBC WITH DIFFERENTIAL/PLATELET
Abs Immature Granulocytes: 0.12 10*3/uL — ABNORMAL HIGH (ref 0.00–0.07)
Basophils Absolute: 0 10*3/uL (ref 0.0–0.1)
Basophils Relative: 0 %
Eosinophils Absolute: 0 10*3/uL (ref 0.0–0.5)
Eosinophils Relative: 0 %
HEMATOCRIT: 43.3 % (ref 36.0–46.0)
HEMOGLOBIN: 14.8 g/dL (ref 12.0–15.0)
Immature Granulocytes: 1 %
Lymphocytes Relative: 19 %
Lymphs Abs: 1.7 10*3/uL (ref 0.7–4.0)
MCH: 29.5 pg (ref 26.0–34.0)
MCHC: 34.2 g/dL (ref 30.0–36.0)
MCV: 86.3 fL (ref 80.0–100.0)
MONO ABS: 1.4 10*3/uL — AB (ref 0.1–1.0)
Monocytes Relative: 16 %
Neutro Abs: 5.5 10*3/uL (ref 1.7–7.7)
Neutrophils Relative %: 64 %
Platelets: 181 10*3/uL (ref 150–400)
RBC: 5.02 MIL/uL (ref 3.87–5.11)
RDW: 11.6 % (ref 11.5–15.5)
WBC: 8.7 10*3/uL (ref 4.0–10.5)
nRBC: 0 % (ref 0.0–0.2)

## 2018-03-20 LAB — I-STAT TROPONIN, ED: Troponin i, poc: 0 ng/mL (ref 0.00–0.08)

## 2018-03-20 NOTE — ED Notes (Signed)
Patient transported to X-ray 

## 2018-03-20 NOTE — ED Triage Notes (Addendum)
Pt arrives to ED from home with complaints of palpitations since 0300 today. EMS reports pt started feeling weak today and called [phone]. Pt possibly has hx of SVT or Afib and pt is currently wearing a heart monitor. Pt in NSR upon arrival. A&O x4.

## 2018-03-20 NOTE — ED Notes (Signed)
Patient verbalizes understanding of discharge instructions. Opportunity for questioning and answers were provided. Armband removed by staff, pt discharged from ED.  

## 2018-03-20 NOTE — Discharge Instructions (Addendum)
Call your cardiologist today to schedule a follow-up visit for this week.  Return here at once for persistent fast heartbeat, chest pain or pressure, or feeling like you are going to pass out

## 2018-03-20 NOTE — ED Provider Notes (Addendum)
[facility] Provider Note   CSN: [id number] Arrival date & time: 03/20/18  1225    History   Chief Complaint Chief Complaint  Patient presents with  . Weakness    HPI [name] is a 85 y.o. female.     85 year old female presents with palpitations that began early this morning.  Had some associated diaphoresis but denied any chest pain or chest pressure.  She was not short of breath.  Has had similar symptoms to this in the past and her doctor recently changed her medications.  She is unsure which medications were changed.  She denies any syncope or near syncope.  Feels weak all over her body without fever or chills.  Has had a mild cough and no urinary symptoms.  Called EMS and was transported here     Past Medical History:  Diagnosis Date  . Acute ischemic stroke (HCC) 12/28/2015   [name]/notes 12/28/2015  . Arthritis    "hands" (12/28/2015)  . GERD (gastroesophageal reflux disease)   . High cholesterol   . Hypertension   . PONV (postoperative nausea and vomiting)    "w/1st hip replacement"  . Symptomatic cholelithiasis     Patient Active Problem List   Diagnosis Date Noted  . Paroxysmal SVT (supraventricular tachycardia) (HCC) 02/21/2018  . Hyperlipidemia 08/28/2016  . AKI (acute kidney injury) (HCC)   . Frequent stools   . Sleep disturbance   . Hemiparesis of right nondominant side due to cerebral infarction   . Benign essential HTN   . 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 Ataxia, late effect of cerebrovascular disease   . Alteration of sensation as late effect of stroke   . CVA (cerebral vascular accident) (HCC) 12/29/2015  . Left sided numbness 12/28/2015  . Nausea & vomiting 12/28/2015  . Diarrhea 12/28/2015  . HTN (hypertension) 12/28/2015  . Acute encephalopathy 12/23/2015  . UTI (urinary tract infection) 12/23/2015  . Hyponatremia 12/23/2015  . Dehydration with hyponatremia 12/23/2015    Past Surgical  History:  Procedure Laterality Date  . CATARACT EXTRACTION W/ INTRAOCULAR LENS IMPLANT Right ~ 2011  . CHOLECYSTECTOMY N/A 08/17/2017   Procedure: LAPAROSCOPIC CHOLECYSTECTOMY WITH INTRAOPERATIVE CHOLANGIOGRAM ERAS PATHWAY;  Surgeon: Jimmye NormanWyatt, James, MD;  [facility];  Service: General;  Laterality: N/A;  . HIP CLOSED REDUCTION Right 09/29/2016   Procedure: CLOSED REDUCTION RIGHT HIP;  Surgeon: Samson FredericSwinteck, Brian, MD;  Location: [facility] OR;  Service: Orthopedics;  Laterality: Right;  . JOINT REPLACEMENT    . TOTAL HIP ARTHROPLASTY  2007-2008   left-right     OB History   No obstetric history on file.      Home Medications    Prior to Admission medications   Medication Sig Start Date End Date Taking? Authorizing Provider  acetaminophen (TYLENOL) 325 MG tablet Take 2 tablets (650 mg total) by mouth every 6 (six) hours as needed for mild pain (or Fever >/= 101). 02/21/18   Alessandra BevelsKamineni, Neelima, MD  amLODipine (NORVASC) 10 MG tablet Take 1 tablet (10 mg total) by mouth daily. 03/08/18   Yates DecampGanji, Jay, MD  aspirin EC 81 MG tablet Take 1 tablet (81 mg total) by mouth daily. 02/21/18 02/21/19  Alessandra BevelsKamineni, Neelima, MD  Calcium Carb-Cholecalciferol (CALCIUM 600+D3 PO) Take 1 tablet by mouth 2 (two) times daily.    [provider]  latanoprost (XALATAN) 0.005 % ophthalmic solution Place 1 drop into both eyes at bedtime.    [provider]  lisinopril (PRINIVIL,ZESTRIL) 10  MG tablet Take 1 tablet (10 mg total) by mouth daily. 03/15/18 06/13/18  Patwardhan, Anabel Bene, MD  Multiple Vitamin (MULTI-VITAMINS) TABS Take 1 tablet by mouth daily.     [provider]  Omega-3 Fatty Acids (FISH OIL PO) Take 1 tablet by mouth daily.    [provider]  ondansetron (ZOFRAN ODT) 4 MG disintegrating tablet Take 1 tablet (4 mg total) by mouth every 8 (eight) hours as needed for nausea or vomiting. Patient not taking: Reported on 03/15/2018 08/16/17   Cecille Po, MD  oxyCODONE-acetaminophen  (PERCOCET/ROXICET) 5-325 MG tablet Take 0.5 tablets by mouth every 6 (six) hours as needed for severe pain. Patient not taking: Reported on 03/15/2018 08/16/17   Cecille Po, MD  pantoprazole (PROTONIX) 40 MG tablet Take 1 tablet (40 mg total) by mouth daily. 01/06/16   Angiulli, Mcarthur Rossetti, PA-C  rosuvastatin (CRESTOR) 5 MG tablet Take 1 tablet (5 mg total) by mouth daily at 6 PM. 01/06/16   Angiulli, Mcarthur Rossetti, PA-C  sotalol (BETAPACE) 80 MG tablet Take 1 tablet (80 mg total) by mouth daily. 02/22/18   Alessandra Bevels, MD    Family History Family History  Problem Relation Age of Onset  . CAD Father   . Stroke Father   . Alcohol abuse Brother   . CAD Brother   . Aneurysm Brother     Social History Social History   Tobacco Use  . Smoking status: Never Smoker  . Smokeless tobacco: Never Used  Substance Use Topics  . Alcohol use: No  . Drug use: No     Allergies   Ativan [lorazepam]; Glucosamine sulfate; Niacin; Phenergan [promethazine]; and Amoxicillin   Review of Systems Review of Systems  All other systems reviewed and are negative.    Physical Exam Updated Vital Signs BP (!) 144/60 (BP Location: Right Arm)   Pulse 82   Temp 98.4 F (36.9 C) (Oral)   Resp 20   SpO2 98%   Physical Exam Vitals signs and nursing note reviewed.  Constitutional:      General: She is not in acute distress.    Appearance: Normal appearance. She is well-developed. She is not toxic-appearing.  HENT:     Head: Normocephalic and atraumatic.  Eyes:     General: Lids are normal.     Conjunctiva/sclera: Conjunctivae normal.     Pupils: Pupils are equal, round, and reactive to light.  Neck:     Musculoskeletal: Normal range of motion and neck supple.     Thyroid: No thyroid mass.     Trachea: No tracheal deviation.  Cardiovascular:     Rate and Rhythm: Normal rate and regular rhythm.     Heart sounds: Normal heart sounds. No murmur. No gallop.   Pulmonary:     Effort: Pulmonary  effort is normal. No respiratory distress.     Breath sounds: Normal breath sounds. No stridor. No decreased breath sounds, wheezing, rhonchi or rales.  Abdominal:     General: Bowel sounds are normal. There is no distension.     Palpations: Abdomen is soft.     Tenderness: There is no abdominal tenderness. There is no rebound.  Musculoskeletal: Normal range of motion.        General: No tenderness.  Skin:    General: Skin is warm and dry.     Findings: No abrasion or rash.  Neurological:     Mental Status: She is alert and oriented to person, place, and time.  GCS: GCS eye subscore is 4. GCS verbal subscore is 5. GCS motor subscore is 6.     Cranial Nerves: No cranial nerve deficit.     Sensory: No sensory deficit.  Psychiatric:        Speech: Speech normal.        Behavior: Behavior normal.      ED Treatments / Results  Labs (all labs ordered are listed, but only abnormal results are displayed) Labs Reviewed  URINE CULTURE  CBC WITH DIFFERENTIAL/PLATELET  BASIC METABOLIC PANEL  URINALYSIS, ROUTINE W REFLEX MICROSCOPIC  I-STAT TROPONIN, ED    EKG EKG Interpretation  Date/Time:  Wednesday March 20 2018 12:39:54 EST Ventricular Rate:  78 PR Interval:    QRS Duration: 99 QT Interval:  407 QTC Calculation: 464 R Axis:   88 Text Interpretation:  Sinus or ectopic atrial rhythm Borderline right axis deviation No significant change since last tracing Confirmed by Lorre Nick ([id number]) on 03/20/2018 12:49:17 PM   Radiology No results found.  Procedures Procedures (including critical care time)  Medications Ordered in ED Medications - No data to display   Initial Impression / Assessment and Plan / ED Course  I have reviewed the triage vital signs and the nursing notes.  Pertinent labs & imaging results that were available during my care of the patient were reviewed by me and considered in my medical decision making (see chart for details).         Patients EKG without acute findings.  Patient's laboratory studies including troponin and urinalysis are within normal limits.  Patient was instructed to follow-up with her cardiologist as well as given return precautions  Final Clinical Impressions(s) / ED Diagnoses   Final diagnoses:  None    ED Discharge Orders    None       Lorre Nick, MD 03/20/18 1425    Lorre Nick, MD 03/20/18 1427

## 2018-03-21 LAB — URINE CULTURE

## 2018-03-26 LAB — BASIC METABOLIC PANEL
BUN/Creatinine Ratio: 12 (ref 12–28)
BUN: 9 mg/dL (ref 8–27)
CO2: 24 mmol/L (ref 20–29)
Calcium: 9.8 mg/dL (ref 8.7–10.3)
Chloride: 93 mmol/L — ABNORMAL LOW (ref 96–106)
Creatinine, Ser: 0.73 mg/dL (ref 0.57–1.00)
GFR calc Af Amer: 89 mL/min/{1.73_m2} (ref >59)
GFR calc non Af Amer: 77 mL/min/{1.73_m2} (ref >59)
Glucose: 107 mg/dL — ABNORMAL HIGH (ref 65–99)
Potassium: 4.9 mmol/L (ref 3.5–5.2)
Sodium: 134 mmol/L (ref 134–144)

## 2018-03-30 DIAGNOSIS — R002 Palpitations: Secondary | ICD-10-CM | POA: Diagnosis not present

## 2018-04-08 NOTE — Progress Notes (Signed)
Patient is here for follow up visit.  Subjective:   '@Patient'$  ID: [name], female    DOB: [birth year], 85 y.o.   MRN: [id number]  Chief Complaint  Patient presents with  . Hypertension  . Follow-up    6wk  . Results    monitor    HPI  85 y/o Caucasian female with hypertension, h/o CVA in 2017 with minimal left sided sensory deficit, hyperlipidemia, h/o hip replacement, h/o cholecystectomy 07/2017, was seen by me during her hospitalization in 01/2018 with tachycardia.   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 I offered the patient evaluation by EP and consideration for ablation. However, patient preferred medical management. I started the patient on sotalol 80 mg twice daily Now admitted with palpitations. Patient was subsequently in the emergency room complains of palpitations that had resolved. EKG showed normal sinus rhythm. She wore event monitor with no recurrence of SVT or any other arrhtymia seen.  Patient is here for 4 week follow up with her daughter. She continues to have symptoms of palpitations only at night. They never occur during the daytime. She is worried about her blood pressure given h/o of prior stroke.   Past Medical History:  Diagnosis Date  . Acute ischemic stroke ([place]) 12/28/2015   Archie Endo 12/28/2015  . Arthritis    "hands" (12/28/2015)  . GERD (gastroesophageal reflux disease)   . High cholesterol   . Hypertension   . PONV (postoperative nausea and vomiting)    "w/1st hip replacement"  . Symptomatic cholelithiasis      Past Surgical History:  Procedure Laterality Date  . CATARACT EXTRACTION W/ INTRAOCULAR LENS IMPLANT Right ~ 2011  . CHOLECYSTECTOMY N/A 08/17/2017   Procedure: LAPAROSCOPIC CHOLECYSTECTOMY WITH INTRAOPERATIVE CHOLANGIOGRAM ERAS PATHWAY;  Surgeon: Judeth Horn, MD;  Location: [facility];  Service: General;  Laterality: N/A;  . HIP CLOSED REDUCTION Right 09/29/2016   Procedure: CLOSED  REDUCTION RIGHT HIP;  Surgeon: Rod Can, MD;  Location: [facility];  Service: Orthopedics;  Laterality: Right;  . JOINT REPLACEMENT    . TOTAL HIP ARTHROPLASTY  2007-2008   left-right     Social History   Socioeconomic History  . Marital status: Divorced    Spouse name: Not on file  . Number of children: 1  . Years of education: Not on file  . Highest education level: Not on file  Occupational History  . Not on file  Social Needs  . Financial resource strain: Not on file  . Food insecurity:    Worry: Not on file    Inability: Not on file  . Transportation needs:    Medical: Not on file    Non-medical: Not on file  Tobacco Use  . Smoking status: Never Smoker  . Smokeless tobacco: Never Used  Substance and Sexual Activity  . Alcohol use: No  . Drug use: No  . Sexual activity: Never  Lifestyle  . Physical activity:    Days per week: Not on file    Minutes per session: Not on file  . Stress: Not on file  Relationships  . Social connections:    Talks on phone: Not on file    Gets together: Not on file    Attends religious service: Not on file    Active member of club or organization: Not on file    Attends meetings of clubs or organizations: Not on file    Relationship status: Not on file  .  Intimate partner violence:    Fear of current or ex partner: Not on file    Emotionally abused: Not on file    Physically abused: Not on file    Forced sexual activity: Not on file  Other Topics Concern  . Not on file  Social History Narrative  . Not on file     Current Outpatient Medications on File Prior to Visit  Medication Sig Dispense Refill  . aspirin EC 81 MG tablet Take 1 tablet (81 mg total) by mouth daily. 150 tablet 2  . Calcium Carb-Cholecalciferol (CALCIUM 600+D3 PO) Take 1 tablet by mouth 2 (two) times daily.    Marland Kitchen latanoprost (XALATAN) 0.005 % ophthalmic solution Place 1 drop into both eyes at bedtime.    . Multiple Vitamin (MULTI-VITAMINS) TABS Take 1  tablet by mouth daily.     . Omega-3 Fatty Acids (FISH OIL PO) Take 1 tablet by mouth daily.    . pantoprazole (PROTONIX) 40 MG tablet Take 1 tablet (40 mg total) by mouth daily. 30 tablet 1  . rosuvastatin (CRESTOR) 5 MG tablet Take 1 tablet (5 mg total) by mouth daily at 6 PM. 30 tablet 1   No current facility-administered medications on file prior to visit.     Cardiovascular studies:  EKG 03/01/2018: Sinus rhythm 64 bpm. Normal axis. Normal conduction. QTC interval was 73 msec  Event monitor 03/01/2018- 03/27/2018: No arrhtymias seen  Hospital echocardiogram 02/20/2018: Left ventricle: The cavity size was normal. Systolic function was vigorous. The estimated ejection fraction was in the range of 65% to 70%. Wall motion was normal; there were no regional wall motion abnormalities. Doppler parameters are consistent with abnormal left ventricular relaxation (grade 1 diastolic dysfunction). Aortic valve: There was mild regurgitation. Pericardium, extracardiac: A small to moderate circumferential pericardial effusion without tamponade physiology. No significant change compared to previous study in 2017.  Carotid Doppler 2017:  - The vertebral arteries appear patent with antegrade flow. - Findings consistent with 1- 39 percent stenosis involving the   right internal carotid artery and the left internal carotid   artery. - Incidental finding: left thyroid cyst. Recent labs:  CBC Latest Ref Rng & Units 03/20/2018  WBC 4.0 - 10.5 K/uL 8.7  Hemoglobin 12.0 - 15.0 g/dL 14.8  Hematocrit 36.0 - 46.0 % 43.3  Platelets 150 - 400 K/uL 181   02/21/2018: EGFR >60. TSH 2.8 normal.    CMP Latest Ref Rng & Units 03/25/2018  Glucose 65 - 99 mg/dL 107(H)  BUN 8 - 27 mg/dL 9  Creatinine 0.57 - 1.00 mg/dL 0.73  Sodium 134 - 144 mmol/L 134  Potassium 3.5 - 5.2 mmol/L 4.9  Chloride 96 - 106 mmol/L 93(L)  CO2 20 - 29 mmol/L 24  Calcium 8.7 - 10.3 mg/dL 9.8  Total Protein 6.5 - 8.1 g/dL -  Total  Bilirubin 0.3 - 1.2 mg/dL -  Alkaline Phos 38 - 126 U/L -  AST 15 - 41 U/L -  ALT 0 - 44 U/L -   Lipid Panel 12/29/2015: Component Value   CHOL 175   TRIG 171 (H)   HDL 36 (L)   CHOLHDL 4.9   VLDL 34   LDLCALC 105 (H)   HbA1c 5.1% in 2017  EKG 04/10/2018: Sinus rhythm 63 bpm Normal axis. Normal conduction. Normal QTc 451 msec   Review of Systems  Constitution: Negative for decreased appetite, malaise/fatigue, weight gain and weight loss.  HENT: Negative for congestion.   Eyes: Negative for visual  disturbance.  Cardiovascular: Positive for palpitations. Negative for chest pain, claudication, dyspnea on exertion, leg swelling and syncope.  Respiratory: Negative for shortness of breath.   Endocrine: Negative for cold intolerance.  Hematologic/Lymphatic: Does not bruise/bleed easily.  Skin: Negative for itching and rash.  Musculoskeletal: Negative for myalgias.  Gastrointestinal: Negative for abdominal pain, nausea and vomiting.  Genitourinary: Negative for dysuria.  Neurological: Negative for dizziness and weakness.  Psychiatric/Behavioral: The patient is not nervous/anxious.   All other systems reviewed and are negative.      Objective:    Vitals:   04/10/18 1039  BP: (!) 145/54  Pulse: 69  SpO2: 98%     Physical Exam  Constitutional: She appears well-developed and well-nourished. No distress.  HENT:  Head: Atraumatic.  Eyes: Conjunctivae are normal.  Neck: Neck supple. No JVD present. No thyromegaly present.  Cardiovascular: Normal rate, regular rhythm, normal heart sounds and intact distal pulses. Exam reveals no gallop.  No murmur heard. Pulmonary/Chest: Effort normal and breath sounds normal.  Abdominal: Soft. Bowel sounds are normal.  Musculoskeletal: Normal range of motion.        General: No edema.  Neurological: She is alert.  Skin: Skin is warm and dry.  Psychiatric: She has a normal mood and affect.        Assessment & Recommendations:    85 y/o Caucasian female with hypertension, h/o CVA in 2017 with minimal left sided sensory deficit, hyperlipidemia, h/o hip replacement, h/o cholecystectomy 07/2017, now with paroxysmal SVT, likely AVNRT  PSVT: No recurrence of SVT of Sotalol 80 mg daily. QTc is acceptable.  She continues to have palpitations, which are not correlated with any arrhthymias. I suspect some of this is related to anxiety.   Hypertension: Currently on amlodipine 10 mg daily, lisinopril 10 mg daily. Increased lisinopril to 20 mg daily.   H/o CVA: No recent events.  Repeat BMP check in 2 weeks. Follow up with me in 3 months.  Nigel Mormon, MD [facility] Pager: [phone] Office: [phone] If no answer Cell [phone]

## 2018-04-10 ENCOUNTER — Ambulatory Visit (INDEPENDENT_AMBULATORY_CARE_PROVIDER_SITE_OTHER): Payer: Medicare Other | Admitting: Cardiology

## 2018-04-10 ENCOUNTER — Other Ambulatory Visit: Payer: Self-pay

## 2018-04-10 ENCOUNTER — Encounter: Payer: Self-pay | Admitting: Cardiology

## 2018-04-10 VITALS — BP 145/54 | HR 69 | Ht 60.0 in | Wt 98.3 lb

## 2018-04-10 DIAGNOSIS — I10 Essential (primary) hypertension: Secondary | ICD-10-CM | POA: Diagnosis not present

## 2018-04-10 DIAGNOSIS — I471 Supraventricular tachycardia: Secondary | ICD-10-CM

## 2018-04-10 MED ORDER — SOTALOL HCL 80 MG PO TABS: 80.0000 mg | ORAL_TABLET | Freq: Every day | ORAL | 3 refills | Status: DC

## 2018-04-10 MED ORDER — AMLODIPINE BESYLATE 10 MG PO TABS: 10.0000 mg | ORAL_TABLET | Freq: Every day | ORAL | 3 refills | Status: DC

## 2018-04-10 MED ORDER — LISINOPRIL 20 MG PO TABS: 20.0000 mg | ORAL_TABLET | Freq: Every day | ORAL | 3 refills | Status: DC

## 2018-07-11 ENCOUNTER — Encounter: Payer: Self-pay | Admitting: Cardiology

## 2018-07-11 ENCOUNTER — Ambulatory Visit (INDEPENDENT_AMBULATORY_CARE_PROVIDER_SITE_OTHER): Payer: Medicare Other | Admitting: Cardiology

## 2018-07-11 ENCOUNTER — Other Ambulatory Visit: Payer: Self-pay

## 2018-07-11 DIAGNOSIS — I10 Essential (primary) hypertension: Secondary | ICD-10-CM

## 2018-07-11 DIAGNOSIS — I471 Supraventricular tachycardia: Secondary | ICD-10-CM | POA: Diagnosis not present

## 2018-07-11 NOTE — Progress Notes (Signed)
    Patient is here for follow up visit.  Subjective:   @Patient  ID: [name], female    DOB: 1933/10/06, 85 y.o.   MRN: [id number]   I connected with the patient on 07/11/2018 by a telephone call and verified that I am speaking with the correct person using two identifiers.     This visit type was conducted due to national recommendations for restrictions regarding the COVID-19 Pandemic (e.g. social distancing).  This format is felt to be most appropriate for this patient at this time.  All issues noted in this document were discussed and addressed.  No physical exam was performed (except for noted visual exam findings with Tele health visits).  The patient has consented to conduct a Tele health visit and understands insurance will be billed.    Chief Complaint  Patient presents with  . Palpitations    HPI  84 y/o Caucasian female with hypertension, h/o CVA in 2017 with minimal left sided sensory deficit, hyperlipidemia, h/o hip replacement, h/o cholecystectomy 07/2017, paroxysmal SVT, likely AVNRT  Patient has been doing well on sotalol 80 mg qd. BP well controlled as well. She has only occasional palpitations symptoms.   I have not made any change to her medical therapy today. I will see her in 3 months and obtain an EKG to monitor QTc.   Time spent: 10 min  [place], MD [facility] Pager: [phone] Office: [phone] If no answer Cell [phone]

## 2018-08-24 ENCOUNTER — Inpatient Hospital Stay (HOSPITAL_COMMUNITY): Payer: Medicare Other

## 2018-08-24 ENCOUNTER — Inpatient Hospital Stay (HOSPITAL_COMMUNITY)
Admission: EM | Admit: 2018-08-24 | Discharge: 2018-08-28 | DRG: 643 | Disposition: A | Discharge status: Home / Self Care | Payer: Medicare Other | Attending: Internal Medicine | Admitting: Internal Medicine

## 2018-08-24 ENCOUNTER — Emergency Department (HOSPITAL_COMMUNITY): Payer: Medicare Other

## 2018-08-24 ENCOUNTER — Encounter (HOSPITAL_COMMUNITY): Payer: Self-pay | Admitting: Emergency Medicine

## 2018-08-24 DIAGNOSIS — Z9049 Acquired absence of other specified parts of digestive tract: Secondary | ICD-10-CM | POA: Diagnosis not present

## 2018-08-24 DIAGNOSIS — E7800 Pure hypercholesterolemia, unspecified: Secondary | ICD-10-CM | POA: Diagnosis present

## 2018-08-24 DIAGNOSIS — G9340 Encephalopathy, unspecified: Secondary | ICD-10-CM | POA: Diagnosis not present

## 2018-08-24 DIAGNOSIS — F061 Catatonic disorder due to known physiological condition: Secondary | ICD-10-CM | POA: Diagnosis present

## 2018-08-24 DIAGNOSIS — M47812 Spondylosis without myelopathy or radiculopathy, cervical region: Secondary | ICD-10-CM | POA: Diagnosis present

## 2018-08-24 DIAGNOSIS — E222 Syndrome of inappropriate secretion of antidiuretic hormone: Principal | ICD-10-CM | POA: Diagnosis present

## 2018-08-24 DIAGNOSIS — E860 Dehydration: Secondary | ICD-10-CM | POA: Diagnosis present

## 2018-08-24 DIAGNOSIS — M19041 Primary osteoarthritis, right hand: Secondary | ICD-10-CM | POA: Diagnosis present

## 2018-08-24 DIAGNOSIS — Z880 Allergy status to penicillin: Secondary | ICD-10-CM

## 2018-08-24 DIAGNOSIS — G8929 Other chronic pain: Secondary | ICD-10-CM | POA: Diagnosis present

## 2018-08-24 DIAGNOSIS — Z8673 Personal history of transient ischemic attack (TIA), and cerebral infarction without residual deficits: Secondary | ICD-10-CM | POA: Diagnosis not present

## 2018-08-24 DIAGNOSIS — Z8249 Family history of ischemic heart disease and other diseases of the circulatory system: Secondary | ICD-10-CM | POA: Diagnosis not present

## 2018-08-24 DIAGNOSIS — Z823 Family history of stroke: Secondary | ICD-10-CM | POA: Diagnosis not present

## 2018-08-24 DIAGNOSIS — R4182 Altered mental status, unspecified: Secondary | ICD-10-CM | POA: Diagnosis not present

## 2018-08-24 DIAGNOSIS — Z961 Presence of intraocular lens: Secondary | ICD-10-CM | POA: Diagnosis present

## 2018-08-24 DIAGNOSIS — E785 Hyperlipidemia, unspecified: Secondary | ICD-10-CM | POA: Diagnosis present

## 2018-08-24 DIAGNOSIS — E861 Hypovolemia: Secondary | ICD-10-CM | POA: Diagnosis present

## 2018-08-24 DIAGNOSIS — I119 Hypertensive heart disease without heart failure: Secondary | ICD-10-CM | POA: Diagnosis present

## 2018-08-24 DIAGNOSIS — Z811 Family history of alcohol abuse and dependence: Secondary | ICD-10-CM

## 2018-08-24 DIAGNOSIS — Z20828 Contact with and (suspected) exposure to other viral communicable diseases: Secondary | ICD-10-CM | POA: Diagnosis present

## 2018-08-24 DIAGNOSIS — F4320 Adjustment disorder, unspecified: Secondary | ICD-10-CM | POA: Diagnosis not present

## 2018-08-24 DIAGNOSIS — Z96643 Presence of artificial hip joint, bilateral: Secondary | ICD-10-CM | POA: Diagnosis present

## 2018-08-24 DIAGNOSIS — F4323 Adjustment disorder with mixed anxiety and depressed mood: Secondary | ICD-10-CM | POA: Diagnosis present

## 2018-08-24 DIAGNOSIS — M19042 Primary osteoarthritis, left hand: Secondary | ICD-10-CM | POA: Diagnosis present

## 2018-08-24 DIAGNOSIS — Z9841 Cataract extraction status, right eye: Secondary | ICD-10-CM | POA: Diagnosis not present

## 2018-08-24 DIAGNOSIS — E871 Hypo-osmolality and hyponatremia: Secondary | ICD-10-CM | POA: Diagnosis present

## 2018-08-24 DIAGNOSIS — Z888 Allergy status to other drugs, medicaments and biological substances status: Secondary | ICD-10-CM

## 2018-08-24 DIAGNOSIS — Z881 Allergy status to other antibiotic agents status: Secondary | ICD-10-CM | POA: Diagnosis not present

## 2018-08-24 DIAGNOSIS — Z885 Allergy status to narcotic agent status: Secondary | ICD-10-CM | POA: Diagnosis not present

## 2018-08-24 DIAGNOSIS — I10 Essential (primary) hypertension: Secondary | ICD-10-CM | POA: Diagnosis present

## 2018-08-24 DIAGNOSIS — G9341 Metabolic encephalopathy: Secondary | ICD-10-CM | POA: Diagnosis present

## 2018-08-24 DIAGNOSIS — Z66 Do not resuscitate: Secondary | ICD-10-CM | POA: Diagnosis present

## 2018-08-24 DIAGNOSIS — K219 Gastro-esophageal reflux disease without esophagitis: Secondary | ICD-10-CM | POA: Diagnosis present

## 2018-08-24 LAB — BASIC METABOLIC PANEL
Anion gap: 14 (ref 5–15)
Anion gap: 13 (ref 5–15)
BUN: 11 mg/dL (ref 8–23)
BUN: 9 mg/dL (ref 8–23)
CO2: 19 mmol/L — ABNORMAL LOW (ref 22–32)
CO2: 21 mmol/L — ABNORMAL LOW (ref 22–32)
Calcium: 8.8 mg/dL — ABNORMAL LOW (ref 8.9–10.3)
Calcium: 8.9 mg/dL (ref 8.9–10.3)
Chloride: 93 mmol/L — ABNORMAL LOW (ref 98–111)
Chloride: 93 mmol/L — ABNORMAL LOW (ref 98–111)
Creatinine, Ser: 0.51 mg/dL (ref 0.44–1.00)
Creatinine, Ser: 0.57 mg/dL (ref 0.44–1.00)
GFR calc Af Amer: >60 mL/min (ref >60)
GFR calc Af Amer: >60 mL/min (ref >60)
GFR calc non Af Amer: >60 mL/min (ref >60)
GFR calc non Af Amer: >60 mL/min (ref >60)
Glucose, Bld: 105 mg/dL — ABNORMAL HIGH (ref 70–99)
Glucose, Bld: 108 mg/dL — ABNORMAL HIGH (ref 70–99)
Potassium: 4 mmol/L (ref 3.5–5.1)
Potassium: 3.5 mmol/L (ref 3.5–5.1)
Sodium: 126 mmol/L — ABNORMAL LOW (ref 135–145)
Sodium: 127 mmol/L — ABNORMAL LOW (ref 135–145)

## 2018-08-24 LAB — COMPREHENSIVE METABOLIC PANEL
ALT: 14 U/L (ref 0–44)
AST: 22 U/L (ref 15–41)
Albumin: 4.1 g/dL (ref 3.5–5.0)
Alkaline Phosphatase: 58 U/L (ref 38–126)
Anion gap: 12 (ref 5–15)
BUN: 13 mg/dL (ref 8–23)
CO2: 23 mmol/L (ref 22–32)
Calcium: 9.2 mg/dL (ref 8.9–10.3)
Chloride: 86 mmol/L — ABNORMAL LOW (ref 98–111)
Creatinine, Ser: 0.65 mg/dL (ref 0.44–1.00)
GFR calc Af Amer: >60 mL/min (ref >60)
GFR calc non Af Amer: >60 mL/min (ref >60)
Glucose, Bld: 117 mg/dL — ABNORMAL HIGH (ref 70–99)
Potassium: 3.8 mmol/L (ref 3.5–5.1)
Sodium: 121 mmol/L — ABNORMAL LOW (ref 135–145)
Total Bilirubin: 1.7 mg/dL — ABNORMAL HIGH (ref 0.3–1.2)
Total Protein: 6.3 g/dL — ABNORMAL LOW (ref 6.5–8.1)

## 2018-08-24 LAB — POCT I-STAT EG7
Acid-Base Excess: 2 mmol/L (ref 0.0–2.0)
Bicarbonate: 25.7 mmol/L (ref 20.0–28.0)
Calcium, Ion: 1.1 mmol/L — ABNORMAL LOW (ref 1.15–1.40)
HCT: 42 % (ref 36.0–46.0)
Hemoglobin: 14.3 g/dL (ref 12.0–15.0)
O2 Saturation: 94 %
Potassium: 3.8 mmol/L (ref 3.5–5.1)
Sodium: 121 mmol/L — ABNORMAL LOW (ref 135–145)
TCO2: 27 mmol/L (ref 22–32)
pCO2, Ven: 35.7 mmHg — ABNORMAL LOW (ref 44.0–60.0)
pH, Ven: 7.464 — ABNORMAL HIGH (ref 7.250–7.430)
pO2, Ven: 66 mmHg — ABNORMAL HIGH (ref 32.0–45.0)

## 2018-08-24 LAB — SARS CORONAVIRUS 2 BY RT PCR (HOSPITAL ORDER, PERFORMED IN ~~LOC~~ HOSPITAL LAB): SARS Coronavirus 2: NEGATIVE

## 2018-08-24 LAB — DIFFERENTIAL
Abs Immature Granulocytes: 0.08 10*3/uL — ABNORMAL HIGH (ref 0.00–0.07)
Basophils Absolute: 0 10*3/uL (ref 0.0–0.1)
Basophils Relative: 0 %
Eosinophils Absolute: 0 10*3/uL (ref 0.0–0.5)
Eosinophils Relative: 0 %
Immature Granulocytes: 1 %
Lymphocytes Relative: 30 %
Lymphs Abs: 1.8 10*3/uL (ref 0.7–4.0)
Monocytes Absolute: 0.9 10*3/uL (ref 0.1–1.0)
Monocytes Relative: 15 %
Neutro Abs: 3.2 10*3/uL (ref 1.7–7.7)
Neutrophils Relative %: 54 %

## 2018-08-24 LAB — CBC
HCT: 39.7 % (ref 36.0–46.0)
Hemoglobin: 14.8 g/dL (ref 12.0–15.0)
MCH: 30.6 pg (ref 26.0–34.0)
MCHC: 37.3 g/dL — ABNORMAL HIGH (ref 30.0–36.0)
MCV: 82.2 fL (ref 80.0–100.0)
Platelets: 213 10*3/uL (ref 150–400)
RBC: 4.83 MIL/uL (ref 3.87–5.11)
RDW: 11.4 % — ABNORMAL LOW (ref 11.5–15.5)
WBC: 5.9 10*3/uL (ref 4.0–10.5)
nRBC: 0 % (ref 0.0–0.2)

## 2018-08-24 LAB — PROTIME-INR
INR: 1 (ref 0.8–1.2)
Prothrombin Time: 12.9 seconds (ref 11.4–15.2)

## 2018-08-24 LAB — URINALYSIS, ROUTINE W REFLEX MICROSCOPIC
Bacteria, UA: NONE SEEN
Bilirubin Urine: NEGATIVE
Glucose, UA: NEGATIVE mg/dL
Ketones, ur: 5 mg/dL — AB
Leukocytes,Ua: NEGATIVE
Nitrite: NEGATIVE
Protein, ur: NEGATIVE mg/dL
Specific Gravity, Urine: 1.004 — ABNORMAL LOW (ref 1.005–1.030)
pH: 7 (ref 5.0–8.0)

## 2018-08-24 LAB — SODIUM, URINE, RANDOM: Sodium, Ur: 37 mmol/L

## 2018-08-24 LAB — RAPID URINE DRUG SCREEN, HOSP PERFORMED
Amphetamines: NOT DETECTED
Barbiturates: NOT DETECTED
Benzodiazepines: POSITIVE — AB
Cocaine: NOT DETECTED
Opiates: NOT DETECTED
Tetrahydrocannabinol: NOT DETECTED

## 2018-08-24 LAB — TSH: TSH: 3.096 u[IU]/mL (ref 0.350–4.500)

## 2018-08-24 LAB — APTT: aPTT: 29 seconds (ref 24–36)

## 2018-08-24 LAB — CK: Total CK: 45 U/L (ref 38–234)

## 2018-08-24 LAB — ETHANOL: Alcohol, Ethyl (B): <10 mg/dL (ref <10)

## 2018-08-24 LAB — OSMOLALITY: Osmolality: 259 mOsm/kg — ABNORMAL LOW (ref 275–295)

## 2018-08-24 LAB — OSMOLALITY, URINE: Osmolality, Ur: 184 mOsm/kg — ABNORMAL LOW (ref 300–900)

## 2018-08-24 MED ORDER — SODIUM CHLORIDE 0.9 % IV BOLUS
1000.0000 mL | Freq: Once | INTRAVENOUS | Status: AC
  Administered 2018-08-24: 1000 mL via INTRAVENOUS

## 2018-08-24 MED ORDER — ACETAMINOPHEN 650 MG RE SUPP: 650.0000 mg | Freq: Four times a day (QID) | RECTAL | Status: DC | PRN

## 2018-08-24 MED ORDER — MIDAZOLAM HCL 2 MG/2ML IJ SOLN
2.0000 mg | Freq: Once | INTRAMUSCULAR | Status: AC
  Administered 2018-08-24: 14:00:00 2 mg via INTRAVENOUS
  Filled 2018-08-24: qty 2

## 2018-08-24 MED ORDER — ENOXAPARIN SODIUM 40 MG/0.4ML ~~LOC~~ SOLN
40.0000 mg | SUBCUTANEOUS | Status: DC
  Administered 2018-08-24 – 2018-08-27 (×4): 40 mg via SUBCUTANEOUS
  Filled 2018-08-24 (×4): qty 0.4

## 2018-08-24 MED ORDER — ACETAMINOPHEN 325 MG PO TABS
650.0000 mg | ORAL_TABLET | Freq: Four times a day (QID) | ORAL | Status: DC | PRN
  Administered 2018-08-25: 650 mg via ORAL
  Filled 2018-08-24: qty 2

## 2018-08-24 MED ORDER — SODIUM CHLORIDE 0.9% FLUSH
3.0000 mL | Freq: Two times a day (BID) | INTRAVENOUS | Status: DC
  Administered 2018-08-24 – 2018-08-27 (×5): 3 mL via INTRAVENOUS

## 2018-08-24 NOTE — ED Notes (Signed)
Report given to Alana, RN.

## 2018-08-24 NOTE — ED Provider Notes (Addendum)
[facility] Provider Note   CSN: [id number] Arrival date & time: 08/24/18  1314     History   Chief Complaint Chief Complaint  Patient presents with  . Altered Mental Status    HPI Ardith DarkJenie S Lieb is a 85 y.o. female.   Level 5 caveat secondary to altered mental status  HPI  85 year old female history of hypertension, CVA with some residual left-sided sensory deficit, hyperlipidemia, paroxysmal SVT brought in via EMS with last known normal 8:30 AM and now altered mental status..  The state family went over to check on her at [address] and found her unresponsive.  EMS reports blood sugar 138 in route.  They report no previous similar symptoms.  Review of records reveal recent evaluation for reflux and "that she felt her nerves were upset but having to stay at home all the time due to coronavirus.  She was seen in her primary care office on July 17 of this year.  At that time, she was diagnosed with adjustment reaction with anxiety and depression.  She was started on Celexa.  Past Medical History:  Diagnosis Date  . Acute ischemic stroke ([facility]) 12/28/2015   [name] 12/28/2015  . Arthritis    "hands" (12/28/2015)  . GERD (gastroesophageal reflux disease)   . High cholesterol   . Hypertension   . PONV (postoperative nausea and vomiting)    "w/1st hip replacement"  . Symptomatic cholelithiasis     Patient Active Problem List   Diagnosis Date Noted  . PSVT (paroxysmal supraventricular tachycardia) (HCC) 02/21/2018  . Hyperlipidemia 08/28/2016  . AKI (acute kidney injury) ([facility])   . Frequent stools   . Sleep disturbance   . Hemiparesis of right nondominant side due to cerebral infarction   . Benign essential HTN   . 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 Ataxia, late effect of cerebrovascular disease   . Alteration of sensation as late effect of stroke   . CVA (cerebral vascular accident) ([facility]) 12/29/2015  . Left sided numbness 12/28/2015   . Nausea & vomiting 12/28/2015  . Diarrhea 12/28/2015  . Essential hypertension 12/28/2015  . Acute encephalopathy 12/23/2015  . UTI (urinary tract infection) 12/23/2015  . Hyponatremia 12/23/2015  . Dehydration with hyponatremia 12/23/2015    Past Surgical History:  Procedure Laterality Date  . CATARACT EXTRACTION W/ INTRAOCULAR LENS IMPLANT Right ~ 2011  . CHOLECYSTECTOMY N/A 08/17/2017   Procedure: LAPAROSCOPIC CHOLECYSTECTOMY WITH INTRAOPERATIVE CHOLANGIOGRAM ERAS PATHWAY;  Surgeon: Jimmye NormanWyatt, James, MD;  [facility];  Service: General;  Laterality: N/A;  . HIP CLOSED REDUCTION Right 09/29/2016   Procedure: CLOSED REDUCTION RIGHT HIP;  Surgeon: Samson FredericSwinteck, Brian, MD;  Location: [facility] OR;  Service: Orthopedics;  Laterality: Right;  . JOINT REPLACEMENT    . TOTAL HIP ARTHROPLASTY  2007-2008   left-right     OB History   No obstetric history on file.      Home Medications    Prior to Admission medications   Medication Sig Start Date End Date Taking? Authorizing Provider  amLODipine (NORVASC) 10 MG tablet Take 1 tablet (10 mg total) by mouth daily. 04/10/18   Patwardhan, Anabel BeneManish J, MD  aspirin EC 81 MG tablet Take 1 tablet (81 mg total) by mouth daily. 02/21/18 02/21/19  Alessandra BevelsKamineni, Neelima, MD  Calcium Carb-Cholecalciferol (CALCIUM 600+D3 PO) Take 1 tablet by mouth 2 (two) times daily.    [provider]  latanoprost (XALATAN) 0.005 % ophthalmic solution Place 1 drop into both eyes at  bedtime.    [provider]  lisinopril (PRINIVIL,ZESTRIL) 20 MG tablet Take 1 tablet (20 mg total) by mouth daily. 04/10/18 07/11/18  Patwardhan, Reynold Bowen, MD  Multiple Vitamin (MULTI-VITAMINS) TABS Take 1 tablet by mouth daily.     [provider]  Omega-3 Fatty Acids (FISH OIL PO) Take 1 tablet by mouth daily.    [provider]  omeprazole (PRILOSEC) 40 MG capsule Take 40 mg by mouth 2 (two) times a day.    [provider]  pantoprazole (PROTONIX) 40 MG tablet  Take 1 tablet (40 mg total) by mouth daily. Patient not taking: Reported on 07/11/2018 01/06/16   Angiulli, Lavon Paganini, PA-C  rosuvastatin (CRESTOR) 5 MG tablet Take 1 tablet (5 mg total) by mouth daily at 6 PM. 01/06/16   Angiulli, Lavon Paganini, PA-C  sotalol (BETAPACE) 80 MG tablet Take 1 tablet (80 mg total) by mouth daily. 04/10/18   Patwardhan, Reynold Bowen, MD    Family History Family History  Problem Relation Age of Onset  . CAD Father   . Stroke Father   . Alcohol abuse Brother   . CAD Brother   . Aneurysm Brother     Social History Social History   Tobacco Use  . Smoking status: Never Smoker  . Smokeless tobacco: Never Used  Substance Use Topics  . Alcohol use: No  . Drug use: No     Allergies   Ativan [lorazepam], Glucosamine sulfate, Niacin, Phenergan [promethazine], and Amoxicillin   Review of Systems Review of Systems  Unable to perform ROS: Mental status change     Physical Exam Updated Vital Signs BP (!) 122/54   Pulse 66   Temp (!) 97 F (36.1 C) (Rectal)   Resp 17   SpO2 96%   Physical Exam Vitals signs and nursing note reviewed.  Constitutional:      General: She is not in acute distress.    Appearance: Normal appearance. She is normal weight.  HENT:     Head: Normocephalic and atraumatic.     Right Ear: External ear normal.     Left Ear: External ear normal.     Nose: Nose normal.     Mouth/Throat:     Comments: Patient keeps mouth from a closed normal Eyes:     Pupils: Pupils are equal, round, and reactive to light.     Comments:  all instructions to open reliably open patient keeps eyes closed against resistance was able to pry them open and visualize pupils which appear to be equal  Neck:     Musculoskeletal: Normal range of motion.  Cardiovascular:     Rate and Rhythm: Normal rate and regular rhythm.     Pulses: Normal pulses.     Heart sounds: Normal heart sounds.  Pulmonary:     Effort: Pulmonary effort is normal.     Breath sounds:  Normal breath sounds.  Abdominal:     General: Abdomen is flat.     Palpations: Abdomen is soft.  Musculoskeletal: Normal range of motion.        General: No swelling, tenderness or signs of injury.  Skin:    General: Skin is warm and dry.     Capillary Refill: Capillary refill takes less than 2 seconds.  Neurological:     Mental Status: She is alert.     Cranial Nerves: No cranial nerve deficit.     Comments:  Patient does not reply to verbal or painful stimuli Patient is  able to hold all extremities up against resistance but keeps them in position for extended period time until she moves them into a more comfortable position on her head or on the stretcher No cogwheeling No stiffness  Psychiatric:        Speech: She is noncommunicative.        Behavior: Behavior is slowed and withdrawn.      ED Treatments / Results  Labs (all labs ordered are listed, but only abnormal results are displayed) Labs Reviewed  CBC - Abnormal; Notable for the following components:      Result Value   MCHC 37.3 (*)    RDW 11.4 (*)    All other components within normal limits  DIFFERENTIAL - Abnormal; Notable for the following components:   Abs Immature Granulocytes 0.08 (*)    All other components within normal limits  POCT I-STAT EG7 - Abnormal; Notable for the following components:   pH, Ven 7.464 (*)    pCO2, Ven 35.7 (*)    pO2, Ven 66.0 (*)    Sodium 121 (*)    Calcium, Ion 1.10 (*)    All other components within normal limits  PROTIME-INR  APTT  ETHANOL  COMPREHENSIVE METABOLIC PANEL  RAPID URINE DRUG SCREEN, HOSP PERFORMED  URINALYSIS, ROUTINE W REFLEX MICROSCOPIC    EKG EKG Interpretation  Date/Time:  Saturday August 24 2018 13:21:12 EDT Ventricular Rate:  69 PR Interval:    QRS Duration: 121 QT Interval:  456 QTC Calculation: 489 R Axis:   83 Text Interpretation:  Normal sinus rhythm Poor R wave progression Non-specific ST-t changes Confirmed by Margarita Grizzleay, Fontaine Hehl [phone]([id number]) on  08/24/2018 2:51:00 PM   Radiology Ct Head Wo Contrast  Result Date: 08/24/2018 CLINICAL DATA:  [age] year old female with altered level of consciousness. EXAM: CT HEAD WITHOUT CONTRAST TECHNIQUE: Contiguous axial images were obtained from the base of the skull through the vertex without intravenous contrast. COMPARISON:  12/28/2015 FINDINGS: Brain: No evidence of acute infarction, hemorrhage, hydrocephalus, extra-axial collection or mass lesion/mass effect. Atrophy and chronic small-vessel white matter ischemic changes again noted. Vascular: Carotid atherosclerotic calcifications again noted. Skull: Normal. Negative for fracture or focal lesion. Sinuses/Orbits: Chronic mucosal thickening in scattered ethmoid air cells and LEFT sphenoid sinus again noted. No acute abnormality. Other: None IMPRESSION: 1. No evidence of acute intracranial abnormality 2. Atrophy and chronic small-vessel white matter ischemic changes. Electronically Signed   By: Harmon PierJeffrey  Hu M.D.   On: 08/24/2018 15:00    Procedures .Critical Care Performed by: Margarita Grizzleay, Amauri Medellin, MD Authorized by: Margarita Grizzleay, Markelle Asaro, MD   Critical care provider statement:    Critical care time (minutes):  45   Critical care end time:  08/24/2018 3:34 PM   Critical care was necessary to treat or prevent imminent or life-threatening deterioration of the following conditions:  CNS failure or compromise and metabolic crisis   Critical care was time spent personally by me on the following activities:  Discussions with consultants, evaluation of patient's response to treatment, examination of patient, ordering and performing treatments and interventions, ordering and review of laboratory studies, ordering and review of radiographic studies, pulse oximetry, re-evaluation of patient's condition, obtaining history from patient or surrogate and review of old charts   (including critical care time)  Medications Ordered in ED Medications  midazolam (VERSED) injection 2 mg  (2 mg Intravenous Given 08/24/18 1337)     Initial Impression / Assessment and Plan / ED Course  I have reviewed the triage vital signs and the  nursing notes.  Pertinent labs & imaging results that were available during my care of the patient were reviewed by me and considered in my medical decision making (see chart for details).        85 year old female who presents today with altered mental status nonfocal deficits.  Suspect there is a psychiatric component.  I am proceeding with medical work-up to include head CT and complete stroke work-up at this time.  If all this is normal I will obtain neurology consult. Hyponatremia AMS Consider postictal/toxic/ secondary to hyponatremia and above/vs psychiatric disorder/vs stroke  Discussed with IM resident and will see for further evaluation  3:33 PM Patient with spontaneous eye opening but remains noncommunicative Final Clinical Impressions(s) / ED Diagnoses   Final diagnoses:  Altered mental status, unspecified altered mental status type  Hyponatremia    ED Discharge Orders    None       Margarita Grizzleay, Marissah Vandemark, MD 08/24/18 1424    Margarita Grizzleay, Twinkle Sockwell, MD 08/24/18 1534

## 2018-08-24 NOTE — ED Notes (Signed)
Patient transported to CT 

## 2018-08-24 NOTE — ED Notes (Signed)
Help get patient undress on the monitor did ekg shown to Dr Ray patient is resting with call bell in reach 

## 2018-08-24 NOTE — ED Notes (Signed)
Report given to [place], Therapist, sports on 5W

## 2018-08-24 NOTE — ED Notes (Signed)
ED TO INPATIENT HANDOFF REPORT  ED Nurse Name and Phone #: Lorrin Goodell [phone]  S Name/Age/Gender [name] 85 y.o. female Room/Bed: 041C/041C  Code Status   Code Status: DNR  Home/SNF/Other Home Patient oriented to: self, time and situation Is this baseline? No   Triage Complete: Triage complete  Chief Complaint Altered  Triage Note Pt here from home with c/o AMS lsn 0930 talked to by family , cbg 138 VSS   Allergies Allergies  Allergen Reactions  . Ativan [Lorazepam] Other (See Comments)    altered  . Glucosamine Sulfate Other (See Comments)    unknown  . Niacin Nausea And Vomiting  . Phenergan [Promethazine] Other (See Comments)    "Seeing things"    . Amoxicillin Rash    Level of Care/Admitting Diagnosis ED Disposition    ED Disposition Condition [facility] Area: [place] [[id number]]  Level of Care: Telemetry Medical [104]  Covid Evaluation: Asymptomatic Screening Protocol (No Symptoms)  Diagnosis: Acute encephalopathy [269485]  Admitting Physician: Axel Filler [[id number]]  Attending Physician: Axel Filler [[id number]]  Estimated length of stay: past midnight tomorrow  Certification:: I certify this patient will need inpatient services for at least 2 midnights  PT Class (Do Not Modify): Inpatient [101]  PT Acc Code (Do Not Modify): Private [1]       B Medical/Surgery History Past Medical History:  Diagnosis Date  . Acute ischemic stroke ([place]) 12/28/2015   Archie Endo 12/28/2015  . Arthritis    "hands" (12/28/2015)  . GERD (gastroesophageal reflux disease)   . High cholesterol   . Hypertension   . PONV (postoperative nausea and vomiting)    "w/1st hip replacement"  . Symptomatic cholelithiasis    Past Surgical History:  Procedure Laterality Date  . CATARACT EXTRACTION W/ INTRAOCULAR LENS IMPLANT Right ~ 2011  . CHOLECYSTECTOMY N/A 08/17/2017   Procedure: LAPAROSCOPIC CHOLECYSTECTOMY WITH  INTRAOPERATIVE CHOLANGIOGRAM ERAS PATHWAY;  Surgeon: Judeth Horn, MD;  Location: [place];  Service: General;  Laterality: N/A;  . HIP CLOSED REDUCTION Right 09/29/2016   Procedure: CLOSED REDUCTION RIGHT HIP;  Surgeon: Rod Can, MD;  Location: [place];  Service: Orthopedics;  Laterality: Right;  . JOINT REPLACEMENT    . TOTAL HIP ARTHROPLASTY  2007-2008   left-right     A IV Location/Drains/Wounds Patient Lines/Drains/Airways Status   Active Line/Drains/Airways    Name:   Placement date:   Placement time:   Site:   Days:   Peripheral IV 08/24/18 Left Antecubital   08/24/18    1324    Antecubital   less than 1   Incision (Closed) 08/17/17 Abdomen Other (Comment)   08/17/17    1233     372          Intake/Output Last 24 hours  Intake/Output Summary (Last 24 hours) at 08/24/2018 2057 Last data filed at 08/24/2018 1621 Gross per 24 hour  Intake 1000 ml  Output -  Net 1000 ml    Labs/Imaging Results for orders placed or performed during the hospital encounter of 08/24/18 (from the past 48 hour(s))  Ethanol     Status: None   Collection Time: 08/24/18  1:28 PM  Result Value Ref Range   Alcohol, Ethyl (B) <10 <10 mg/dL    Comment: (NOTE) Lowest detectable limit for serum alcohol is 10 mg/dL. For medical purposes only. Performed at [facility], [place] [address], [place], [place] [address]   Protime-INR  Status: None   Collection Time: 08/24/18  1:28 PM  Result Value Ref Range   Prothrombin Time 12.9 11.4 - 15.2 seconds   INR 1.0 0.8 - 1.2    Comment: (NOTE) INR goal varies based on device and disease states. Performed at [facility], [address] [place], [place] [address]   APTT     Status: None   Collection Time: 08/24/18  1:28 PM  Result Value Ref Range   aPTT 29 24 - 36 seconds    Comment: Performed at [facility], [address] [place], [place] [address]  CBC     Status: Abnormal   Collection Time: 08/24/18  1:28 PM  Result Value Ref  Range   WBC 5.9 4.0 - 10.5 K/uL   RBC 4.83 3.87 - 5.11 MIL/uL   Hemoglobin 14.8 12.0 - 15.0 g/dL   HCT 09.8 11.9 - 14.7 %   MCV 82.2 80.0 - 100.0 fL   MCH 30.6 26.0 - 34.0 pg   MCHC 37.3 (H) 30.0 - 36.0 g/dL    Comment: REPEATED TO VERIFY CORRECTED FOR COLD AGGLUTININS    RDW 11.4 (L) 11.5 - 15.5 %   Platelets 213 150 - 400 K/uL   nRBC 0.0 0.0 - 0.2 %    Comment: Performed at [facility], [address], [place], [place] [address]  Differential     Status: Abnormal   Collection Time: 08/24/18  1:28 PM  Result Value Ref Range   Neutrophils Relative % 54 %   Neutro Abs 3.2 1.7 - 7.7 K/uL   Lymphocytes Relative 30 %   Lymphs Abs 1.8 0.7 - 4.0 K/uL   Monocytes Relative 15 %   Monocytes Absolute 0.9 0.1 - 1.0 K/uL   Eosinophils Relative 0 %   Eosinophils Absolute 0.0 0.0 - 0.5 K/uL   Basophils Relative 0 %   Basophils Absolute 0.0 0.0 - 0.1 K/uL   Immature Granulocytes 1 %   Abs Immature Granulocytes 0.08 (H) 0.00 - 0.07 K/uL    Comment: Performed at [facility], [address] [place], [place] [address]  Comprehensive metabolic panel     Status: Abnormal   Collection Time: 08/24/18  1:28 PM  Result Value Ref Range   Sodium 121 (L) 135 - 145 mmol/L   Potassium 3.8 3.5 - 5.1 mmol/L   Chloride 86 (L) 98 - 111 mmol/L   CO2 23 22 - 32 mmol/L   Glucose, Bld 117 (H) 70 - 99 mg/dL   BUN 13 8 - 23 mg/dL   Creatinine, Ser 6.57 0.44 - 1.00 mg/dL   Calcium 9.2 8.9 - 84.6 mg/dL   Total Protein 6.3 (L) 6.5 - 8.1 g/dL   Albumin 4.1 3.5 - 5.0 g/dL   AST 22 15 - 41 U/L   ALT 14 0 - 44 U/L   Alkaline Phosphatase 58 38 - 126 U/L   Total Bilirubin 1.7 (H) 0.3 - 1.2 mg/dL   GFR calc non Af Amer >60 >60 mL/min   GFR calc Af Amer >60 >60 mL/min   Anion gap 12 5 - 15    Comment: Performed at [facility], [address], [place], [place] [address]  POCT I-Stat EG7     Status: Abnormal   Collection Time: 08/24/18  1:35 PM  Result Value Ref Range   pH, Ven 7.464 (H)  7.250 - 7.430   pCO2, Ven 35.7 (L) 44.0 - 60.0  mmHg   pO2, Ven 66.0 (H) 32.0 - 45.0 mmHg   Bicarbonate 25.7 20.0 - 28.0 mmol/L   TCO2 27 22 - 32 mmol/L   O2 Saturation 94.0 %   Acid-Base Excess 2.0 0.0 - 2.0 mmol/L   Sodium 121 (L) 135 - 145 mmol/L   Potassium 3.8 3.5 - 5.1 mmol/L   Calcium, Ion 1.10 (L) 1.15 - 1.40 mmol/L   HCT 42.0 36.0 - 46.0 %   Hemoglobin 14.3 12.0 - 15.0 g/dL   Patient temperature HIDE    Sample type VENOUS   Urine rapid drug screen (hosp performed)     Status: Abnormal   Collection Time: 08/24/18  4:17 PM  Result Value Ref Range   Opiates NONE DETECTED NONE DETECTED   Cocaine NONE DETECTED NONE DETECTED   Benzodiazepines POSITIVE (A) NONE DETECTED   Amphetamines NONE DETECTED NONE DETECTED   Tetrahydrocannabinol NONE DETECTED NONE DETECTED   Barbiturates NONE DETECTED NONE DETECTED    Comment: (NOTE) DRUG SCREEN FOR MEDICAL PURPOSES ONLY.  IF CONFIRMATION IS NEEDED FOR ANY PURPOSE, NOTIFY LAB WITHIN 5 DAYS. LOWEST DETECTABLE LIMITS FOR URINE DRUG SCREEN Drug Class                     Cutoff (ng/mL) Amphetamine and metabolites    1000 Barbiturate and metabolites    200 Benzodiazepine                 200 Tricyclics and metabolites     300 Opiates and metabolites        300 Cocaine and metabolites        300 THC                            50 Performed at [facility], [address] [place], [place] [address]   Urinalysis, Routine w reflex microscopic     Status: Abnormal   Collection Time: 08/24/18  4:17 PM  Result Value Ref Range   Color, Urine STRAW (A) YELLOW   APPearance CLEAR CLEAR   Specific Gravity, Urine 1.004 (L) 1.005 - 1.030   pH 7.0 5.0 - 8.0   Glucose, UA NEGATIVE NEGATIVE mg/dL   Hgb urine dipstick SMALL (A) NEGATIVE   Bilirubin Urine NEGATIVE NEGATIVE   Ketones, ur 5 (A) NEGATIVE mg/dL   Protein, ur NEGATIVE NEGATIVE mg/dL   Nitrite NEGATIVE NEGATIVE   Leukocytes,Ua NEGATIVE NEGATIVE   RBC / HPF 6-10 0 - 5 RBC/hpf    WBC, UA 0-5 0 - 5 WBC/hpf   Bacteria, UA NONE SEEN NONE SEEN    Comment: Performed at [facility], [address] [place], [place] [address]  SARS Coronavirus 2 (CEPHEID - Performed in [facility] hospital lab), Hosp Order     Status: None   Collection Time: 08/24/18  4:17 PM   Specimen: Nasopharyngeal  Result Value Ref Range   SARS Coronavirus 2 NEGATIVE NEGATIVE    Comment: (NOTE) If result is NEGATIVE SARS-CoV-2 target nucleic acids are NOT DETECTED. The SARS-CoV-2 RNA is generally detectable in upper and lower  respiratory specimens during the acute phase of infection. The lowest  concentration of SARS-CoV-2 viral copies this assay can detect is 250  copies / mL. A negative result does not preclude SARS-CoV-2 infection  and should not be used as the sole basis for treatment or other  patient management decisions.  A negative result may occur with  improper specimen collection / handling, submission of specimen other  than nasopharyngeal swab, presence of viral mutation(s) within the  areas targeted by this assay, and inadequate number of viral copies  (<250 copies / mL). A negative result must be combined with clinical  observations, patient history, and epidemiological information. If result is POSITIVE SARS-CoV-2 target nucleic acids are DETECTED. The SARS-CoV-2 RNA is generally detectable in upper and lower  respiratory specimens dur ing the acute phase of infection.  Positive  results are indicative of active infection with SARS-CoV-2.  Clinical  correlation with patient history and other diagnostic information is  necessary to determine patient infection status.  Positive results do  not rule out bacterial infection or co-infection with other viruses. If result is PRESUMPTIVE POSTIVE SARS-CoV-2 nucleic acids MAY BE PRESENT.   A presumptive positive result was obtained on the submitted specimen  and confirmed on repeat testing.  While 2019 novel coronavirus   (SARS-CoV-2) nucleic acids may be present in the submitted sample  additional confirmatory testing may be necessary for epidemiological  and / or clinical management purposes  to differentiate between  SARS-CoV-2 and other Sarbecovirus currently known to infect humans.  If clinically indicated additional testing with an alternate test  methodology [id number]) is advised. The SARS-CoV-2 RNA is generally  detectable in upper and lower respiratory sp ecimens during the acute  phase of infection. The expected result is Negative. Fact Sheet for Patients:  BoilerBrush.com.cyhttps://www.fda.gov/media/136312/download Fact Sheet for Healthcare Providers: https://pope.com/https://www.fda.gov/media/136313/download This test is not yet approved or cleared by the [place] FDA and has been authorized for detection and/or diagnosis of SARS-CoV-2 by FDA under an Emergency Use Authorization (EUA).  This EUA will remain in effect (meaning this test can be used) for the duration of the COVID-19 declaration under Section 564(b)(1) of the Act, 21 U.S.C. section 360bbb-3(b)(1), unless the authorization is terminated or revoked sooner. Performed at [facility], [address] [place], [place] [address]   Sodium, urine, random     Status: None   Collection Time: 08/24/18  4:17 PM  Result Value Ref Range   Sodium, Ur 37 mmol/L    Comment: Performed at [facility], [address] [place], [place] [address]  Osmolality, urine     Status: Abnormal   Collection Time: 08/24/18  4:17 PM  Result Value Ref Range   Osmolality, Ur 184 (L) 300 - 900 mOsm/kg    Comment: Performed at [facility], [address] [place], [place] [address]  Basic metabolic panel     Status: Abnormal   Collection Time: 08/24/18  5:14 PM  Result Value Ref Range   Sodium 126 (L) 135 - 145 mmol/L   Potassium 4.0 3.5 - 5.1 mmol/L   Chloride 93 (L) 98 - 111 mmol/L   CO2 19 (L) 22 - 32 mmol/L   Glucose, Bld 105 (H) 70 - 99 mg/dL   BUN 11 8 - 23  mg/dL   Creatinine, Ser 2.950.51 0.44 - 1.00 mg/dL   Calcium 8.8 (L) 8.9 - 10.3 mg/dL   GFR calc non Af Amer >60 >60 mL/min   GFR calc Af Amer >60 >60 mL/min   Anion gap 14 5 - 15    Comment: Performed at [facility], [address] [place], [place] [address]  TSH     Status: None   Collection Time: 08/24/18  5:14 PM  Result Value Ref Range   TSH 3.096 0.350 - 4.500  uIU/mL    Comment: Performed by a 3rd Generation assay with a functional sensitivity of <=0.01 uIU/mL. Performed at [facility], [address] [place], [place] [address]   CK     Status: None   Collection Time: 08/24/18  5:14 PM  Result Value Ref Range   Total CK 45 38 - 234 U/L    Comment: Performed at [facility], [address] [place], [place] [address]  Osmolality     Status: Abnormal   Collection Time: 08/24/18  5:14 PM  Result Value Ref Range   Osmolality 259 (L) 275 - 295 mOsm/kg    Comment: Performed at [facility], [address], [place], [place] [address]   Ct Head Wo Contrast  Result Date: 08/24/2018 CLINICAL DATA:  85 year old female with altered level of consciousness. EXAM: CT HEAD WITHOUT CONTRAST TECHNIQUE: Contiguous axial images were obtained from the base of the skull through the vertex without intravenous contrast. COMPARISON:  12/28/2015 FINDINGS: Brain: No evidence of acute infarction, hemorrhage, hydrocephalus, extra-axial collection or mass lesion/mass effect. Atrophy and chronic small-vessel white matter ischemic changes again noted. Vascular: Carotid atherosclerotic calcifications again noted. Skull: Normal. Negative for fracture or focal lesion. Sinuses/Orbits: Chronic mucosal thickening in scattered ethmoid air cells and LEFT sphenoid sinus again noted. No acute abnormality. Other: None IMPRESSION: 1. No evidence of acute intracranial abnormality 2. Atrophy and chronic small-vessel white matter ischemic changes. Electronically Signed   By: Harmon Pier M.D.   On:  08/24/2018 15:00   Mr Brain Wo Contrast  Result Date: 08/24/2018 CLINICAL DATA:  Found unresponsive.  Encephalopathy.  Hyponatremia. EXAM: MRI HEAD WITHOUT CONTRAST TECHNIQUE: Multiplanar, multiecho pulse sequences of the brain and surrounding structures were obtained without intravenous contrast. COMPARISON:  CT head without contrast 08/24/2018. MRI brain 12/28/2015 FINDINGS: Brain: Diffusion-weighted images demonstrate no acute or subacute infarction. No areas of restricted diffusion are present. Advanced atrophy and diffuse white matter disease is similar to the prior exam. Dilated perivascular spaces are again seen throughout the basal ganglia. There is a remote lacunar infarct of the right thalamus. White matter changes extend into the brainstem. Cerebellum is normal. Vascular: Flow is present in the major intracranial arteries. Skull and upper cervical spine: Degenerative changes are present in the upper cervical spine without significant change. Craniocervical junction is normal. Midline structures are otherwise within normal limits. Sinuses/Orbits: Chronic posterior left ethmoid and sinus mucosal disease is present. There are no fluid levels. The paranasal sinuses and mastoid air cells are otherwise clear. A right lens replacement is present. Globes and orbits are within normal limits. CS IMPRESSION: 1. No acute or global intracranial abnormality to explain the patient's encephalopathy. 2. Advanced atrophy and diffuse white matter disease is similar the prior study. This likely reflects the sequela of chronic microvascular ischemia. 3. Remote lacunar infarct of the right thalamus. 4. Stable chronic sinus disease. Electronically Signed   By: Marin Roberts M.D.   On: 08/24/2018 20:37   Dg Chest Port 1 View  Result Date: 08/24/2018 CLINICAL DATA:  AMS, last seen normal today at 0930 per family. Pt unresponsive to questions. Hx of HTN, acute ischemic stroke. EXAM: PORTABLE CHEST 1 VIEW COMPARISON:   03/20/2018 FINDINGS: The heart is mildly enlarged. The lungs are free of focal consolidations and pleural effusions. No pulmonary edema. Chronic changes in both shoulders. IMPRESSION: Cardiomegaly. No evidence for acute pulmonary abnormality. Electronically Signed   By: Norva Pavlov M.D.   On: 08/24/2018 17:34  Pending Labs Unresulted Labs (From admission, onward)    Start     Ordered   08/31/18 0500  Creatinine, serum  (enoxaparin (LOVENOX)    CrCl >/= 30 ml/min)  Weekly,   R    Comments: while on enoxaparin therapy    08/24/18 1628   08/25/18 0500  CBC  Tomorrow morning,   R     08/24/18 1628   08/24/18 1530  Basic metabolic panel  STAT Now then every 4 hours ,   R (with STAT occurrences)     08/24/18 1534          Vitals/Pain Today's Vitals   08/24/18 1857 08/24/18 1900 08/24/18 2027 08/24/18 2030  BP: (!) 156/60 (!) 150/59 (!) 150/60 (!) 152/56  Pulse: 83 83 78 78  Resp: 14 15  14   Temp:      TempSrc:      SpO2: 99% 99% 99% 98%  PainSc:        Isolation Precautions No active isolations  Medications Medications  enoxaparin (LOVENOX) injection 40 mg (has no administration in time range)  sodium chloride flush (NS) 0.9 % injection 3 mL (has no administration in time range)  acetaminophen (TYLENOL) tablet 650 mg (has no administration in time range)    Or  acetaminophen (TYLENOL) suppository 650 mg (has no administration in time range)  midazolam (VERSED) injection 2 mg (2 mg Intravenous Given 08/24/18 1337)  sodium chloride 0.9 % bolus 1,000 mL (0 mLs Intravenous Stopped 08/24/18 1631)    Mobility walks High fall risk   Focused Assessments Neuro Assessment Handoff:  Swallow screen pass? No          Neuro Assessment: Exceptions to WDL Neuro Checks:      Last Documented NIHSS Modified Score:   Has TPA been given? No If patient is a Neuro Trauma and patient is going to OR before floor call report to 4N Charge nurse: [phone] or  [phone]     R Recommendations: See Admitting Provider Note  Report given to:   Additional Notes:

## 2018-08-24 NOTE — ED Triage Notes (Signed)
Pt here from home with c/o AMS lsn 0930 talked to by family , cbg 138 VSS

## 2018-08-24 NOTE — ED Notes (Signed)
Patient transported to MRI 

## 2018-08-24 NOTE — H&P (Addendum)
Date: 08/24/2018               Patient Name:  [name] MRN: [id number]  DOB: 01-07-1934 Age / Sex: 85 y.o., female   PCP: Heywood Bene, PA-C         Medical Service: Internal Medicine Teaching Service         Attending Physician: Dr. Pattricia Boss, MD    First Contact: Dr. Court Joy Pager: [phone]  Second Contact: Dr.Melvin Pager: [phone]       After Hours (After 5p/  First Contact Pager: [phone]  weekends / holidays): Second Contact Pager: [phone]   Chief Complaint: AMS  History of Present Illness: HPI collected from record and phone conversation with daughter.  85 year old female with past medical history of hypertension, GERD , thalamic infarct in 2017, and recent diagnosis of adjustment reaction with anxiety and depression.  Patient's daughter says she spoke to her mom last at 930 this morning on the phone.  She had called her to see what she was eating because her mom had lost her appetite for the past 2 or 3 days.  Patient had just eaten some pudding and oatmeal.  She says she knows her mom had taken antiemetic at some point in the past 2 days to help with nausea. Does not beleive her mom had vomited or had any diarrhea.  In addition she says her mother has been complaining about chronic pain in her side since gallbladder removal.  When asked about the recent diagnosis of adjustment reaction with anxiety and depression daughter says mother was prescribed a medication but did not pick it up.  Her chart looks like she was prescribed Celexa and not given due to interactions by pharmacist.  Patient also prescribed Zoloft per chart, but patient's daughter says she does not believe her mother has taken this either.  Daughter found her mother at 39 and she would open her eyes but was unresponsive.  Daughter said it was not apparent that she had fell, did not notice any vomit, rhythmic movements or anything else that she could add to the history.  The patient was fully  functioning up to this point, she drove and performed her own ADLs and had IADLs.  Meds:  No current facility-administered medications on file prior to encounter.    Current Outpatient Medications on File Prior to Encounter  Medication Sig   amLODipine (NORVASC) 10 MG tablet Take 1 tablet (10 mg total) by mouth daily.   aspirin EC 81 MG tablet Take 1 tablet (81 mg total) by mouth daily.   Calcium Carb-Cholecalciferol (CALCIUM 600+D3 PO) Take 1 tablet by mouth 2 (two) times daily.   latanoprost (XALATAN) 0.005 % ophthalmic solution Place 1 drop into both eyes at bedtime.   lisinopril (PRINIVIL,ZESTRIL) 20 MG tablet Take 1 tablet (20 mg total) by mouth daily.   Multiple Vitamin (MULTI-VITAMINS) TABS Take 1 tablet by mouth daily.    Omega-3 Fatty Acids (FISH OIL PO) Take 1 tablet by mouth daily.   omeprazole (PRILOSEC) 40 MG capsule Take 40 mg by mouth 2 (two) times a day.   pantoprazole (PROTONIX) 40 MG tablet Take 1 tablet (40 mg total) by mouth daily. (Patient not taking: Reported on 07/11/2018)   rosuvastatin (CRESTOR) 5 MG tablet Take 1 tablet (5 mg total) by mouth daily at 6 PM.   sotalol (BETAPACE) 80 MG tablet Take 1 tablet (80 mg total) by mouth daily.    No outpatient medications have  been marked as taking for the 08/24/18 encounter [facility](Hospital Encounter).     Allergies: Allergies as of 08/24/2018 - Review Complete 07/11/2018  Allergen Reaction Noted   Ativan [lorazepam] Other (See Comments) 12/23/2015   Glucosamine sulfate Other (See Comments) 08/11/2015   Niacin Nausea And Vomiting 08/16/2015   Phenergan [promethazine] Other (See Comments) 12/28/2015   Amoxicillin Rash 08/11/2015   Past Medical History:  Diagnosis Date   Acute ischemic stroke (HCC) 12/28/2015   [name] 12/28/2015   Arthritis    "hands" (12/28/2015)   GERD (gastroesophageal reflux disease)    High cholesterol    Hypertension    PONV (postoperative nausea and vomiting)    "w/1st hip  replacement"   Symptomatic cholelithiasis     Family History:  Family History  Problem Relation Age of Onset   CAD Father    Stroke Father    Alcohol abuse Brother    CAD Brother    Aneurysm Brother      Social History:  Social History   Tobacco Use   Smoking status: Never Smoker   Smokeless tobacco: Never Used  Substance Use Topics   Alcohol use: No   Drug use: No     Review of Systems: A complete ROS was negative except as per HPI.   Physical Exam: Blood pressure (!) 167/63, pulse 77, temperature (!) 97 F (36.1 C), temperature source Rectal, resp. rate (!) 21, SpO2 100 %.  Physical Exam  Eyes: Pupils are equal, round, and reactive to light.  Cardiovascular: Normal rate and regular rhythm.  No murmur heard. Pulmonary/Chest: Breath sounds normal.  Abdominal: Soft. Bowel sounds are normal.  Musculoskeletal:        General: No deformity or edema.  Neurological: She exhibits abnormal muscle tone.  Mental Status: Glasgow coma scale: 5 (opens eyes to verbal command), No verbal response, no response to pain Cranial Nerves: unable to fully assess Rigid on exam , more pronounced in LE  No babinski sign    Skin: Skin is warm. No rash noted.  Psychiatric:  Able to assess  Vitals reviewed.    Assessment & Plan by Problem: Active Problems:   * No active hospital problems. *  Renard MatterJenie Bunney is a 85 year old female with past medical history significant for thalamic infarct in 2017 and recent diagnosis of adjustment disorder who presents with altered mental status.  Last known normal 9:30 AM this morning and found down by daughter at 1130.  No evidence of acute intracranial abnormality on CT head without contrast.   Acute encephalopathy Physical exam points more to catatonia.  On exam patient is rigid, mute, and stares at the beginning of exam.  In addition patient sodium noted to be 121 and unclear if this is an acute or chronic problem.  No other apparent  abnormalities on CBC and CMP.  pH 7.46 on venous blood gas. UDS positive for benzos, which patient was given at some point during admission.  Ethanol undetectable level. This presentation does not fit trauma and CT without acute changes.  No clear intoxication, patient reported to not have started Zoloft and only took one antiemetic. We will continue further work-up for acute encephalopathy. - Consult neurology -MRI and EEG - CK -TSH - Serum osmolality, urine osmolality  Hyponatremia Sodium 121 on admission and patient started on 1 L NS.  Will recheck sodium and determine rate.  Goal less than 128 until 7/26 at 1 PM -BMP   Dispo: Admit patient to Inpatient with expected length of  stay greater than 2 midnights.  Signed:  Thurmon FairJeff Alon Mazor, MD PGY1  [phone]

## 2018-08-24 NOTE — ED Notes (Signed)
Pt incontinent of urine.  Pt cleaned and purewick placed on pt  Pt will open eyes but will not follow any commands at this time

## 2018-08-24 NOTE — Consult Note (Signed)
Requesting Physician: Dr. Lorenda [name]    Chief Complaint: Unresponsive  History obtained from: Patient and Chart     HPI:                                                                                                                                       [name] is a 85 y.o. female with past medical history of stroke, hyperlipidemia, hypertension, depression presents to the ED emergency department after daughter found patient unresponsive.  Per chart review, patient had been recently diagnosed with anxiety and depression and had lost her appetite. Her last known normal was around 8:30 AM.  On arrival to [facility] she was evaluated and thought to be altered due to hyponatremia and psychiatric disorder.  Neurology was consulted by internal medicine service for recommendations regarding patient's " comatose" state and felt this was not explained by hyponatremia.    Past Medical History:  Diagnosis Date  . Acute ischemic stroke ([place]) 12/28/2015   [name] 12/28/2015  . Arthritis    "hands" (12/28/2015)  . GERD (gastroesophageal reflux disease)   . High cholesterol   . Hypertension   . PONV (postoperative nausea and vomiting)    "w/1st hip replacement"  . Symptomatic cholelithiasis     Past Surgical History:  Procedure Laterality Date  . CATARACT EXTRACTION W/ INTRAOCULAR LENS IMPLANT Right ~ 2011  . CHOLECYSTECTOMY N/A 08/17/2017   Procedure: LAPAROSCOPIC CHOLECYSTECTOMY WITH INTRAOPERATIVE CHOLANGIOGRAM ERAS PATHWAY;  Surgeon: Kristen Horn, MD;  Location: [place];  Service: General;  Laterality: N/A;  . HIP CLOSED REDUCTION Right 09/29/2016   Procedure: CLOSED REDUCTION RIGHT HIP;  Surgeon: Kristen Can, MD;  Location: [place];  Service: Orthopedics;  Laterality: Right;  . JOINT REPLACEMENT    . TOTAL HIP ARTHROPLASTY  2007-2008   left-right    Family History  Problem Relation Age of Onset  . CAD Father   . Stroke Father   . Alcohol abuse Brother   . CAD Brother   .  Aneurysm Brother    Social History:  reports that she has never smoked. She has never used smokeless tobacco. She reports that she does not drink alcohol or use drugs.  Allergies:  Allergies  Allergen Reactions  . Ativan [Lorazepam] Other (See Comments)    altered  . Glucosamine Sulfate Other (See Comments)    unknown  . Niacin Nausea And Vomiting  . Phenergan [Promethazine] Other (See Comments)    "Seeing things"    . Amoxicillin Rash    Medications:  I reviewed home medications   ROS:                                                                                                                                     Unable to review systems due to patient's mental status   Examination:                                                                                                      General: Appears well-developed and well-nourished.  Psych: Affect appropriate to situation Eyes: No scleral injection HENT: No OP obstrucion Head: Normocephalic.  Cardiovascular: Normal rate and regular rhythm.  Respiratory: Effort normal and breath sounds normal to anterior ascultation GI: Soft.  No distension. There is no tenderness.  Skin: WDI    Neurological Examination Mental Status: Patient is somnolent, arouses to noxious stimuli and screams.  She forcefully closes her eyes.  She does not verbalize or follow any commands.  Upon raising her arm, sustained to position for prolonged period of time Cranial Nerves: II: Visual fields: Unable to assess as patient forcibly closes her eyes  III,IV, VI: ptosis not present, no gaze deviation, pupils equal, round, reactive to light and accommodation V,VII: smile symmetric, unable to assess sensation XII: midline tongue extension Motor: Right : Upper extremity   5/5    Left:     Upper extremity   5/5  Lower extremity    4+/5     Lower extremity   4+/5 Tone and bulk:normal tone throughout; no atrophy noted Sensory: Withdraws briskly to noxious stimulus on both sides, unable to perform detailed sensory exam due to mental status Deep Tendon Reflexes: 2+ and symmetric throughout Plantars: Right: downgoing   Left: downgoing Cerebellar: Unable to assess    Lab Results: Basic Metabolic Panel: Recent Labs  Lab 08/24/18 1328 08/24/18 1335 08/24/18 1714  NA 121* 121* 126*  K 3.8 3.8 4.0  CL 86*  --  93*  CO2 23  --  19*  GLUCOSE 117*  --  105*  BUN 13  --  11  CREATININE 0.65  --  0.51  CALCIUM 9.2  --  8.8*    CBC: Recent Labs  Lab 08/24/18 1328 08/24/18 1335  WBC 5.9  --   NEUTROABS 3.2  --   HGB 14.8 14.3  HCT 39.7 42.0  MCV 82.2  --   PLT 213  --     Coagulation Studies: Recent Labs    08/24/18 1328  LABPROT 12.9  INR 1.0    Imaging: Ct Head Wo Contrast  Result Date: 08/24/2018 CLINICAL DATA:  85 year old female with altered level of consciousness. EXAM: CT HEAD WITHOUT CONTRAST TECHNIQUE: Contiguous axial images were obtained from the base of the skull through the vertex without intravenous contrast. COMPARISON:  12/28/2015 FINDINGS: Brain: No evidence of acute infarction, hemorrhage, hydrocephalus, extra-axial collection or mass lesion/mass effect. Atrophy and chronic small-vessel white matter ischemic changes again noted. Vascular: Carotid atherosclerotic calcifications again noted. Skull: Normal. Negative for fracture or focal lesion. Sinuses/Orbits: Chronic mucosal thickening in scattered ethmoid air cells and LEFT sphenoid sinus again noted. No acute abnormality. Other: None IMPRESSION: 1. No evidence of acute intracranial abnormality 2. Atrophy and chronic small-vessel white matter ischemic changes. Electronically Signed   By: Harmon PierJeffrey  [name] M.D.   On: 08/24/2018 15:00   Dg Chest Port 1 View  Result Date: 08/24/2018 CLINICAL DATA:  AMS, last seen normal today at 0930 per family.  Pt unresponsive to questions. Hx of HTN, acute ischemic stroke. EXAM: PORTABLE CHEST 1 VIEW COMPARISON:  03/20/2018 FINDINGS: The heart is mildly enlarged. The lungs are free of focal consolidations and pleural effusions. No pulmonary edema. Chronic changes in both shoulders. IMPRESSION: Cardiomegaly. No evidence for acute pulmonary abnormality. Electronically Signed   By: Norva PavlovElizabeth  Brown M.D.   On: 08/24/2018 17:34     I have reviewed the above imaging : Moderate atrophy seen, no acute abnormality   ASSESSMENT AND PLAN  85 y.o. female with past medical history of stroke, hyperlipidemia, hypertension, depression presents to the ED emergency department after daughter found patient unresponsive.  On examination, patient is not comatose.  She appears to be more catatonic, protects her face when arm is raised.  Forcefully closes her eyes.  Metabolic encephalopathy likely secondary to hyponatremia.    Acute metabolic encephalopathy Symptomatic hyponatremia Catatonia  Recommendations Correct hyponatremia and metabolic disturbances Evaluate cause for hyponatremia, rule out SIADH [name] obtain MRI brain to rule out stroke although less likely given non-focal deficits Routine EEG Urine drug screen Hold antidepressants Psychiatry consult in the morning   Zakeya Junker [facility] Pager Number [phone]

## 2018-08-24 NOTE — ED Notes (Signed)
Pt is NSR on monitor 

## 2018-08-24 NOTE — ED Notes (Signed)
Admitting MD at bedside.

## 2018-08-24 NOTE — ED Notes (Signed)
Pt c/o pain on the right leg (10/10) and asks for some pain meds.

## 2018-08-25 ENCOUNTER — Inpatient Hospital Stay (HOSPITAL_COMMUNITY): Payer: Medicare Other

## 2018-08-25 ENCOUNTER — Encounter (HOSPITAL_COMMUNITY): Payer: Self-pay | Admitting: Student in an Organized Health Care Education/Training Program

## 2018-08-25 DIAGNOSIS — E871 Hypo-osmolality and hyponatremia: Secondary | ICD-10-CM | POA: Diagnosis present

## 2018-08-25 DIAGNOSIS — F4320 Adjustment disorder, unspecified: Secondary | ICD-10-CM

## 2018-08-25 DIAGNOSIS — Z66 Do not resuscitate: Secondary | ICD-10-CM

## 2018-08-25 LAB — BASIC METABOLIC PANEL
Anion gap: 14 (ref 5–15)
Anion gap: 13 (ref 5–15)
Anion gap: 12 (ref 5–15)
Anion gap: 9 (ref 5–15)
Anion gap: 7 (ref 5–15)
BUN: 7 mg/dL — ABNORMAL LOW (ref 8–23)
BUN: 8 mg/dL (ref 8–23)
BUN: 9 mg/dL (ref 8–23)
BUN: 8 mg/dL (ref 8–23)
BUN: 10 mg/dL (ref 8–23)
CO2: 20 mmol/L — ABNORMAL LOW (ref 22–32)
CO2: 21 mmol/L — ABNORMAL LOW (ref 22–32)
CO2: 21 mmol/L — ABNORMAL LOW (ref 22–32)
CO2: 22 mmol/L (ref 22–32)
CO2: 24 mmol/L (ref 22–32)
Calcium: 8.7 mg/dL — ABNORMAL LOW (ref 8.9–10.3)
Calcium: 8.6 mg/dL — ABNORMAL LOW (ref 8.9–10.3)
Calcium: 8.6 mg/dL — ABNORMAL LOW (ref 8.9–10.3)
Calcium: 8.7 mg/dL — ABNORMAL LOW (ref 8.9–10.3)
Calcium: 8.4 mg/dL — ABNORMAL LOW (ref 8.9–10.3)
Chloride: 91 mmol/L — ABNORMAL LOW (ref 98–111)
Chloride: 90 mmol/L — ABNORMAL LOW (ref 98–111)
Chloride: 92 mmol/L — ABNORMAL LOW (ref 98–111)
Chloride: 94 mmol/L — ABNORMAL LOW (ref 98–111)
Chloride: 96 mmol/L — ABNORMAL LOW (ref 98–111)
Creatinine, Ser: 0.57 mg/dL (ref 0.44–1.00)
Creatinine, Ser: 0.58 mg/dL (ref 0.44–1.00)
Creatinine, Ser: 0.58 mg/dL (ref 0.44–1.00)
Creatinine, Ser: 0.53 mg/dL (ref 0.44–1.00)
Creatinine, Ser: 0.74 mg/dL (ref 0.44–1.00)
GFR calc Af Amer: >60 mL/min (ref >60)
GFR calc Af Amer: >60 mL/min (ref >60)
GFR calc Af Amer: >60 mL/min (ref >60)
GFR calc Af Amer: >60 mL/min (ref >60)
GFR calc Af Amer: >60 mL/min (ref >60)
GFR calc non Af Amer: >60 mL/min (ref >60)
GFR calc non Af Amer: >60 mL/min (ref >60)
GFR calc non Af Amer: >60 mL/min (ref >60)
GFR calc non Af Amer: >60 mL/min (ref >60)
GFR calc non Af Amer: >60 mL/min (ref >60)
Glucose, Bld: 97 mg/dL (ref 70–99)
Glucose, Bld: 142 mg/dL — ABNORMAL HIGH (ref 70–99)
Glucose, Bld: 102 mg/dL — ABNORMAL HIGH (ref 70–99)
Glucose, Bld: 147 mg/dL — ABNORMAL HIGH (ref 70–99)
Glucose, Bld: 158 mg/dL — ABNORMAL HIGH (ref 70–99)
Potassium: 3.2 mmol/L — ABNORMAL LOW (ref 3.5–5.1)
Potassium: 3.2 mmol/L — ABNORMAL LOW (ref 3.5–5.1)
Potassium: 3.5 mmol/L (ref 3.5–5.1)
Potassium: 3.9 mmol/L (ref 3.5–5.1)
Potassium: 3.3 mmol/L — ABNORMAL LOW (ref 3.5–5.1)
Sodium: 125 mmol/L — ABNORMAL LOW (ref 135–145)
Sodium: 124 mmol/L — ABNORMAL LOW (ref 135–145)
Sodium: 125 mmol/L — ABNORMAL LOW (ref 135–145)
Sodium: 125 mmol/L — ABNORMAL LOW (ref 135–145)
Sodium: 127 mmol/L — ABNORMAL LOW (ref 135–145)

## 2018-08-25 LAB — CBC
HCT: 39.3 % (ref 36.0–46.0)
Hemoglobin: 14.7 g/dL (ref 12.0–15.0)
MCH: 30.6 pg (ref 26.0–34.0)
MCHC: 37.4 g/dL — ABNORMAL HIGH (ref 30.0–36.0)
MCV: 81.7 fL (ref 80.0–100.0)
Platelets: 213 10*3/uL (ref 150–400)
RBC: 4.81 MIL/uL (ref 3.87–5.11)
RDW: 11.7 % (ref 11.5–15.5)
WBC: 6 10*3/uL (ref 4.0–10.5)
nRBC: 0 % (ref 0.0–0.2)

## 2018-08-25 LAB — OSMOLALITY, URINE: Osmolality, Ur: 391 mOsm/kg (ref 300–900)

## 2018-08-25 MED ORDER — COSYNTROPIN 0.25 MG IJ SOLR
0.2500 mg | Freq: Once | INTRAMUSCULAR | Status: AC
  Administered 2018-08-26: 0.25 mg via INTRAVENOUS
  Filled 2018-08-25: qty 0.25

## 2018-08-25 MED ORDER — CALCIUM CARBONATE-VITAMIN D 500-200 MG-UNIT PO TABS: 1.0000 | ORAL_TABLET | Freq: Every day | ORAL | Status: DC

## 2018-08-25 MED ORDER — ROSUVASTATIN CALCIUM 5 MG PO TABS
5.0000 mg | ORAL_TABLET | Freq: Every day | ORAL | Status: DC
  Administered 2018-08-25 – 2018-08-27 (×3): 5 mg via ORAL
  Filled 2018-08-25 (×3): qty 1

## 2018-08-25 MED ORDER — ASPIRIN EC 81 MG PO TBEC
81.0000 mg | DELAYED_RELEASE_TABLET | Freq: Every day | ORAL | Status: DC
  Administered 2018-08-26 – 2018-08-28 (×3): 81 mg via ORAL
  Filled 2018-08-25 (×4): qty 1

## 2018-08-25 MED ORDER — PANTOPRAZOLE SODIUM 40 MG PO TBEC: 40.0000 mg | DELAYED_RELEASE_TABLET | Freq: Every day | ORAL | Status: DC

## 2018-08-25 MED ORDER — SOTALOL HCL 80 MG PO TABS
80.0000 mg | ORAL_TABLET | Freq: Every day | ORAL | Status: DC
  Administered 2018-08-25 – 2018-08-28 (×4): 80 mg via ORAL
  Filled 2018-08-25 (×4): qty 1

## 2018-08-25 MED ORDER — CALCIUM-VITAMIN D 500-200 MG-UNIT PO TABS
ORAL_TABLET | Freq: Two times a day (BID) | ORAL | Status: DC
  Filled 2018-08-25: qty 1

## 2018-08-25 MED ORDER — ONDANSETRON HCL 4 MG PO TABS
4.0000 mg | ORAL_TABLET | Freq: Three times a day (TID) | ORAL | Status: DC | PRN
  Administered 2018-08-25 (×2): 4 mg via ORAL
  Filled 2018-08-25 (×2): qty 1

## 2018-08-25 MED ORDER — RAMELTEON 8 MG PO TABS
8.0000 mg | ORAL_TABLET | Freq: Every day | ORAL | Status: DC
  Administered 2018-08-25 – 2018-08-26 (×2): 8 mg via ORAL
  Filled 2018-08-25 (×3): qty 1

## 2018-08-25 MED ORDER — POTASSIUM CHLORIDE CRYS ER 20 MEQ PO TBCR
40.0000 meq | EXTENDED_RELEASE_TABLET | Freq: Once | ORAL | Status: AC
  Administered 2018-08-26: 01:00:00 40 meq via ORAL
  Filled 2018-08-25: qty 2

## 2018-08-25 MED ORDER — ONDANSETRON HCL 4 MG/2ML IJ SOLN: 4.0000 mg | Freq: Four times a day (QID) | INTRAMUSCULAR | Status: DC | PRN

## 2018-08-25 MED ORDER — SODIUM CHLORIDE 0.9 % IV SOLN
INTRAVENOUS | Status: DC
  Administered 2018-08-25 (×2): via INTRAVENOUS

## 2018-08-25 MED ORDER — CALCIUM CARBONATE-VITAMIN D 500-200 MG-UNIT PO TABS
1.0000 | ORAL_TABLET | Freq: Two times a day (BID) | ORAL | Status: DC
  Administered 2018-08-25 – 2018-08-28 (×7): 1 via ORAL
  Filled 2018-08-25 (×7): qty 1

## 2018-08-25 MED ORDER — PANTOPRAZOLE SODIUM 40 MG PO TBEC
40.0000 mg | DELAYED_RELEASE_TABLET | Freq: Two times a day (BID) | ORAL | Status: DC
  Administered 2018-08-25 – 2018-08-28 (×7): 40 mg via ORAL
  Filled 2018-08-25 (×7): qty 1

## 2018-08-25 NOTE — Progress Notes (Addendum)
Soratol dose held due to BP being 152/57 and QT interval 470. Resident paged. Will continue to monitor.  12:16 - received call from MD stating it's ok to administer medication. Will administer and continue to monitor.  Hiram Comber, RN 08/25/2018 12:18 PM

## 2018-08-25 NOTE — Progress Notes (Addendum)
   Subjective: Awake alert on exam. Reports chronic abdominal pain since prior surgery, has been told this was from "gas" in the past. She also reports nausea and decreased appetite; she has been eating less for the past several days to weeks and has been trying to drink plenty of water because she feels she gets dehydrated . Also reports bad reflux that bothers her on a regular basis. Informed of low sodium and efforts to correct this  Objective:  Vital signs in last 24 hours: Vitals:   08/24/18 2030 08/24/18 2100 08/24/18 2149 08/25/18 0624  BP: (!) 152/56 (!) 147/63 (!) 160/56 (!) 152/52  Pulse: 78 76 81 78  Resp: 14 17    Temp:   98.3 F (36.8 C) 97.9 F (36.6 C)  TempSrc:   Oral Oral  SpO2: 98% 98% 100% 99%   Physical Exam Constitutional:      Appearance: Normal appearance.  Cardiovascular:     Rate and Rhythm: Normal rate and regular rhythm.  Pulmonary:     Effort: Pulmonary effort is normal.     Breath sounds: Normal breath sounds.  Abdominal:     General: Bowel sounds are normal.     Palpations: Abdomen is soft.  Musculoskeletal: Normal range of motion.        General: No tenderness or signs of injury.     Right lower leg: No edema.     Left lower leg: No edema.  Neurological:     Mental Status: She is alert and oriented to person, place, and time.      Assessment/Plan:  Active Problems:   Acute encephalopathy  [name] is a 85 year old female with past medical history significant for thalamic infarct in 2017 and recent diagnosis of adjustment disorder who presents with acute encephalopathy likely secondary to hyponatremia.  Acute encephalopathy improved with IV Fluids in the ED.  Acute encephalopathy Hyponatremia  Patient much better today.  Received 1 L IV fluids.  Sodium 125 today, up from 121.  Urine osmolality 184, osmolality 259, sodium urine 37.  Labs collected after patient given IV normal saline   History and exam points toward hypovolemic  hyponatremia due to low solute diet.Will continue fluids today 75 mL/h with recheck bmp every 4 hours.  Will order labs for adrenal insufficiency.  -Morning cortisol -Urine osmolality -IV NS 75 mL/hr -BMP every 4 hours -PT/OT  VTE Prophx: Lovenox Diet: Heart Healthy ,thins Code: DNR  Dispo: Anticipated discharge in approximately 1-2 day(s).   Tamsen Snider, MD PGY1  [phone]

## 2018-08-25 NOTE — Progress Notes (Signed)
EULINE KIMBLER is a 85 y.o. female patient admitted from ED awake, alert - oriented to self and time- no acute distress noted.  VSS - Blood pressure (!) 160/56, pulse 81, temperature 98.3 F (36.8 C), temperature source Oral, resp. rate 17, SpO2 100 %.    IV in place, occlusive dsg intact without redness.  Orientation to room, and floor completed with information packet given to patient. Admission INP armband ID verified with patient/family, and in place.   SR up x 2, fall assessment complete, with patient and family able to verbalize understanding of risk associated with falls, and verbalized understanding to call nsg before up out of bed.  Call light within reach.  Skin, clean-dry- intact.   No evidence of skin break down noted on exam.   Will cont to eval and treat per MD orders.  Valorie Roosevelt, RN 08/25/2018 1:28 AM

## 2018-08-25 NOTE — Progress Notes (Signed)
Pt completed and passed Yale swallow screening. Pt also c/o 10/10 abd pain. MD paged and made aware. Will continue to monitor.

## 2018-08-25 NOTE — Progress Notes (Signed)
EEG completed, results pending. 

## 2018-08-25 NOTE — Progress Notes (Signed)
Patient up x stand by assistance to bedside commode & placed into chair. Chair alarm on and audible. Will continue to monitor.   Hiram Comber, RN 08/25/2018 4:29 PM

## 2018-08-25 NOTE — Progress Notes (Signed)
During handoff pt reported that she has had difficulty sleeping the past few days. Requested a sleep aid for the night. MD Gilford Rile paged and made aware. Awaiting orders.

## 2018-08-25 NOTE — Progress Notes (Signed)
MRI Brain negative for acute findings, has advanced atrophy and diffuse white matter disease. EEG negative for seizures.  Patient encephalopathy likely secondary to depression /hyponatremia.  Neurology will be available as needed.

## 2018-08-25 NOTE — Progress Notes (Signed)
Internal Medicine Teaching Service Attending:   I saw and examined the patient. I reviewed the resident's note and I agree with the resident's findings and plan as documented in the resident's note.  Principal Problem:   Acute hyponatremia Active Problems:   Acute encephalopathy   Essential hypertension   Adjustment disorder  Hospital day #2 for this 84 year old person admitted with acute encephalopathy due to symptomatic hyponatremia.  Cause of the hyponatremia is multifactorial.  It is improving with free water restriction and gentle IV fluids.  Continue to check serum sodium every 8 hours, check urine osmolality today, PT and OT consults.  We will do a ACTH stim test tomorrow to rule out adrenal insufficiency given the hyponatremia has been a recurring issue.  Lalla Brothers, MD FACP

## 2018-08-25 NOTE — Progress Notes (Signed)
Paged about 10/10 pain in abdomen. VSS. Went to evaluate patient. She was awake and alert on arrival, she states that she was in the hospital to evaluate her abdominal pain. She reports that she has right sided abdominal pain and states that she has had her gall bladder removed and feels like it's related to that. She also endorses nausea, has not had any vomiting. She also reported that she was very dehydrated and that she has not been given any food. We discussed that she seemed very confused when she first got here and wasn't responding very much and that's why we made her NPO. She states that she has been aware of everything that has been going on, and that she just didn't want to respond. She reported that she does not take any pain medications at home.   On exam she is a frail appearing female, NAD, laying comfortably in bed, alert and oriented x3. Cardiac exam showed RRR, no m/r/g. Pulmonary exam was CTABL. Abdomen was soft, normoactive BS, minimal TTP in RLQ area, no guarding or rebound noted.   Plan: Patient appears stable, VSS, and she has not acute abdomen on exam. MRI brain showed no acute findings.  -Place diet order -Order zofran PRN -Give tylenol for pain control, try to avoid opioid given age

## 2018-08-25 NOTE — Progress Notes (Signed)
Patient refusing aspirin this morning due to it making her stomach upset. Resident paged. Will continue to monitor.

## 2018-08-25 NOTE — Procedures (Signed)
Patient Name: [name]  MRN: [id number]  Epilepsy Attending: Lora Havens  Referring Physician/Provider: Dr Remi Haggard Aroor Date: 08/25/18  Duration: 24.28 mins  Patient history: 85yo F with history of lacunar stroke who presented with AMS. EEG ordered to evaluate for seizures.   Level of alertness: awake and drowsy  Technical aspects: This EEG study was done with scalp electrodes positioned according to the 10-20 International system of electrode placement. Electrical activity was reviewed with a high frequency filter of 70Hz  and a low frequency filter of 1Hz . EEG data were recorded continuously and digitally stored.   BACKGROUND ACTIVITY: Posterior dominant rhythm: The posterior dominant rhythm consists of 8-9 Hz activity of moderate voltage (25-35 uV) seen predominantly in posterior head regions, symmetric and reactive to eye opening and eye closing.          SLEEP RECORDINGS:  Only awake and drowsy states were recorded. Drowsiness was characterized by attenuation of the posterior background rhythm.  ACTIVATION PROCEDURES:  Hyperventilation and photic stimulation were not performed.  IMPRESSION: This study is within normal limits. No seizures or epileptiform discharges were seen throughout the recording.   Izzy Courville Barbra Sarks

## 2018-08-26 ENCOUNTER — Other Ambulatory Visit: Payer: Self-pay

## 2018-08-26 LAB — BASIC METABOLIC PANEL
Anion gap: 9 (ref 5–15)
Anion gap: 12 (ref 5–15)
Anion gap: 13 (ref 5–15)
Anion gap: 10 (ref 5–15)
Anion gap: 7 (ref 5–15)
BUN: 10 mg/dL (ref 8–23)
BUN: 7 mg/dL — ABNORMAL LOW (ref 8–23)
BUN: 8 mg/dL (ref 8–23)
BUN: 8 mg/dL (ref 8–23)
BUN: 8 mg/dL (ref 8–23)
CO2: 24 mmol/L (ref 22–32)
CO2: 23 mmol/L (ref 22–32)
CO2: 21 mmol/L — ABNORMAL LOW (ref 22–32)
CO2: 23 mmol/L (ref 22–32)
CO2: 24 mmol/L (ref 22–32)
Calcium: 8.6 mg/dL — ABNORMAL LOW (ref 8.9–10.3)
Calcium: 9 mg/dL (ref 8.9–10.3)
Calcium: 8.8 mg/dL — ABNORMAL LOW (ref 8.9–10.3)
Calcium: 8.9 mg/dL (ref 8.9–10.3)
Calcium: 8.6 mg/dL — ABNORMAL LOW (ref 8.9–10.3)
Chloride: 96 mmol/L — ABNORMAL LOW (ref 98–111)
Chloride: 93 mmol/L — ABNORMAL LOW (ref 98–111)
Chloride: 91 mmol/L — ABNORMAL LOW (ref 98–111)
Chloride: 92 mmol/L — ABNORMAL LOW (ref 98–111)
Chloride: 94 mmol/L — ABNORMAL LOW (ref 98–111)
Creatinine, Ser: 0.58 mg/dL (ref 0.44–1.00)
Creatinine, Ser: 0.51 mg/dL (ref 0.44–1.00)
Creatinine, Ser: 0.73 mg/dL (ref 0.44–1.00)
Creatinine, Ser: 0.59 mg/dL (ref 0.44–1.00)
Creatinine, Ser: 0.72 mg/dL (ref 0.44–1.00)
GFR calc Af Amer: >60 mL/min (ref >60)
GFR calc Af Amer: >60 mL/min (ref >60)
GFR calc Af Amer: >60 mL/min (ref >60)
GFR calc Af Amer: >60 mL/min (ref >60)
GFR calc Af Amer: >60 mL/min (ref >60)
GFR calc non Af Amer: >60 mL/min (ref >60)
GFR calc non Af Amer: >60 mL/min (ref >60)
GFR calc non Af Amer: >60 mL/min (ref >60)
GFR calc non Af Amer: >60 mL/min (ref >60)
GFR calc non Af Amer: >60 mL/min (ref >60)
Glucose, Bld: 108 mg/dL — ABNORMAL HIGH (ref 70–99)
Glucose, Bld: 106 mg/dL — ABNORMAL HIGH (ref 70–99)
Glucose, Bld: 215 mg/dL — ABNORMAL HIGH (ref 70–99)
Glucose, Bld: 143 mg/dL — ABNORMAL HIGH (ref 70–99)
Glucose, Bld: 164 mg/dL — ABNORMAL HIGH (ref 70–99)
Potassium: 3.5 mmol/L (ref 3.5–5.1)
Potassium: 3.9 mmol/L (ref 3.5–5.1)
Potassium: 3.9 mmol/L (ref 3.5–5.1)
Potassium: 4.1 mmol/L (ref 3.5–5.1)
Potassium: 3.9 mmol/L (ref 3.5–5.1)
Sodium: 129 mmol/L — ABNORMAL LOW (ref 135–145)
Sodium: 128 mmol/L — ABNORMAL LOW (ref 135–145)
Sodium: 125 mmol/L — ABNORMAL LOW (ref 135–145)
Sodium: 125 mmol/L — ABNORMAL LOW (ref 135–145)
Sodium: 125 mmol/L — ABNORMAL LOW (ref 135–145)

## 2018-08-26 LAB — ACTH STIMULATION, 3 TIME POINTS
Cortisol, 30 Min: 37.9 ug/dL
Cortisol, 60 Min: 47.3 ug/dL
Cortisol, Base: 24.5 ug/dL

## 2018-08-26 MED ORDER — SODIUM CHLORIDE 0.9 % IV SOLN
INTRAVENOUS | Status: DC
  Administered 2018-08-26 – 2018-08-28 (×5): via INTRAVENOUS

## 2018-08-26 NOTE — Evaluation (Signed)
Physical Therapy Evaluation Patient Details Name: [name] MRN: [id number] DOB: 20-Mar-1933 Today's Date: 08/26/2018   History of Present Illness  85 y.o. female admitted on 08/24/18 for AMS.  Pt dx with acute metabolic encepholopathy hyponatremia.  Pt with significant PMH of thalamic infarct, HTN, arthritis, bil THA, closed reduction of R THA (dislocation in 2018).    Clinical Impression  Pt is likely close to her functional baseline of using RW for gait at home.  She had no gross LOB during my assessment and safely ambulated with the use of RW.  She was alert and oriented without signs of AMS.  She has a supportive daughter who checks on her regularly.   PT to follow acutely for deficits listed below.      Follow Up Recommendations No PT follow up    Equipment Recommendations  None recommended by PT    Recommendations for Other Services   NA    Precautions / Restrictions   None     Mobility  Bed Mobility Overal bed mobility: Modified Independent                Transfers Overall transfer level: Needs assistance   Transfers: Sit to/from Stand Sit to Stand: Supervision         General transfer comment: supervision for safety when coming off of low toilet.  Ambulation/Gait Ambulation/Gait assistance: Supervision Gait Distance (Feet): 15 Feet Assistive device: Rolling walker (2 wheeled) Gait Pattern/deviations: Step-through pattern;Shuffle;Trunk flexed     General Gait Details: Pt with safe RW use, shuffling pattern, but likely close to baseline.  No gross balance checks or instability even with hands off of RW.          Balance Overall balance assessment: Needs assistance Sitting-balance support: Feet supported;No upper extremity supported Sitting balance-Leahy Scale: Good Sitting balance - Comments: can reach down and pull up socks seated EOB.    Standing balance support: No upper extremity supported;Single extremity supported Standing balance-Leahy  Scale: Good Standing balance comment: manages underwear without hands on RW without significant LOB.                              Pertinent Vitals/Pain Pain Assessment: No/denies pain    Home Living Family/patient expects to be discharged to:: Private residence Living Arrangements: Alone Available Help at Discharge: Family;Available PRN/intermittently(daughter lives 15-20 mins away, checks in frequently) Type of Home: House Home Access: Stairs to enter Entrance Stairs-Rails: Right Entrance Stairs-Number of Steps: 3 Home Layout: One level Home Equipment: Walker - 2 wheels;Cane - single point      Prior Function Level of Independence: Needs assistance   Gait / Transfers Assistance Needed: walks with RW mostly, but also has cane she uses at times  ADL's / Homemaking Assistance Needed: sponge bathes, cooks, drives        Hand Dominance   Dominant Hand: Right    Extremity/Trunk Assessment   Upper Extremity Assessment Upper Extremity Assessment: Defer to OT evaluation    Lower Extremity Assessment Lower Extremity Assessment: Generalized weakness(h/o bil THA with h/o dislocation)    Cervical / Trunk Assessment Cervical / Trunk Assessment: Kyphotic  Communication   Communication: No difficulties  Cognition Arousal/Alertness: Awake/alert Behavior During Therapy: WFL for tasks assessed/performed Overall Cognitive Status: Within Functional Limits for tasks assessed  General Comments: A and O x 4, conversation normal, processing speed normal, follows commands well.             Assessment/Plan    PT Assessment Patient needs continued PT services  PT Problem List Decreased strength;Decreased activity tolerance;Decreased mobility;Decreased balance;Decreased knowledge of use of DME       PT Treatment Interventions DME instruction;Gait training;Stair training;Functional mobility training;Therapeutic  exercise;Therapeutic activities;Balance training;Cognitive remediation;Patient/family education;Neuromuscular re-education    PT Goals (Current goals can be found in the Care Plan section)  Acute Rehab PT Goals Patient Stated Goal: to get back home PT Goal Formulation: With patient Time For Goal Achievement: 09/09/18 Potential to Achieve Goals: Good    Frequency Min 3X/week        Co-evaluation PT/OT/SLP Co-Evaluation/Treatment: Yes Reason for Co-Treatment: Other (comment)(overlapped) PT goals addressed during session: Mobility/safety with mobility;Balance;Proper use of DME         AM-PAC PT "6 Clicks" Mobility  Outcome Measure Help needed turning from your back to your side while in a flat bed without using bedrails?: None Help needed moving from lying on your back to sitting on the side of a flat bed without using bedrails?: None Help needed moving to and from a bed to a chair (including a wheelchair)?: None Help needed standing up from a chair using your arms (e.g., wheelchair or bedside chair)?: None Help needed to walk in hospital room?: None Help needed climbing 3-5 steps with a railing? : A Little 6 Click Score: 23    End of Session Equipment Utilized During Treatment: Gait belt Activity Tolerance: Patient tolerated treatment well Patient left: Other (comment)(standing at sink preparing for bath with OT) Nurse Communication: Mobility status PT Visit Diagnosis: Muscle weakness (generalized) (M62.81);Difficulty in walking, not elsewhere classified (R26.2)    Time: 2130-86571412-1432 PT Time Calculation (min) (ACUTE ONLY): 20 min   Charges:      Lurena Joinerebecca B. Katilyn Miltenberger, PT, DPT  Acute Rehabilitation [phone] pager [phone] office  @ [facility] [place]: [phone]   PT Evaluation $PT Eval Moderate Complexity: 1 Mod          08/26/2018, 2:43 PM

## 2018-08-26 NOTE — Evaluation (Signed)
Occupational Therapy Evaluation Patient Details Name: [name] MRN: [id number] DOB: 11/19/1933 Today's Date: 08/26/2018    History of Present Illness 85 y.o. female admitted on 08/24/18 for AMS.  Pt dx with acute metabolic encepholopathy hyponatremia.  Pt with significant PMH of thalamic infarct, HTN, arthritis, bil THA, closed reduction of R THA (dislocation in 2018).     Clinical Impression   Pt admitted with above diagnoses, presents to OT for evaluation following acute metabolic encephalopathy. PTA pt reports hx with therapy following her stroke, but as of late has been living ind with dtr checking on her periodically. Pt states she was completing IADL and BADL independently, driving, etc. At time of evaluation pt is supervision for dynamic BADL. She stood at the sink to brush her teeth, wash her face and upper body/seated to wash BLEs with set up A. Pt also completed toilet transfer with min guard/supervision for low toilet. During evaluation, pt very tangential and telling disjointed stories with no reference for OT to follow- unsure of baseline. She would easily get distracted in a task and need cueing for next step. Will continue to follow while acute, but no follow up OT indicated at this time.     Follow Up Recommendations  No OT follow up;Supervision - Intermittent    Equipment Recommendations  None recommended by OT    Recommendations for Other Services       Precautions / Restrictions Precautions Precautions: Fall Restrictions Weight Bearing Restrictions: No      Mobility Bed Mobility Overal bed mobility: Modified Independent                Transfers Overall transfer [name]: Needs assistance   Transfers: Sit to/from Stand Sit to Stand: Supervision         General transfer comment: supervision for safety    Balance Overall balance assessment: Needs assistance Sitting-balance support: Feet supported;No upper extremity supported Sitting balance-Leahy  Scale: Good Sitting balance - Comments: can reach down and pull up socks seated EOB without LOB   Standing balance support: No upper extremity supported;Single extremity supported Standing balance-Leahy Scale: Good Standing balance comment: able to pull up panties without LOB, hangs onto walker for balance at times                           ADL either performed or assessed with clinical judgement   ADL Overall ADL's : Needs assistance/impaired Eating/Feeding: Sitting   Grooming: Oral care;Wash/dry face;Standing Grooming Details (indicate cue type and reason): standing at sink to wash face and brush teeth Upper Body Bathing: Set up;Standing Upper Body Bathing Details (indicate cue type and reason): at sink to wash underarms and torso Lower Body Bathing: Set up;Sit to/from stand;Sitting/lateral leans Lower Body Bathing Details (indicate cue type and reason): sitting in chair to wash B legs and feet Upper Body Dressing : Set up;Sitting;Standing Upper Body Dressing Details (indicate cue type and reason): don gown Lower Body Dressing: Set up;Sit to/from stand;Sitting/lateral leans Lower Body Dressing Details (indicate cue type and reason): to don socks Toilet Transfer: Min guard;Regular Toilet;RW   Toileting- Clothing Manipulation and Hygiene: Modified independent     Tub/Shower Transfer Details (indicate cue type and reason): pt sponge bathes at baseline, because has a tub and does not feel safe stepping in alone Functional mobility during ADLs: Supervision/safety General ADL Comments: pt overall supervision [name] for BADL at this tine     Vision Baseline Vision/History: Wears glasses Wears  Glasses: At all times Additional Comments: glasses not in room     Perception     Praxis      Pertinent Vitals/Pain Pain Assessment: No/denies pain     Hand Dominance Right   Extremity/Trunk Assessment Upper Extremity Assessment Upper Extremity Assessment: Generalized  weakness   Lower Extremity Assessment Lower Extremity Assessment: Defer to PT evaluation   Cervical / Trunk Assessment Cervical / Trunk Assessment: Kyphotic   Communication Communication Communication: No difficulties   Cognition Arousal/Alertness: Awake/alert Behavior During Therapy: WFL for tasks assessed/performed Overall Cognitive Status: No family/caregiver present to determine baseline cognitive functioning                                 General Comments: pt A&O x4 and following commands, but very tangential and distractible. Pt telling many stories back to back that do not align, and without reference for OT to follow along. Pt often stopping during tasks and stating "wait what am I doing", difficult to redirect from tangents   General Comments       Exercises     Shoulder Instructions      Home Living Family/patient expects to be discharged to:: Private residence Living Arrangements: Alone Available Help at Discharge: Family;Available PRN/intermittently;Other (Comment)(dtr lives close, checks on pt) Type of Home: House Home Access: Stairs to enter Enter[employer] CorporationEntrance Stairs-Number of Steps: 3 Entrance Stairs-Rails: Right Home Layout: One [name]     Bathroom Shower/Tub: Other (comment)(pt sponge bathes)   Bathroom Toilet: Standard     Home Equipment: Walker - 2 wheels;Cane - single point          Prior Functioning/Environment [name] of Independence: Needs assistance  Gait / Transfers Assistance Needed: walks with RW mostly, but also has cane she uses at times ADL's / Homemaking Assistance Needed: sponge bathes, cooks, drives Communication / Swallowing Assistance Needed: none          OT Problem List: Decreased strength;Decreased knowledge of use of DME or AE;Decreased activity tolerance;Impaired balance (sitting and/or standing)      OT Treatment/Interventions:      OT Goals(Current goals can be found in the care plan section) Acute Rehab OT  Goals Patient Stated Goal: to go home and be ind as possible OT Goal Formulation: With patient Time For Goal Achievement: 09/09/18 Potential to Achieve Goals: Good  OT Frequency:     Barriers to D/C:            Co-evaluation PT/OT/SLP Co-Evaluation/Treatment: Yes Reason for Co-Treatment: Other (comment)(overlapped) PT goals addressed during session: Mobility/safety with mobility;Proper use of DME OT goals addressed during session: ADL's and self-care      AM-PAC OT "6 Clicks" Daily Activity     Outcome Measure Help from another person eating meals?: None Help from another person taking care of personal grooming?: None Help from another person toileting, which includes using toliet, bedpan, or urinal?: None Help from another person bathing (including washing, rinsing, drying)?: A Little Help from another person to put on and taking off regular upper body clothing?: None Help from another person to put on and taking off regular lower body clothing?: None 6 Click Score: 23   End of Session Equipment Utilized During Treatment: Gait belt;Rolling walker Nurse Communication: Mobility status;Other (comment)(bath completed)  Activity Tolerance: Patient tolerated treatment well Patient left: in bed;with call bell/phone within reach;with bed alarm set  OT Visit Diagnosis: Unsteadiness on feet (R26.81);Other abnormalities of gait and  mobility (R26.89);Muscle weakness (generalized) (M62.81)                Time: 2297-9892 OT Time Calculation (min): 45 min Charges:  OT General Charges $OT Visit: 1 Visit OT Evaluation $OT Eval Low Complexity: 1 Low OT Treatments $Self Care/Home Management : 8-22 mins  Zenovia Jarred, MSOT, OTR/L Behavioral Health OT/ Acute Relief OT [facility] Office: [phone]   Zenovia Jarred 08/26/2018, 3:10 PM

## 2018-08-26 NOTE — Progress Notes (Signed)
  Date: 08/26/2018  Patient name: [name]  Medical record number: [id number]  Date of birth: 01-02-1934   I have seen and evaluated this patient and I have discussed the plan of care with the house staff. Please see their note for complete details. I concur with their findings with the following additions/corrections:   Hyponatremia slowly improving with fluid restriction and IV saline, but this afternoon back down to 125. Will need additional IV saline and fluid restriction, continue regular BMP. Hopefully her mental status will continue to improve with correction of her sodium.  Lenice Pressman, M.D., Ph.D. 08/26/2018, 4:00 PM

## 2018-08-26 NOTE — Progress Notes (Signed)
   Subjective: Pt seen at the bedside on AM rounds today. Appears comfortable lying in bed. We informed the pt that her sodium levels are improved, but still needs some more today because her levels are still a little low. Currently Alert and Oriented x3  this AM. Pt says she thinks she has another test to undertake today, however we cannot find any upcoming tests. Says there are some nights that she doesn't go to sleep for the past few months. She went to sleep well with sleep aid last night, but was woken up a few times for medications /labs.     Objective:  Vital signs in last 24 hours: Vitals:   08/25/18 1118 08/25/18 1552 08/25/18 2244 08/26/18 0645  BP: (!) 152/57 131/64 (!) 122/55 (!) 148/67  Pulse: 80 64 76 79  Resp: 16 18  14   Temp:  98 F (36.7 C) 98 F (36.7 C) 98.3 F (36.8 C)  TempSrc:  Oral Oral Oral  SpO2:  95% 99% 99%   Physical Exam Constitutional:      General: She is not in acute distress.    Appearance: Normal appearance.  HENT:     Mouth/Throat:     Mouth: Mucous membranes are moist.  Eyes:     General: No scleral icterus. Cardiovascular:     Rate and Rhythm: Normal rate and regular rhythm.  Pulmonary:     Effort: Pulmonary effort is normal.     Breath sounds: Normal breath sounds.  Abdominal:     General: Bowel sounds are normal.     Palpations: Abdomen is soft.  Skin:    General: Skin is warm and dry.  Neurological:     Mental Status: She is alert and oriented to person, place, and time.     Assessment/Plan:  Principal Problem:   Acute hyponatremia Active Problems:   Acute encephalopathy   Essential hypertension   Adjustment disorder  In summary, [name] is a 85 year old female with past medical history significant for thalamic infarct in 2017 and recent diagnosis of adjustment disorder who presents with acute encephalopathy likely secondary to hyponatremia. Acute encephalopathy improved with IV Fluids.  Acute encephalopathy  Hyponatremia  Patient reports feeling well today. Received ~2 L IV fluids.  Sodium 128 today.  History and exam points toward hypovolemic hyponatremia due to low solute diet.  Morning cortisol and ACTH Stim test for adrenal insufficiency normal. Will continue fluids today 75 mL/h with recheck bmp every 4 hours.  - Goal < 134 @ 1 pm 7/27 -IV NS 75 mL/hr -BMP every 4 hours -PT/OT  VTE Prophx: Lovenox Diet: Heart Healthy ,thins Code: DNR  Dispo: Anticipated discharge in approximately 1-2 day(s).   Tamsen Snider, MD PGY1  [phone]

## 2018-08-27 LAB — BASIC METABOLIC PANEL
Anion gap: 8 (ref 5–15)
Anion gap: 11 (ref 5–15)
Anion gap: 9 (ref 5–15)
Anion gap: 9 (ref 5–15)
Anion gap: 7 (ref 5–15)
BUN: 8 mg/dL (ref 8–23)
BUN: 6 mg/dL — ABNORMAL LOW (ref 8–23)
BUN: 7 mg/dL — ABNORMAL LOW (ref 8–23)
BUN: 10 mg/dL (ref 8–23)
BUN: 10 mg/dL (ref 8–23)
CO2: 26 mmol/L (ref 22–32)
CO2: 23 mmol/L (ref 22–32)
CO2: 27 mmol/L (ref 22–32)
CO2: 24 mmol/L (ref 22–32)
CO2: 24 mmol/L (ref 22–32)
Calcium: 8.8 mg/dL — ABNORMAL LOW (ref 8.9–10.3)
Calcium: 8.6 mg/dL — ABNORMAL LOW (ref 8.9–10.3)
Calcium: 8.7 mg/dL — ABNORMAL LOW (ref 8.9–10.3)
Calcium: 8.7 mg/dL — ABNORMAL LOW (ref 8.9–10.3)
Calcium: 8.7 mg/dL — ABNORMAL LOW (ref 8.9–10.3)
Chloride: 97 mmol/L — ABNORMAL LOW (ref 98–111)
Chloride: 97 mmol/L — ABNORMAL LOW (ref 98–111)
Chloride: 95 mmol/L — ABNORMAL LOW (ref 98–111)
Chloride: 94 mmol/L — ABNORMAL LOW (ref 98–111)
Chloride: 98 mmol/L (ref 98–111)
Creatinine, Ser: 0.59 mg/dL (ref 0.44–1.00)
Creatinine, Ser: 0.51 mg/dL (ref 0.44–1.00)
Creatinine, Ser: 0.58 mg/dL (ref 0.44–1.00)
Creatinine, Ser: 0.61 mg/dL (ref 0.44–1.00)
Creatinine, Ser: 0.53 mg/dL (ref 0.44–1.00)
GFR calc Af Amer: >60 mL/min (ref >60)
GFR calc Af Amer: >60 mL/min (ref >60)
GFR calc Af Amer: >60 mL/min (ref >60)
GFR calc Af Amer: >60 mL/min (ref >60)
GFR calc Af Amer: >60 mL/min (ref >60)
GFR calc non Af Amer: >60 mL/min (ref >60)
GFR calc non Af Amer: >60 mL/min (ref >60)
GFR calc non Af Amer: >60 mL/min (ref >60)
GFR calc non Af Amer: >60 mL/min (ref >60)
GFR calc non Af Amer: >60 mL/min (ref >60)
Glucose, Bld: 115 mg/dL — ABNORMAL HIGH (ref 70–99)
Glucose, Bld: 104 mg/dL — ABNORMAL HIGH (ref 70–99)
Glucose, Bld: 103 mg/dL — ABNORMAL HIGH (ref 70–99)
Glucose, Bld: 150 mg/dL — ABNORMAL HIGH (ref 70–99)
Glucose, Bld: 105 mg/dL — ABNORMAL HIGH (ref 70–99)
Potassium: 3.9 mmol/L (ref 3.5–5.1)
Potassium: 3.3 mmol/L — ABNORMAL LOW (ref 3.5–5.1)
Potassium: 3.9 mmol/L (ref 3.5–5.1)
Potassium: 3.8 mmol/L (ref 3.5–5.1)
Potassium: 4.2 mmol/L (ref 3.5–5.1)
Sodium: 131 mmol/L — ABNORMAL LOW (ref 135–145)
Sodium: 131 mmol/L — ABNORMAL LOW (ref 135–145)
Sodium: 131 mmol/L — ABNORMAL LOW (ref 135–145)
Sodium: 127 mmol/L — ABNORMAL LOW (ref 135–145)
Sodium: 129 mmol/L — ABNORMAL LOW (ref 135–145)

## 2018-08-27 MED ORDER — RAMELTEON 8 MG PO TABS
8.0000 mg | ORAL_TABLET | Freq: Every evening | ORAL | Status: DC | PRN
  Administered 2018-08-27: 8 mg via ORAL
  Filled 2018-08-27 (×2): qty 1

## 2018-08-27 MED ORDER — POTASSIUM CHLORIDE CRYS ER 20 MEQ PO TBCR
40.0000 meq | EXTENDED_RELEASE_TABLET | Freq: Once | ORAL | Status: AC
  Administered 2018-08-27: 40 meq via ORAL
  Filled 2018-08-27: qty 2

## 2018-08-27 NOTE — Care Management Important Message (Signed)
Important Message  Patient Details  Name: VIDHI DELELLIS MRN: [id number] Date of Birth: 09-24-1933   Medicare Important Message Given:  Yes     Antanette Richwine 08/27/2018, 1:03 PM

## 2018-08-27 NOTE — Progress Notes (Signed)
  Date: 08/27/2018  Patient name: [name]  Medical record number: [id number]  Date of birth: Jun 29, 1933   I have seen and evaluated this patient and I have discussed the plan of care with the house staff. Please see their note for complete details. I concur with their findings with the following additions/corrections:   Hyponatremia slowly improving with combination of fluid restriction and IV saline. Mental status seems back to baseline, now even having recollection of initial presentation when she was unresponsive. Once hyponatremia back to normal, will discharge home with close follow up as she is at high risk of recurrence and I am concerned about her overall functional status.   Lenice Pressman, M.D., Ph.D. 08/27/2018, 5:07 PM

## 2018-08-27 NOTE — Progress Notes (Signed)
Physical Therapy Treatment Patient Details Name: [name] MRN: [id number] DOB: Nov 09, 1933 Today's Date: 08/27/2018    History of Present Illness 85 y.o. female admitted on 08/24/18 for AMS.  Pt dx with acute metabolic encepholopathy hyponatremia.  Pt with significant PMH of thalamic infarct, HTN, arthritis, bil THA, closed reduction of R THA (dislocation in 2018).      PT Comments    Pt was seen for mobility and strength training, using RW and with supervised help on the hall.  Pt is distracted with many thoughts during therapy, and note her struggle to stay on task with PT talking about safety and mobility on the hall.  Follow up acutely for work on her goals to focus on stair training and strengthening for reducing fall risk at home.   Follow Up Recommendations  No PT follow up     Equipment Recommendations  None recommended by PT    Recommendations for Other Services       Precautions / Restrictions Precautions Precautions: Fall Restrictions Weight Bearing Restrictions: No    Mobility  Bed Mobility Overal bed mobility: Modified Independent                Transfers Overall transfer level: Needs assistance Equipment used: Rolling walker (2 wheeled) Transfers: Sit to/from Stand Sit to Stand: Supervision         General transfer comment: due to her attention to the task  Ambulation/Gait Ambulation/Gait assistance: Supervision;Min guard Gait Distance (Feet): 120 Feet Assistive device: Rolling walker (2 wheeled) Gait Pattern/deviations: Step-through pattern;Wide base of support Gait velocity: reduced Gait velocity interpretation: <1.31 ft/sec, indicative of household ambulator General Gait Details: pt is close to obstacles but not outright hitting them   Stairs             Wheelchair Mobility    Modified Rankin (Stroke Patients Only)       Balance Overall balance assessment: Needs assistance Sitting-balance support: Feet supported Sitting  balance-Leahy Scale: Good Sitting balance - Comments: reaching to socks to adjust them   Standing balance support: Single extremity supported Standing balance-Leahy Scale: Good                              Cognition Arousal/Alertness: Awake/alert Behavior During Therapy: WFL for tasks assessed/performed Overall Cognitive Status: No family/caregiver present to determine baseline cognitive functioning                                 General Comments: has some less aware moments where she is having some disconnected conversation      Exercises General Exercises - Lower Extremity Long Arc Quad: Strengthening;Both;10 reps Straight Leg Raises: Strengthening;Both;10 reps    General Comments General comments (skin integrity, edema, etc.): pt was out on the hall where she was min guard vs supervision in her room.  Mainly is min guard due to the number of obstacles in the hallway that are in her way and to help her navigate them      Pertinent Vitals/Pain      Home Living                      Prior Function            PT Goals (current goals can now be found in the care plan section) Acute Rehab PT Goals Patient Stated Goal: get  home and feel better Progress towards PT goals: Progressing toward goals    Frequency    Min 3X/week      PT Plan Current plan remains appropriate    Co-evaluation              AM-PAC PT "6 Clicks" Mobility   Outcome Measure  Help needed turning from your back to your side while in a flat bed without using bedrails?: None Help needed moving from lying on your back to sitting on the side of a flat bed without using bedrails?: None Help needed moving to and from a bed to a chair (including a wheelchair)?: None Help needed standing up from a chair using your arms (e.g., wheelchair or bedside chair)?: A Little Help needed to walk in hospital room?: A Little Help needed climbing 3-5 steps with a railing? : A  Lot 6 Click Score: 20    End of Session Equipment Utilized During Treatment: Gait belt Activity Tolerance: Patient tolerated treatment well Patient left: in chair;with call bell/phone within reach;with chair alarm set Nurse Communication: Mobility status PT Visit Diagnosis: Muscle weakness (generalized) (M62.81);Difficulty in walking, not elsewhere classified (R26.2)     Time: [id number] PT Time Calculation (min) (ACUTE ONLY): 33 min  Charges:  $Gait Training: 8-22 mins $Therapeutic Exercise: 8-22 mins                   Ivar DrapeRuth E Sugar Vanzandt 08/27/2018, 1:08 PM   Samul Dadauth Danitza Schoenfeldt, PT MS Acute Rehab Dept. Number: [facility] [phone] and [facility] [phone]

## 2018-08-27 NOTE — Progress Notes (Addendum)
   Subjective:   The patient was seen sitting up in her bed this morning. She was full of energy and was able to engage in conversation with provider. The patient stated that she recalls not being able to speak when she first came into the hospital.   Spoke to patient about following up with GI physician RUQ ultrasound to look at liver.  Objective:  Vital signs in last 24 hours: Vitals:   08/26/18 1522 08/26/18 2018 08/26/18 2217 08/27/18 0457  BP: (!) 128/46 (!) 141/52  (!) 132/54  Pulse: 64 69  77  Resp: 18     Temp: 97.8 F (36.6 C) 98.2 F (36.8 C)  98.2 F (36.8 C)  TempSrc: Oral Oral  Oral  SpO2: 99% 98%  97%  Weight:   43.6 kg   Height:   5' (1.524 m)    Physical Exam Constitutional:      General: She is not in acute distress. Cardiovascular:     Rate and Rhythm: Normal rate and regular rhythm.  Abdominal:     General: Bowel sounds are normal.     Palpations: Abdomen is soft.  Musculoskeletal:     Right lower leg: No edema.     Left lower leg: No edema.  Neurological:     Mental Status: She is alert. Mental status is at baseline.  Psychiatric:        Mood and Affect: Mood normal.     Assessment/Plan:  Principal Problem:   Acute hyponatremia Active Problems:   Acute encephalopathy   Essential hypertension   Adjustment disorder  In summary, Domino Holten is a 85 year old female with past medical history significant for thalamic infarct in 2017 and recent diagnosis of adjustment disorder who presents with acute encephalopathy likely secondary to hyponatremia. Acute encephalopathy improved with IV Fluids.  Acute encephalopathy Resolved, thought to be secondary to hyponatremia.   Hyponatremia  Patient with Na of 131 this am. Goal na for today is < 137 @ 1pm Patient is currently on NS 136ml/hr Q8 BMP if not corrected at 4pm PT and OT did not recommend any need for follow up   VTE Prophx: Lovenox Diet: Heart Healthy ,thins Code: DNR  Dispo:  Anticipated discharge in approximately 0-1 day(s).  Tamsen Snider, MD PGY1  [phone]

## 2018-08-28 DIAGNOSIS — Z888 Allergy status to other drugs, medicaments and biological substances status: Secondary | ICD-10-CM

## 2018-08-28 DIAGNOSIS — Z885 Allergy status to narcotic agent status: Secondary | ICD-10-CM

## 2018-08-28 DIAGNOSIS — Z881 Allergy status to other antibiotic agents status: Secondary | ICD-10-CM

## 2018-08-28 LAB — BASIC METABOLIC PANEL
Anion gap: 9 (ref 5–15)
Anion gap: 7 (ref 5–15)
BUN: 7 mg/dL — ABNORMAL LOW (ref 8–23)
BUN: 7 mg/dL — ABNORMAL LOW (ref 8–23)
CO2: 24 mmol/L (ref 22–32)
CO2: 24 mmol/L (ref 22–32)
Calcium: 8.7 mg/dL — ABNORMAL LOW (ref 8.9–10.3)
Calcium: 9.1 mg/dL (ref 8.9–10.3)
Chloride: 100 mmol/L (ref 98–111)
Chloride: 101 mmol/L (ref 98–111)
Creatinine, Ser: 0.54 mg/dL (ref 0.44–1.00)
Creatinine, Ser: 0.59 mg/dL (ref 0.44–1.00)
GFR calc Af Amer: >60 mL/min (ref >60)
GFR calc Af Amer: >60 mL/min (ref >60)
GFR calc non Af Amer: >60 mL/min (ref >60)
GFR calc non Af Amer: >60 mL/min (ref >60)
Glucose, Bld: 105 mg/dL — ABNORMAL HIGH (ref 70–99)
Glucose, Bld: 90 mg/dL (ref 70–99)
Potassium: 3.7 mmol/L (ref 3.5–5.1)
Potassium: 4.2 mmol/L (ref 3.5–5.1)
Sodium: 133 mmol/L — ABNORMAL LOW (ref 135–145)
Sodium: 132 mmol/L — ABNORMAL LOW (ref 135–145)

## 2018-08-28 MED ORDER — MAGNESIUM CITRATE PO SOLN
0.5000 | Freq: Once | ORAL | Status: AC
  Administered 2018-08-28: 0.5 via ORAL
  Filled 2018-08-28: qty 296

## 2018-08-28 MED ORDER — POTASSIUM CHLORIDE CRYS ER 20 MEQ PO TBCR
40.0000 meq | EXTENDED_RELEASE_TABLET | Freq: Once | ORAL | Status: AC
  Administered 2018-08-28: 07:00:00 40 meq via ORAL
  Filled 2018-08-28: qty 2

## 2018-08-28 NOTE — Plan of Care (Signed)
  Problem: Clinical Measurements: Goal: Diagnostic test results will improve Outcome: Progressing   Problem: Activity: Goal: Risk for activity intolerance will decrease Outcome: Progressing   Problem: Coping: Goal: Level of anxiety will decrease Outcome: Progressing   Problem: Safety: Goal: Ability to remain free from injury will improve Outcome: Progressing   

## 2018-08-28 NOTE — Progress Notes (Signed)
1325: Patient discharged home with personal belongings.

## 2018-08-29 NOTE — Discharge Summary (Addendum)
Name: [name] MRN: [id number] DOB: 09/03/1933 85 y.o. PCP: Heywood Bene, PA-C  Date of Admission: 08/24/2018  1:14 PM Date of Discharge: 08/28/2018 Attending Physician: Oda Kilts Discharge Diagnosis: 1. Hyponatremia  Discharge Medications: Allergies as of 08/28/2018       Reactions   Glucosamine Sulfate Other (See Comments)   unknown   Amoxicillin Rash   Ativan [lorazepam] Other (See Comments)   altered   Niacin Nausea And Vomiting   Phenergan [promethazine] Other (See Comments)   "Seeing things"          Medication List     STOP taking these medications    pantoprazole 40 MG tablet Commonly known as: PROTONIX       TAKE these medications    amLODipine 10 MG tablet Commonly known as: NORVASC Take 1 tablet (10 mg total) by mouth daily.   aspirin EC 81 MG tablet Take 1 tablet (81 mg total) by mouth daily.   CALCIUM 600+D3 PO Take 1 tablet by mouth 2 (two) times daily.   FISH OIL PO Take 1 tablet by mouth daily.   latanoprost 0.005 % ophthalmic solution Commonly known as: XALATAN Place 1 drop into both eyes at bedtime.   lisinopril 20 MG tablet Commonly known as: ZESTRIL Take 1 tablet (20 mg total) by mouth daily.   Multi-Vitamins Tabs Take 1 tablet by mouth daily.   omeprazole 40 MG capsule Commonly known as: PRILOSEC Take 40 mg by mouth 2 (two) times a day.   ondansetron 4 MG tablet Commonly known as: ZOFRAN Take 4 mg by mouth every 8 (eight) hours as needed for nausea or vomiting.   rosuvastatin 5 MG tablet Commonly known as: CRESTOR Take 1 tablet (5 mg total) by mouth daily at 6 PM.   sotalol 80 MG tablet Commonly known as: BETAPACE Take 1 tablet (80 mg total) by mouth daily.        Disposition and follow-up:   [name] was discharged from [facility] in Stable condition.  At the hospital follow up visit please address:  1.  Hyponatremia: Unclear etiology , likely multifactorial.  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        - Check BMP  Chronic Abdominal Pain  - Patient has chronic RUQ pain since gallbladder surgery Per records current workup and referral to GI doctor under way.No workup in the hospital.  2.  Labs / imaging needed at time of follow-up: BMP   3.  Pending labs/ test needing follow-up:   Follow-up Appointments:    Hospital Course by problem list: 1. Hyponatremia Acute encephalopathy Hyponatremia  Patient appeared to be in a catatonic state on initial presentation. Sodium noted to be 121. MRI , EEG and lab work unremarkable otherwise. Patient became alert after 1 L NS. Likely multifactorial etiology. Patient's story consistent with low solute diet, but labs suggested component of SIADH. Labs collected after NS was started.  Continued to correct sodium over next few days with IV fluids and water restriction. Adrenal function testing normal.  Patient slightly hyponatremic on discharge day, but anxious to go with close follow up. Mental status improved and worked well with PT/OT.   Discharge Vitals:   BP (!) 142/53   Pulse 67   Temp 98.4 F (36.9 C) (Oral)   Resp 16   Ht 5' (1.524 m)   Wt 43.6 kg   SpO2 100%   BMI 18.79 kg/m   Pertinent Labs,  Studies, and Procedures:  CBC Latest Ref Rng & Units 08/25/2018 08/24/2018 08/24/2018  WBC 4.0 - 10.5 K/uL 6.0 - 5.9  Hemoglobin 12.0 - 15.0 g/dL 16.114.7 09.614.3 04.514.8  Hematocrit 36.0 - 46.0 % 39.3 42.0 39.7  Platelets 150 - 400 K/uL 213 - 213   BMP Latest Ref Rng & Units 08/28/2018 08/28/2018 08/27/2018  Glucose 70 - 99 mg/dL 90 409(W105(H) 119(J105(H)  BUN 8 - 23 mg/dL 7(L) 7(L) 10  Creatinine 0.44 - 1.00 mg/dL 4.780.59 2.950.54 6.210.53  BUN/Creat Ratio 12 - 28 - - -  Sodium 135 - 145 mmol/L 132(L) 133(L) 129(L)  Potassium 3.5 - 5.1 mmol/L 4.2 3.7 4.2  Chloride 98 - 111 mmol/L 101 100 98  CO2 22 - 32 mmol/L 24 24 24   Calcium 8.9 - 10.3 mg/dL 9.1 3.0(Q8.7(L) 6.5(H8.7(L)   8/467/25 Urine Osm : 184 Urine Sodium : 37  CT Head wo  contrast   FINDINGS: Brain: No evidence of acute infarction, hemorrhage, hydrocephalus, extra-axial collection or mass lesion/mass effect.   Atrophy and chronic small-vessel white matter ischemic changes again noted.   Vascular: Carotid atherosclerotic calcifications again noted.   Skull: Normal. Negative for fracture or focal lesion.   Sinuses/Orbits: Chronic mucosal thickening in scattered ethmoid air cells and LEFT sphenoid sinus again noted. No acute abnormality.   Other: None   IMPRESSION: 1. No evidence of acute intracranial abnormality 2. Atrophy and chronic small-vessel white matter ischemic changes.    Chest xray  FINDINGS: The heart is mildly enlarged. The lungs are free of focal consolidations and pleural effusions. No pulmonary edema. Chronic changes in both shoulders.   IMPRESSION: Cardiomegaly. No evidence for acute pulmonary abnormality.     MR Brain wo contrast  FINDINGS: Brain: Diffusion-weighted images demonstrate no acute or subacute infarction. No areas of restricted diffusion are present.   Advanced atrophy and diffuse white matter disease is similar to the prior exam. Dilated perivascular spaces are again seen throughout the basal ganglia. There is a remote lacunar infarct of the right thalamus. White matter changes extend into the brainstem. Cerebellum is normal.   Vascular: Flow is present in the major intracranial arteries.   Skull and upper cervical spine: Degenerative changes are present in the upper cervical spine without significant change. Craniocervical junction is normal. Midline structures are otherwise within normal limits.   Sinuses/Orbits: Chronic posterior left ethmoid and sinus mucosal disease is present. There are no fluid levels. The paranasal sinuses and mastoid air cells are otherwise clear. A right lens replacement is present. Globes and orbits are within normal limits.   CS IMPRESSION: 1. No acute or global  intracranial abnormality to explain the patient's encephalopathy. 2. Advanced atrophy and diffuse white matter disease is similar the prior study. This likely reflects the sequela of chronic microvascular ischemia. 3. Remote lacunar infarct of the right thalamus. 4. Stable chronic sinus disease.     Discharge Instructions: Discharge Instructions     Call MD for:  difficulty breathing, headache or visual disturbances   Complete by: As directed    Call MD for:  persistant dizziness or light-headedness   Complete by: As directed    Call MD for:  persistant nausea and vomiting   Complete by: As directed    Diet - low sodium heart healthy   Complete by: As directed    Discharge instructions   Complete by: As directed    Ms.Boldin,  Thank you for allowing us to care for you  Your symptoms are believed  to have been caused by having low sodium. We gave you sodium in IV fluids and you started to do much better. Please follow up with your primary care physician on Friday 7/31 or Monday 8/3.   My best,  Dr.Kolter Reaver   Increase activity slowly   Complete by: As directed        Signed:  Thurmon FairJeff Soniya Ashraf, MD PGY1  [phone]

## 2018-09-12 ENCOUNTER — Other Ambulatory Visit: Payer: Self-pay | Admitting: Gastroenterology

## 2018-09-12 DIAGNOSIS — R110 Nausea: Secondary | ICD-10-CM

## 2018-09-12 DIAGNOSIS — R634 Abnormal weight loss: Secondary | ICD-10-CM

## 2018-09-12 DIAGNOSIS — R1031 Right lower quadrant pain: Secondary | ICD-10-CM

## 2018-09-18 ENCOUNTER — Ambulatory Visit: Payer: Medicare Other | Admitting: Cardiology

## 2018-09-27 ENCOUNTER — Ambulatory Visit: Payer: Medicare Other | Admitting: Cardiology

## 2018-09-27 NOTE — Progress Notes (Deleted)
Patient is here for follow up visit.  Subjective:   @Patient  ID: [name], female    DOB: [birth year], 85 y.o.   MRN: [id number]  No chief complaint on file.   HPI  85 y/o Caucasian female with hypertension, h/o CVA in 2017 with minimal left sided sensory deficit, hyperlipidemia, h/o hip replacement, h/o cholecystectomy 07/2017, was seen by me during her hospitalization in 01/2018 with tachycardia.   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 I offered the patient evaluation by EP and consideration for ablation. However, patient preferred medical management. I started the patient on sotalol 80 mg twice daily Now admitted with palpitations. Patient was subsequently in the emergency room complains of palpitations that had resolved. EKG showed normal sinus rhythm. She wore event monitor with no recurrence of SVT or any other arrhtymia seen.  Patient is here for 4 week follow up with her daughter. She continues to have symptoms of palpitations only at night. They never occur during the daytime. She is worried about her blood pressure given h/o of prior stroke.   Past Medical History:  Diagnosis Date  . Acute ischemic stroke ([place]) 12/28/2015   [name] 12/28/2015  . Arthritis    "hands" (12/28/2015)  . GERD (gastroesophageal reflux disease)   . High cholesterol   . Hypertension   . PONV (postoperative nausea and vomiting)    "w/1st hip replacement"  . Symptomatic cholelithiasis   . Thalamic infarct, acute ([place]) 12/30/2015     Past Surgical History:  Procedure Laterality Date  . CATARACT EXTRACTION W/ INTRAOCULAR LENS IMPLANT Right ~ 2011  . CHOLECYSTECTOMY N/A 08/17/2017   Procedure: LAPAROSCOPIC CHOLECYSTECTOMY WITH INTRAOPERATIVE CHOLANGIOGRAM ERAS PATHWAY;  Surgeon: Judeth Horn, MD;  Location: [facility];  Service: General;  Laterality: N/A;  . HIP CLOSED REDUCTION Right 09/29/2016   Procedure: CLOSED REDUCTION RIGHT HIP;  Surgeon:  Rod Can, MD;  Location: [facility];  Service: Orthopedics;  Laterality: Right;  . JOINT REPLACEMENT    . TOTAL HIP ARTHROPLASTY  2007-2008   left-right     Social History   Socioeconomic History  . Marital status: Divorced    Spouse name: Not on file  . Number of children: 1  . Years of education: Not on file  . Highest education level: Not on file  Occupational History  . Not on file  Social Needs  . Financial resource strain: Not on file  . Food insecurity    Worry: Not on file    Inability: Not on file  . Transportation needs    Medical: Not on file    Non-medical: Not on file  Tobacco Use  . Smoking status: Never Smoker  . Smokeless tobacco: Never Used  Substance and Sexual Activity  . Alcohol use: No  . Drug use: No  . Sexual activity: Never  Lifestyle  . Physical activity    Days per week: Not on file    Minutes per session: Not on file  . Stress: Not on file  Relationships  . Social Herbalist on phone: Not on file    Gets together: Not on file    Attends religious service: Not on file    Active member of club or organization: Not on file    Attends meetings of clubs or organizations: Not on file    Relationship status: Not on file  . Intimate partner violence    Fear of current or  ex partner: Not on file    Emotionally abused: Not on file    Physically abused: Not on file    Forced sexual activity: Not on file  Other Topics Concern  . Not on file  Social History Narrative  . Not on file     Current Outpatient Medications on File Prior to Visit  Medication Sig Dispense Refill  . amLODipine (NORVASC) 10 MG tablet Take 1 tablet (10 mg total) by mouth daily. 90 tablet 3  . aspirin EC 81 MG tablet Take 1 tablet (81 mg total) by mouth daily. 150 tablet 2  . Calcium Carb-Cholecalciferol (CALCIUM 600+D3 PO) Take 1 tablet by mouth 2 (two) times daily.    Marland Kitchen latanoprost (XALATAN) 0.005 % ophthalmic solution Place 1 drop into both eyes at  bedtime.    Marland Kitchen lisinopril (PRINIVIL,ZESTRIL) 20 MG tablet Take 1 tablet (20 mg total) by mouth daily. 45 tablet 3  . Multiple Vitamin (MULTI-VITAMINS) TABS Take 1 tablet by mouth daily.     . Omega-3 Fatty Acids (FISH OIL PO) Take 1 tablet by mouth daily.    Marland Kitchen omeprazole (PRILOSEC) 40 MG capsule Take 40 mg by mouth 2 (two) times a day.    . ondansetron (ZOFRAN) 4 MG tablet Take 4 mg by mouth every 8 (eight) hours as needed for nausea or vomiting.    . rosuvastatin (CRESTOR) 5 MG tablet Take 1 tablet (5 mg total) by mouth daily at 6 PM. 30 tablet 1  . sotalol (BETAPACE) 80 MG tablet Take 1 tablet (80 mg total) by mouth daily. 90 tablet 3   No current facility-administered medications on file prior to visit.     Cardiovascular studies:  EKG 03/01/2018: Sinus rhythm 64 bpm. Normal axis. Normal conduction. QTC interval was 73 msec  Event monitor 03/01/2018- 03/27/2018: No arrhtymias seen  Hospital echocardiogram 02/20/2018: Left ventricle: The cavity size was normal. Systolic function was vigorous. The estimated ejection fraction was in the range of 65% to 70%. Wall motion was normal; there were no regional wall motion abnormalities. Doppler parameters are consistent with abnormal left ventricular relaxation (grade 1 diastolic dysfunction). Aortic valve: There was mild regurgitation. Pericardium, extracardiac: A small to moderate circumferential pericardial effusion without tamponade physiology. No significant change compared to previous study in 2017.  Carotid Doppler 2017:  - The vertebral arteries appear patent with antegrade flow. - Findings consistent with 1- 39 percent stenosis involving the   right internal carotid artery and the left internal carotid   artery. - Incidental finding: left thyroid cyst. Recent labs:  CBC Latest Ref Rng & Units 03/20/2018  WBC 4.0 - 10.5 K/uL 8.7  Hemoglobin 12.0 - 15.0 g/dL 14.8  Hematocrit 36.0 - 46.0 % 43.3  Platelets 150 - 400 K/uL 181    02/21/2018: EGFR >60. TSH 2.8 normal.    CMP Latest Ref Rng & Units 03/25/2018  Glucose 65 - 99 mg/dL 107(H)  BUN 8 - 27 mg/dL 9  Creatinine 0.57 - 1.00 mg/dL 0.73  Sodium 134 - 144 mmol/L 134  Potassium 3.5 - 5.2 mmol/L 4.9  Chloride 96 - 106 mmol/L 93(L)  CO2 20 - 29 mmol/L 24  Calcium 8.7 - 10.3 mg/dL 9.8  Total Protein 6.5 - 8.1 g/dL -  Total Bilirubin 0.3 - 1.2 mg/dL -  Alkaline Phos 38 - 126 U/L -  AST 15 - 41 U/L -  ALT 0 - 44 U/L -   Lipid Panel 12/29/2015: Component Value   CHOL 175  TRIG 171 (H)   HDL 36 (L)   CHOLHDL 4.9   VLDL 34   LDLCALC 105 (H)   HbA1c 5.1% in 2017  EKG 04/10/2018: Sinus rhythm 63 bpm Normal axis. Normal conduction. Normal QTc 451 msec   Review of Systems  Constitution: Negative for decreased appetite, malaise/fatigue, weight gain and weight loss.  HENT: Negative for congestion.   Eyes: Negative for visual disturbance.  Cardiovascular: Positive for palpitations. Negative for chest pain, claudication, dyspnea on exertion, leg swelling and syncope.  Respiratory: Negative for shortness of breath.   Endocrine: Negative for cold intolerance.  Hematologic/Lymphatic: Does not bruise/bleed easily.  Skin: Negative for itching and rash.  Musculoskeletal: Negative for myalgias.  Gastrointestinal: Negative for abdominal pain, nausea and vomiting.  Genitourinary: Negative for dysuria.  Neurological: Negative for dizziness and weakness.  Psychiatric/Behavioral: The patient is not nervous/anxious.   All other systems reviewed and are negative.      Objective:   *** There were no vitals filed for this visit.   Physical Exam  Constitutional: She appears well-developed and well-nourished. No distress.  HENT:  Head: Atraumatic.  Eyes: Conjunctivae are normal.  Neck: Neck supple. No JVD present. No thyromegaly present.  Cardiovascular: Normal rate, regular rhythm, normal heart sounds and intact distal pulses. Exam reveals no gallop.  No  murmur heard. Pulmonary/Chest: Effort normal and breath sounds normal.  Abdominal: Soft. Bowel sounds are normal.  Musculoskeletal: Normal range of motion.        General: No edema.  Neurological: She is alert.  Skin: Skin is warm and dry.  Psychiatric: She has a normal mood and affect.        Assessment & Recommendations:   85 y/o Caucasian female with hypertension, h/o CVA in 2017 with minimal left sided sensory deficit, hyperlipidemia, h/o hip replacement, h/o cholecystectomy 07/2017, now with paroxysmal SVT, likely AVNRT  PSVT: No recurrence of SVT of Sotalol 80 mg daily. QTc is acceptable.  She continues to have palpitations, which are not correlated with any arrhthymias. I suspect some of this is related to anxiety.   Hypertension: Currently on amlodipine 10 mg daily, lisinopril 10 mg daily. Increased lisinopril to 20 mg daily.   H/o CVA: No recent events.  Repeat BMP check in 2 weeks. Follow up with me in 3 months.  Nigel Mormon, MD [facility] Pager: [phone] Office: [phone] If no answer Cell [phone]

## 2018-10-04 ENCOUNTER — Other Ambulatory Visit: Payer: Self-pay | Admitting: Cardiology

## 2018-10-04 DIAGNOSIS — I10 Essential (primary) hypertension: Secondary | ICD-10-CM

## 2018-10-14 ENCOUNTER — Other Ambulatory Visit: Payer: Medicare Other

## 2018-10-18 ENCOUNTER — Ambulatory Visit
Admission: RE | Admit: 2018-10-18 | Discharge: 2018-10-18 | Disposition: A | Discharge status: Home / Self Care | Payer: Medicare Other | Source: Ambulatory Visit | Attending: Gastroenterology | Admitting: Gastroenterology

## 2018-10-18 DIAGNOSIS — R634 Abnormal weight loss: Secondary | ICD-10-CM

## 2018-10-18 DIAGNOSIS — R1031 Right lower quadrant pain: Secondary | ICD-10-CM

## 2018-10-18 DIAGNOSIS — R110 Nausea: Secondary | ICD-10-CM

## 2018-10-18 MED ORDER — IOPAMIDOL (ISOVUE-300) INJECTION 61%
100.0000 mL | Freq: Once | INTRAVENOUS | Status: AC | PRN
  Administered 2018-10-18: 17:00:00 100 mL via INTRAVENOUS

## 2018-10-25 ENCOUNTER — Encounter: Payer: Self-pay | Admitting: Cardiology

## 2018-10-25 ENCOUNTER — Other Ambulatory Visit: Payer: Self-pay

## 2018-10-25 ENCOUNTER — Ambulatory Visit (INDEPENDENT_AMBULATORY_CARE_PROVIDER_SITE_OTHER): Payer: Medicare Other | Admitting: Cardiology

## 2018-10-25 VITALS — BP 146/64 | HR 72 | Temp 97.3°F | Ht 60.0 in | Wt 97.2 lb

## 2018-10-25 DIAGNOSIS — I471 Supraventricular tachycardia: Secondary | ICD-10-CM | POA: Diagnosis not present

## 2018-10-25 DIAGNOSIS — I10 Essential (primary) hypertension: Secondary | ICD-10-CM

## 2018-10-25 NOTE — Progress Notes (Signed)
Patient is here for follow up visit.  Subjective:   @Patient  ID: , female    DOB: [birth year], 85 y.o.   MRN: [id number]   Chief Complaint  Patient presents with  . PSVT  . Hypertension  . Follow-up    3 month    HPI  85 y/o Caucasian female with hypertension, h/o CVA in 2017 with minimal left sided sensory deficit, hyperlipidemia, h/o hip replacement, h/o cholecystectomy 07/2017, was seen by me during her hospitalization in 01/2018 with tachycardia.   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 I offered the patient evaluation by EP and consideration for ablation. However, patient preferred medical management. I started the patient on sotalol 80 mg twice daily Now admitted with palpitations. Patient was subsequently in the emergency room complains of palpitations that had resolved. EKG showed normal sinus rhythm. She wore event monitor with no recurrence of SVT or any other arrhtymia seen.  Patient was recently admitted to [facility] due to encephalopathy-deemed to be due hyponatremia 121 likely from SIADH. Na improved to 133 on discharge.  Today, the patient states that she is feeling "the best I have felt in a long time" and denies any complaints. She denies any significant palpitations. She has regular f/u w/PCP for hypertension and hyponatremia.     Past Medical History:  Diagnosis Date  . Acute ischemic stroke (HCC) 12/28/2015   12/30/2015 12/28/2015  . Arthritis    "hands" (12/28/2015)  . GERD (gastroesophageal reflux disease)   . High cholesterol   . Hypertension   . PONV (postoperative nausea and vomiting)    "w/1st hip replacement"  . Symptomatic cholelithiasis   . Thalamic infarct, acute (HCC) 12/30/2015     Past Surgical History:  Procedure Laterality Date  . CATARACT EXTRACTION W/ INTRAOCULAR LENS IMPLANT Right ~ 2011  . CHOLECYSTECTOMY N/A 08/17/2017   Procedure:  LAPAROSCOPIC CHOLECYSTECTOMY WITH INTRAOPERATIVE CHOLANGIOGRAM ERAS PATHWAY;  Surgeon: 08/19/2017, MD;  Location: [facility] OR;  Service: General;  Laterality: N/A;  . HIP CLOSED REDUCTION Right 09/29/2016   Procedure: CLOSED REDUCTION RIGHT HIP;  Surgeon: 10/01/2016, MD;  Location: [facility] OR;  Service: Orthopedics;  Laterality: Right;  . JOINT REPLACEMENT    . TOTAL HIP ARTHROPLASTY  2007-2008   left-right     Social History   Socioeconomic History  . Marital status: Divorced    Spouse name: Not on file  . Number of children: 1  . Years of education: Not on file  . Highest education level: Not on file  Occupational History  . Not on file  Social Needs  . Financial resource strain: Not on file  . Food insecurity    Worry: Not on file    Inability: Not on file  . Transportation needs    Medical: Not on file    Non-medical: Not on file  Tobacco Use  . Smoking status: Never Smoker  . Smokeless tobacco: Never Used  Substance and Sexual Activity  . Alcohol use: No  . Drug use: No  . Sexual activity: Never  Lifestyle  . Physical activity    Days per week: Not on file    Minutes per session: Not on file  . Stress: Not on file  Relationships  . Social 03-05-1973 on phone: Not on file    Gets together: Not on file    Attends religious service: Not on file  Active member of club or organization: Not on file    Attends meetings of clubs or organizations: Not on file    Relationship status: Not on file  . Intimate partner violence    Fear of current or ex partner: Not on file    Emotionally abused: Not on file    Physically abused: Not on file    Forced sexual activity: Not on file  Other Topics Concern  . Not on file  Social History Narrative  . Not on file     Current Outpatient Medications on File Prior to Visit  Medication Sig Dispense Refill  . amLODipine (NORVASC) 10 MG tablet Take 1 tablet (10 mg total) by mouth daily. 90 tablet 3  . aspirin EC 81 MG  tablet Take 1 tablet (81 mg total) by mouth daily. 150 tablet 2  . Calcium Carb-Cholecalciferol (CALCIUM 600+D3 PO) Take 1 tablet by mouth 2 (two) times daily.    Marland Kitchen. latanoprost (XALATAN) 0.005 % ophthalmic solution Place 1 drop into both eyes at bedtime.    Marland Kitchen. lisinopril (ZESTRIL) 20 MG tablet TAKE 1 TABLET BY MOUTH EVERY DAY 90 tablet 1  . Multiple Vitamin (MULTI-VITAMINS) TABS Take 1 tablet by mouth daily.     . Omega-3 Fatty Acids (FISH OIL PO) Take 1 tablet by mouth daily.    Marland Kitchen. omeprazole (PRILOSEC) 40 MG capsule Take 40 mg by mouth 2 (two) times a day.    . ondansetron (ZOFRAN) 4 MG tablet Take 4 mg by mouth every 8 (eight) hours as needed for nausea or vomiting.    . rosuvastatin (CRESTOR) 5 MG tablet Take 1 tablet (5 mg total) by mouth daily at 6 PM. 30 tablet 1  . sotalol (BETAPACE) 80 MG tablet Take 1 tablet (80 mg total) by mouth daily. 90 tablet 3   No current facility-administered medications on file prior to visit.     Cardiovascular studies:  EKG 03/01/2018: Sinus rhythm 64 bpm. Normal axis. Normal conduction. QTC interval was 73 msec  Event monitor 03/01/2018- 03/27/2018: No arrhtymias seen  Hospital echocardiogram 02/20/2018: Left ventricle: The cavity size was normal. Systolic function was vigorous. The estimated ejection fraction was in the range of 65% to 70%. Wall motion was normal; there were no regional wall motion abnormalities. Doppler parameters are consistent with abnormal left ventricular relaxation (grade 1 diastolic dysfunction). Aortic valve: There was mild regurgitation. Pericardium, extracardiac: A small to moderate circumferential pericardial effusion without tamponade physiology. No significant change compared to previous study in 2017.  Carotid Doppler 2017:  - The vertebral arteries appear patent with antegrade flow. - Findings consistent with 1- 39 percent stenosis involving the   right internal carotid artery and the left internal carotid   artery. -  Incidental finding: left thyroid cyst.  Recent labs: Results for Ardith DarkCLARK, Nikiah S (MRN [id number]) as of 10/25/2018 10:33  Ref. Range 08/28/2018 02:57 08/28/2018 11:46  Sodium Latest Ref Range: 135 - 145 mmol/L 133 (L) 132 (L)  Potassium Latest Ref Range: 3.5 - 5.1 mmol/L 3.7 4.2  Chloride Latest Ref Range: 98 - 111 mmol/L 100 101  CO2 Latest Ref Range: 22 - 32 mmol/L 24 24  Glucose Latest Ref Range: 70 - 99 mg/dL 621105 (H) 90  BUN Latest Ref Range: 8 - 23 mg/dL 7 (L) 7 (L)  Creatinine Latest Ref Range: 0.44 - 1.00 mg/dL 3.080.54 6.570.59  Calcium Latest Ref Range: 8.9 - 10.3 mg/dL 8.7 (L) 9.1  Anion gap Latest Ref Range: 5 -  15  9 7   GFR, Est Non African American Latest Ref Range: >60 mL/min >60 >60  GFR, Est African American Latest Ref Range: >60 mL/min >60 >60    Review of Systems  Constitution: Negative for decreased appetite, malaise/fatigue, weight gain and weight loss.  HENT: Negative for congestion.   Eyes: Negative for visual disturbance.  Cardiovascular: Positive for palpitations. Negative for chest pain, claudication, dyspnea on exertion, leg swelling and syncope.  Respiratory: Negative for shortness of breath.   Endocrine: Negative for cold intolerance.  Hematologic/Lymphatic: Does not bruise/bleed easily.  Skin: Negative for itching and rash.  Musculoskeletal: Negative for myalgias.  Gastrointestinal: Negative for abdominal pain, nausea and vomiting.  Genitourinary: Negative for dysuria.  Neurological: Negative for dizziness and weakness.  Psychiatric/Behavioral: The patient is not nervous/anxious.   All other systems reviewed and are negative.      Objective:    Vitals:   10/25/18 1019  BP: (!) 146/64  Pulse: 72  Temp: (!) 97.3 F (36.3 C)  SpO2: 99%    Physical Exam  Constitutional: She is oriented to person, place, and time. She appears well-developed and well-nourished. No distress.  Feels great   HENT:  Head: Normocephalic and atraumatic.  Eyes: Pupils are  equal, round, and reactive to light. Conjunctivae are normal.  Neck: No JVD present.  Cardiovascular: Normal rate, regular rhythm and intact distal pulses.  Pulmonary/Chest: Effort normal and breath sounds normal. She has no wheezes. She has no rales.  Abdominal: Soft. Bowel sounds are normal. There is no rebound.  Musculoskeletal:        General: No edema.  Lymphadenopathy:    She has no cervical adenopathy.  Neurological: She is alert and oriented to person, place, and time. No cranial nerve deficit.  Skin: Skin is warm and dry.  Psychiatric: She has a normal mood and affect.  Nursing note and vitals reviewed.        Assessment & Recommendations:   85 y/o Caucasian female with hypertension, h/o CVA in 2017 with minimal left sided sensory deficit, hyperlipidemia, PSVT.  PSVT: No recurrence of SVT of Sotalol 80 mg daily. QTc is acceptable.   Hypertension: Currently on amlodipine 10 mg daily, lisinopril 20 mg daily. BP mildly elevated. Reportedly better at home. No changes made today. Continue f/u w/PCP.  H/o CVA: No recent events.  F/u in 6 months.  Nigel Mormon, MD [facility] Pager: [phone] Office: [phone] If no answer Cell [phone]

## 2018-10-30 ENCOUNTER — Other Ambulatory Visit: Payer: Self-pay

## 2018-10-30 DIAGNOSIS — I10 Essential (primary) hypertension: Secondary | ICD-10-CM

## 2018-10-30 MED ORDER — LISINOPRIL 20 MG PO TABS: 20.0000 mg | ORAL_TABLET | Freq: Every day | ORAL | 1 refills | Status: DC

## 2018-11-06 ENCOUNTER — Other Ambulatory Visit: Payer: Self-pay

## 2018-11-06 DIAGNOSIS — I471 Supraventricular tachycardia, unspecified: Secondary | ICD-10-CM

## 2018-11-06 MED ORDER — SOTALOL HCL 80 MG PO TABS: 80.0000 mg | ORAL_TABLET | Freq: Every day | ORAL | 3 refills | Status: DC

## 2018-12-16 ENCOUNTER — Encounter (HOSPITAL_COMMUNITY): Payer: Self-pay | Admitting: Emergency Medicine

## 2018-12-16 ENCOUNTER — Emergency Department (HOSPITAL_COMMUNITY)
Admission: EM | Admit: 2018-12-16 | Discharge: 2018-12-17 | Disposition: A | Discharge status: Home / Self Care | Payer: Medicare Other | Attending: Emergency Medicine | Admitting: Emergency Medicine

## 2018-12-16 ENCOUNTER — Emergency Department (HOSPITAL_COMMUNITY): Payer: Medicare Other

## 2018-12-16 ENCOUNTER — Other Ambulatory Visit: Payer: Self-pay

## 2018-12-16 DIAGNOSIS — N3000 Acute cystitis without hematuria: Secondary | ICD-10-CM | POA: Insufficient documentation

## 2018-12-16 DIAGNOSIS — Z7982 Long term (current) use of aspirin: Secondary | ICD-10-CM | POA: Diagnosis not present

## 2018-12-16 DIAGNOSIS — I10 Essential (primary) hypertension: Secondary | ICD-10-CM | POA: Diagnosis not present

## 2018-12-16 DIAGNOSIS — R002 Palpitations: Secondary | ICD-10-CM | POA: Insufficient documentation

## 2018-12-16 DIAGNOSIS — Z96653 Presence of artificial knee joint, bilateral: Secondary | ICD-10-CM | POA: Insufficient documentation

## 2018-12-16 DIAGNOSIS — Z79899 Other long term (current) drug therapy: Secondary | ICD-10-CM | POA: Diagnosis not present

## 2018-12-16 DIAGNOSIS — R300 Dysuria: Secondary | ICD-10-CM | POA: Diagnosis present

## 2018-12-16 LAB — BASIC METABOLIC PANEL
Anion gap: 15 (ref 5–15)
BUN: 7 mg/dL — ABNORMAL LOW (ref 8–23)
CO2: 26 mmol/L (ref 22–32)
Calcium: 9.6 mg/dL (ref 8.9–10.3)
Chloride: 90 mmol/L — ABNORMAL LOW (ref 98–111)
Creatinine, Ser: 0.52 mg/dL (ref 0.44–1.00)
GFR calc Af Amer: >60 mL/min (ref >60)
GFR calc non Af Amer: >60 mL/min (ref >60)
Glucose, Bld: 113 mg/dL — ABNORMAL HIGH (ref 70–99)
Potassium: 4.4 mmol/L (ref 3.5–5.1)
Sodium: 131 mmol/L — ABNORMAL LOW (ref 135–145)

## 2018-12-16 LAB — CBC
HCT: 38.7 % (ref 36.0–46.0)
Hemoglobin: 13.6 g/dL (ref 12.0–15.0)
MCH: 30.6 pg (ref 26.0–34.0)
MCHC: 35.1 g/dL (ref 30.0–36.0)
MCV: 87 fL (ref 80.0–100.0)
Platelets: 225 10*3/uL (ref 150–400)
RBC: 4.45 MIL/uL (ref 3.87–5.11)
RDW: 11.4 % — ABNORMAL LOW (ref 11.5–15.5)
WBC: 5.7 10*3/uL (ref 4.0–10.5)
nRBC: 0 % (ref 0.0–0.2)

## 2018-12-16 LAB — URINALYSIS, ROUTINE W REFLEX MICROSCOPIC
Bacteria, UA: NONE SEEN
Bilirubin Urine: NEGATIVE
Glucose, UA: NEGATIVE mg/dL
Hgb urine dipstick: NEGATIVE
Ketones, ur: 5 mg/dL — AB
Leukocytes,Ua: NEGATIVE
Nitrite: NEGATIVE
Protein, ur: 30 mg/dL — AB
Specific Gravity, Urine: 1.017 (ref 1.005–1.030)
pH: 7 (ref 5.0–8.0)

## 2018-12-16 LAB — TROPONIN I (HIGH SENSITIVITY)
Troponin I (High Sensitivity): 7 ng/L (ref <18)
Troponin I (High Sensitivity): 6 ng/L (ref <18)

## 2018-12-16 MED ORDER — CIPROFLOXACIN HCL 500 MG PO TABS: 500.0000 mg | ORAL_TABLET | Freq: Two times a day (BID) | ORAL | 0 refills | Status: DC

## 2018-12-16 MED ORDER — SODIUM CHLORIDE 0.9% FLUSH: 3.0000 mL | Freq: Once | INTRAVENOUS | Status: DC

## 2018-12-16 NOTE — ED Provider Notes (Signed)
[place] EMERGENCY DEPARTMENT Provider Note   CSN: [id number] Arrival date & time: 12/16/18  1422     History   Chief Complaint Chief Complaint  Patient presents with  . Urinary Tract Infection  . Palpitations    HPI [name] is a 85 y.o. female.     Patient is an 86 year old female with past medical history of hypertension, prior CVA.  She presents today for evaluation of dysuria.  Patient recently treated with a UTI, but does not feel as though she is any better.  She called her doctor today to inform her that she has not improved and also mentioned that she was having palpitations.  She was advised to come to the ER to be evaluated.  She denies to me she is having any chest pain or difficulty breathing.  She denies any fevers, chills, or abdominal pain.  The history is provided by the patient.  Urinary Tract Infection Pain quality:  Burning Pain severity:  Moderate Onset quality:  Gradual Duration:  1 week Timing:  Constant Progression:  Unchanged Chronicity:  New Relieved by:  Nothing Worsened by:  Nothing Ineffective treatments:  Antibiotics Palpitations   Past Medical History:  Diagnosis Date  . Acute ischemic stroke ([place]) 12/28/2015   Archie Endo 12/28/2015  . Arthritis    "hands" (12/28/2015)  . GERD (gastroesophageal reflux disease)   . High cholesterol   . Hypertension   . PONV (postoperative nausea and vomiting)    "w/1st hip replacement"  . Symptomatic cholelithiasis   . Thalamic infarct, acute ([place]) 12/30/2015    Patient Active Problem List   Diagnosis Date Noted  . Acute hyponatremia 08/25/2018  . Adjustment disorder 08/25/2018  . PSVT (paroxysmal supraventricular tachycardia) ([place]) 02/21/2018  . Hyperlipidemia 08/28/2016  . Sleep disturbance   . Hemiparesis of right nondominant side due to cerebral infarction   . Ataxia, late effect of cerebrovascular disease   . Alteration of sensation as late effect of stroke   .  Essential hypertension 12/28/2015  . Acute encephalopathy 12/23/2015    Past Surgical History:  Procedure Laterality Date  . CATARACT EXTRACTION W/ INTRAOCULAR LENS IMPLANT Right ~ 2011  . CHOLECYSTECTOMY N/A 08/17/2017   Procedure: LAPAROSCOPIC CHOLECYSTECTOMY WITH INTRAOPERATIVE CHOLANGIOGRAM ERAS PATHWAY;  Surgeon: Judeth Horn, MD;  Location: [place];  Service: General;  Laterality: N/A;  . HIP CLOSED REDUCTION Right 09/29/2016   Procedure: CLOSED REDUCTION RIGHT HIP;  Surgeon: Rod Can, MD;  Location: [place];  Service: Orthopedics;  Laterality: Right;  . JOINT REPLACEMENT    . TOTAL HIP ARTHROPLASTY  2007-2008   left-right     OB History   No obstetric history on file.      Home Medications    Prior to Admission medications   Medication Sig Start Date End Date Taking? Authorizing Provider  acetaminophen (TYLENOL) 500 MG tablet Take 250 mg by mouth daily as needed for headache (pain).   Yes [provider]  amLODipine (NORVASC) 10 MG tablet Take 1 tablet (10 mg total) by mouth daily. 04/10/18  Yes Patwardhan, Reynold Bowen, MD  aspirin EC 81 MG tablet Take 1 tablet (81 mg total) by mouth daily. 02/21/18 02/21/19 Yes Guilford Shi, MD  Calcium Carb-Cholecalciferol (CALCIUM 600+D3 PO) Take 1 tablet by mouth at bedtime.    Yes [provider]  latanoprost (XALATAN) 0.005 % ophthalmic solution Place 1 drop into both eyes at bedtime.   Yes [provider]  lisinopril (ZESTRIL) 20 MG tablet  Take 1 tablet (20 mg total) by mouth daily. 10/30/18  Yes Patwardhan, Anabel BeneManish J, MD  Multiple Vitamin (MULTIVITAMIN WITH MINERALS) TABS tablet Take 1 tablet by mouth daily with lunch. One a day Women's   Yes [provider]  omeprazole (PRILOSEC) 40 MG capsule Take 40 mg by mouth daily.    Yes [provider]  rosuvastatin (CRESTOR) 5 MG tablet Take 1 tablet (5 mg total) by mouth daily at 6 PM. Patient taking differently: Take 5 mg by mouth daily.  01/06/16   Yes Angiulli, Mcarthur Rossettianiel J, PA-C  sertraline (ZOLOFT) 25 MG tablet Take 25 mg by mouth daily.  09/30/18  Yes [provider]  sotalol (BETAPACE) 80 MG tablet Take 1 tablet (80 mg total) by mouth daily. 11/06/18  Yes Patwardhan, Manish J, MD  traZODone (DESYREL) 50 MG tablet Take 25 mg by mouth at bedtime.  09/03/18  Yes [provider]  nitrofurantoin, macrocrystal-monohydrate, (MACROBID) 100 MG capsule Take 100 mg by mouth 2 (two) times daily. 12/11/18   [provider]  ondansetron (ZOFRAN) 4 MG tablet Take 4 mg by mouth every 8 (eight) hours as needed for nausea or vomiting.    [provider]    Family History Family History  Problem Relation Age of Onset  . CAD Father   . Stroke Father   . Alcohol abuse Brother   . CAD Brother   . Aneurysm Brother     Social History Social History   Tobacco Use  . Smoking status: Never Smoker  . Smokeless tobacco: Never Used  Substance Use Topics  . Alcohol use: No  . Drug use: No     Allergies   Glucosamine sulfate, Amoxicillin, Ativan [lorazepam], Niacin, and Phenergan [promethazine]   Review of Systems Review of Systems  Cardiovascular: Positive for palpitations.  All other systems reviewed and are negative.    Physical Exam Updated Vital Signs BP (!) 158/63   Pulse 77   Temp 98.6 F (37 C) (Oral)   Resp 14   SpO2 100%   Physical Exam Vitals signs and nursing note reviewed.  Constitutional:      General: She is not in acute distress.    Appearance: She is well-developed. She is not diaphoretic.  HENT:     Head: Normocephalic and atraumatic.  Neck:     Musculoskeletal: Normal range of motion and neck supple.  Cardiovascular:     Rate and Rhythm: Normal rate and regular rhythm.     Heart sounds: No murmur. No friction rub. No gallop.   Pulmonary:     Effort: Pulmonary effort is normal. No respiratory distress.     Breath sounds: Normal breath sounds. No wheezing.  Abdominal:      General: Bowel sounds are normal. There is no distension.     Palpations: Abdomen is soft.     Tenderness: There is no abdominal tenderness.  Musculoskeletal: Normal range of motion.  Skin:    General: Skin is warm and dry.  Neurological:     Mental Status: She is alert and oriented to person, place, and time.      ED Treatments / Results  Labs (all labs ordered are listed, but only abnormal results are displayed) Labs Reviewed  BASIC METABOLIC PANEL - Abnormal; Notable for the following components:      Result Value   Sodium 131 (*)    Chloride 90 (*)    Glucose, Bld 113 (*)    BUN 7 (*)  All other components within normal limits  CBC - Abnormal; Notable for the following components:   RDW 11.4 (*)    All other components within normal limits  URINALYSIS, ROUTINE W REFLEX MICROSCOPIC - Abnormal; Notable for the following components:   Color, Urine AMBER (*)    APPearance CLOUDY (*)    Ketones, ur 5 (*)    Protein, ur 30 (*)    All other components within normal limits  TROPONIN I (HIGH SENSITIVITY)  TROPONIN I (HIGH SENSITIVITY)    EKG EKG Interpretation  Date/Time:  Monday December 16 2018 14:32:59 EST Ventricular Rate:  79 PR Interval:  154 QRS Duration: 86 QT Interval:  446 QTC Calculation: 511 R Axis:   82 Text Interpretation: Normal sinus rhythm Prolonged QT Abnormal ECG No significant change since 08/24/2018 Confirmed by Geoffery Lyons ([id number]) on 12/16/2018 8:39:48 PM   Radiology Dg Chest 2 View  Result Date: 12/16/2018 CLINICAL DATA:  Palpitation EXAM: CHEST - 2 VIEW COMPARISON:  08/24/2018 FINDINGS: The heart size and mediastinal contours are within normal limits. Both lungs are clear. The visualized skeletal structures are unremarkable. IMPRESSION: No active cardiopulmonary disease. Electronically Signed   By: Jasmine Pang M.D.   On: 12/16/2018 17:04    Procedures Procedures (including critical care time)  Medications Ordered in ED Medications   sodium chloride flush (NS) 0.9 % injection 3 mL (3 mLs Intravenous Not Given 12/16/18 2106)     Initial Impression / Assessment and Plan / ED Course  I have reviewed the triage vital signs and the nursing notes.  Pertinent labs & imaging results that were available during my care of the patient were reviewed by me and considered in my medical decision making (see chart for details).  Patient's EKG shows a normal sinus rhythm that is unchanged from prior studies.  She has had no significant ectopy while in the ER.  Urinalysis is not consistent with a UTI, but antibiotics will be changed as she remains symptomatic.  Otherwise, vitals are stable and she appears well.  I see no indication for admission or further work-up.  She is to return as needed for any problems.  Final Clinical Impressions(s) / ED Diagnoses   Final diagnoses:  None    ED Discharge Orders    None       Geoffery Lyons, MD 12/16/18 2312

## 2018-12-16 NOTE — Discharge Instructions (Signed)
Begin taking Cipro as prescribed.  Return to the emergency department if you develop severe chest pain, difficulty breathing, severe abdominal pain, or other new and concerning symptoms.

## 2018-12-16 NOTE — ED Triage Notes (Signed)
Pt was diagnosed 5 days ago with UTI- finished antibiotics last night. Pt called MD today due to having burning and discomfort when she urinates. Pt also told her MD that had some palpations as well. She was told to come here. Pt has hx of AFib and also feels palpitations frequently. BP 146/60, HR75, 97% on room air.

## 2018-12-16 NOTE — ED Notes (Signed)
Culture sent down w/ urinalysis

## 2018-12-17 NOTE — ED Notes (Signed)
Talked w/ mike stephens for pick up.

## 2018-12-17 NOTE — ED Notes (Signed)
All appropriate discharge materials reviewed at length with patient. Time for questions provided. Pt has no other questions at this time and verbalizes understanding of all provided materials.  

## 2019-01-29 ENCOUNTER — Other Ambulatory Visit: Payer: Self-pay

## 2019-01-29 DIAGNOSIS — I10 Essential (primary) hypertension: Secondary | ICD-10-CM

## 2019-01-29 MED ORDER — AMLODIPINE BESYLATE 10 MG PO TABS: 10.0000 mg | ORAL_TABLET | Freq: Every day | ORAL | 3 refills | Status: DC

## 2019-04-05 IMAGING — RF DG CHOLANGIOGRAM OPERATIVE
1 series · 3 of 3 positions shown · non-contrast
Comparison: Ultrasound 07/16/2017

CLINICAL DATA: 84-year-old female with a history cholelithiasis

EXAM:
INTRAOPERATIVE CHOLANGIOGRAM
TECHNIQUE: Cholangiographic images from the C-arm fluoroscopic device were
submitted for interpretation post-operatively. Please see the
procedural report for the amount of contrast and the fluoroscopy
time utilized.

[Series 1: run · 3 of 3 slices shown]
[im 1/3]
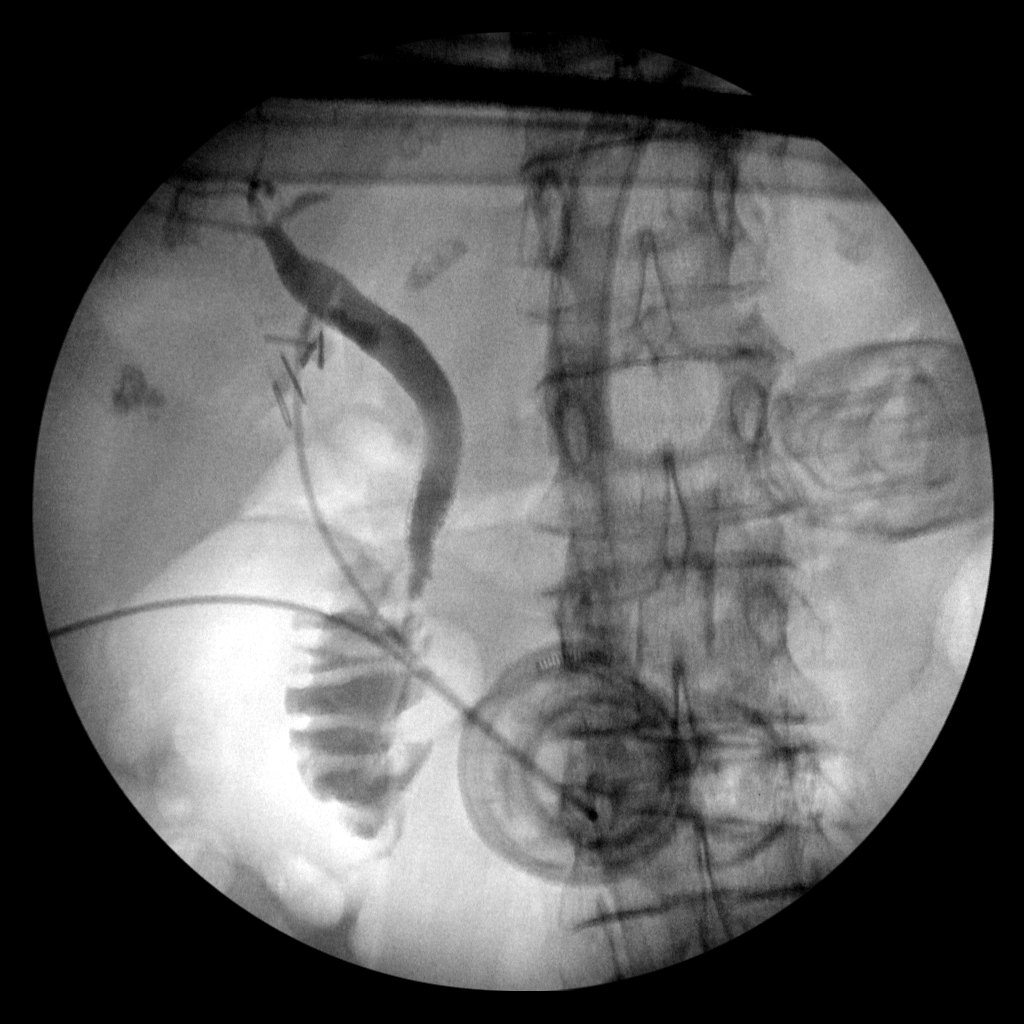
[im 2/3]
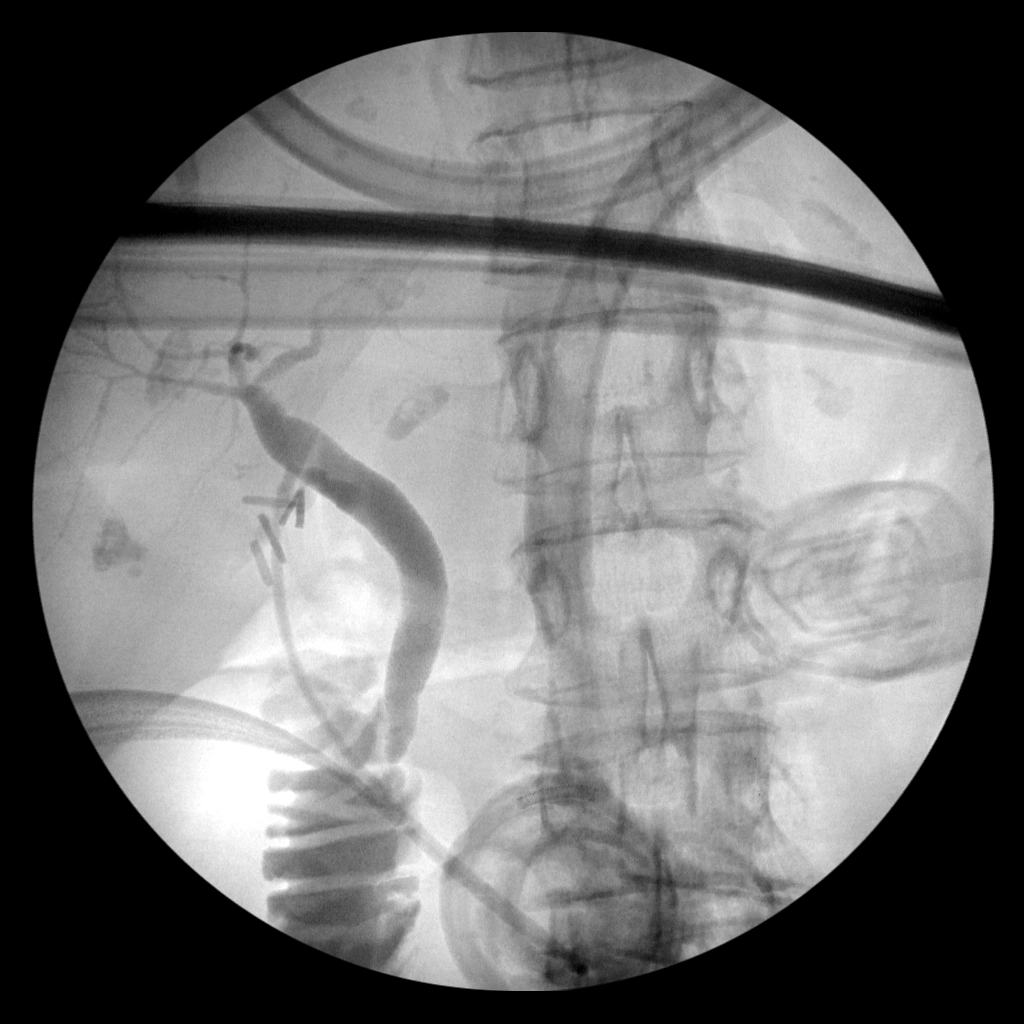
[im 3/3]
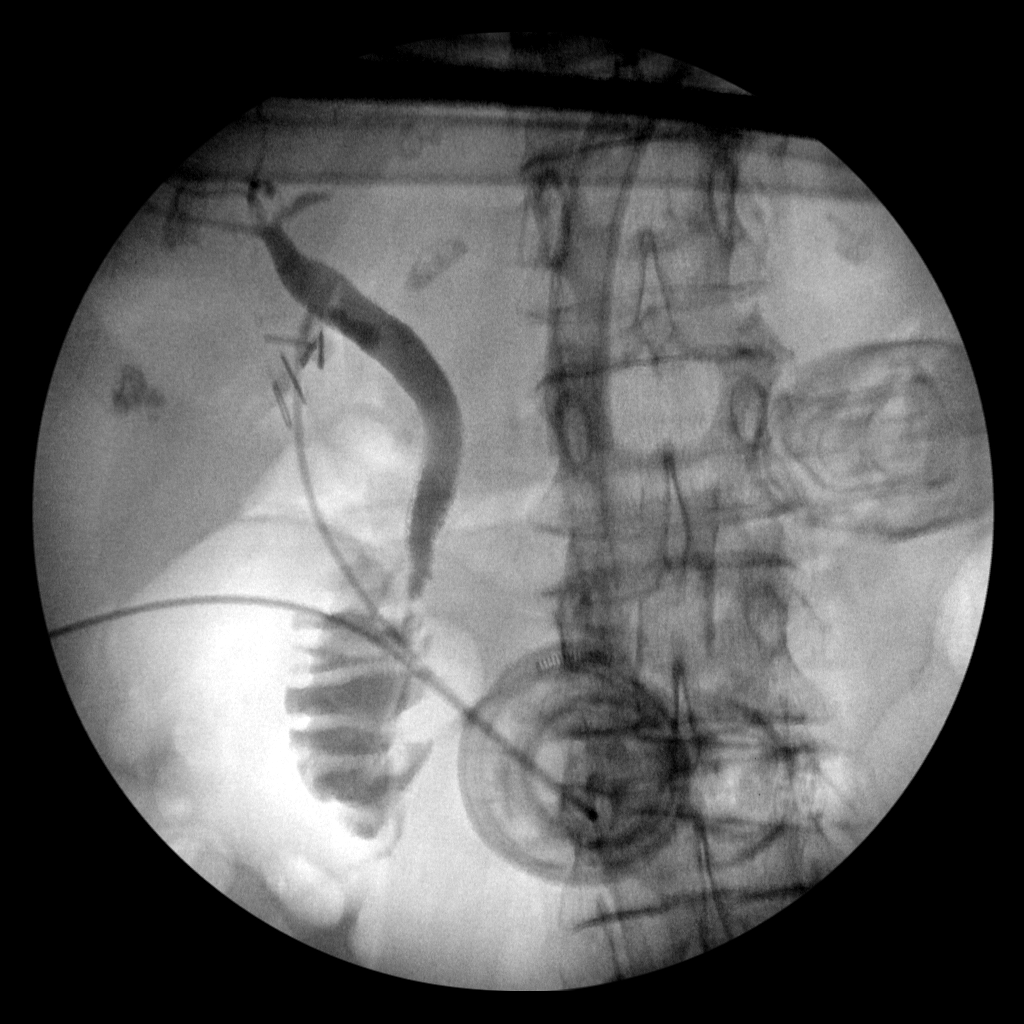

[3 of 3 positions shown; findings below may reference images not displayed]

FINDINGS: Surgical instruments project over the upper abdomen.

There is cannulation of the cystic duct/gallbladder neck, with
antegrade infusion of contrast. Caliber of the extrahepatic ductal
system within normal limits.

There is irregularity of the distal common bile duct at the ampulla,
which could potentially represent stones/debris.

Contrast crosses the ampulla
IMPRESSION: Intraoperative cholangiogram demonstrates patent ampulla with flow
into the duodenum. There is irregularity at the distal common bile
duct above the ampulla, which could potentially represent
nonobstructive debris/stones.

Please refer to the dictated operative report for full details of
intraoperative findings and procedure

## 2019-04-15 ENCOUNTER — Other Ambulatory Visit: Payer: Self-pay | Admitting: Cardiology

## 2019-04-15 DIAGNOSIS — I10 Essential (primary) hypertension: Secondary | ICD-10-CM

## 2019-04-24 ENCOUNTER — Other Ambulatory Visit: Payer: Self-pay

## 2019-04-24 ENCOUNTER — Ambulatory Visit: Payer: Medicare Other | Admitting: Cardiology

## 2019-04-24 ENCOUNTER — Encounter: Payer: Self-pay | Admitting: Cardiology

## 2019-04-24 VITALS — BP 137/53 | HR 67 | Temp 98.8°F | Resp 17 | Ht 60.0 in | Wt 104.0 lb

## 2019-04-24 DIAGNOSIS — I471 Supraventricular tachycardia: Secondary | ICD-10-CM

## 2019-04-24 DIAGNOSIS — I10 Essential (primary) hypertension: Secondary | ICD-10-CM

## 2019-04-24 DIAGNOSIS — E782 Mixed hyperlipidemia: Secondary | ICD-10-CM

## 2019-04-24 MED ORDER — SOTALOL HCL 80 MG PO TABS: 80.0000 mg | ORAL_TABLET | Freq: Every day | ORAL | 3 refills | Status: DC

## 2019-04-24 MED ORDER — ROSUVASTATIN CALCIUM 10 MG PO TABS: 10.0000 mg | ORAL_TABLET | Freq: Every day | ORAL | 3 refills | Status: DC

## 2019-04-24 NOTE — Progress Notes (Signed)
  Patient is here for follow up visit.  Subjective:   @Patient ID: [name], female    DOB: 06/30/1933, 85 y.o.   MRN: [id number]   Chief Complaint  Patient presents with  . psvt  . Follow-up    6 month    HPI  85 y/o Caucasian female with hypertension, h/o CVA in 2017 with minimal left sided sensory deficit, hyperlipidemia, h/o hip replacement, h/o cholecystectomy 07/2017, was seen by me during her hospitalization in 01/2018 with tachycardia.   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 I offered the patient evaluation by EP and consideration for ablation. However, patient preferred medical management. I started the patient on sotalol 80 mg twice daily Now admitted with palpitations. Patient was subsequently in the emergency room complains of palpitations that had resolved. EKG showed normal sinus rhythm. She wore event monitor with no recurrence of SVT or any other arrhtymia seen. 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 Na improved to 133 on discharge.  Patient has occasional episodes of palpitations, but much improved than he hospital admission in 01/2018. Blood pressure is well controlled.     Current Outpatient Medications on File Prior to Visit  Medication Sig Dispense Refill  . acetaminophen (TYLENOL) 500 MG tablet Take 250 mg by mouth daily as needed for headache (pain).    . amLODipine (NORVASC) 10 MG tablet Take 1 tablet (10 mg total) by mouth daily. 90 tablet 3  . Calcium Carb-Cholecalciferol (CALCIUM 600+D3 PO) Take 1 tablet by mouth at bedtime.     . ciprofloxacin (CIPRO) 500 MG tablet Take 1 tablet (500 mg total) by mouth 2 (two) times daily. One po bid x 7 days 14 tablet 0  . latanoprost (XALATAN) 0.005 % ophthalmic solution Place 1 drop into both eyes at bedtime.    . lisinopril (ZESTRIL) 20 MG tablet TAKE 1  TABLET BY MOUTH EVERY DAY 90 tablet 1  . Multiple Vitamin (MULTIVITAMIN WITH MINERALS) TABS tablet Take 1 tablet by mouth daily with lunch. One a day Women's    . nitrofurantoin, macrocrystal-monohydrate, (MACROBID) 100 MG capsule Take 100 mg by mouth 2 (two) times daily.    . omeprazole (PRILOSEC) 40 MG capsule Take 40 mg by mouth daily.     . ondansetron (ZOFRAN) 4 MG tablet Take 4 mg by mouth every 8 (eight) hours as needed for nausea or vomiting.    . rosuvastatin (CRESTOR) 5 MG tablet Take 1 tablet (5 mg total) by mouth daily at 6 PM. (Patient taking differently: Take 5 mg by mouth daily. ) 30 tablet 1  . sertraline (ZOLOFT) 25 MG tablet Take 25 mg by mouth daily.     . sotalol (BETAPACE) 80 MG tablet Take 1 tablet (80 mg total) by mouth daily. 90 tablet 3  . traZODone (DESYREL) 50 MG tablet Take 25 mg by mouth at bedtime.      No current facility-administered medications on file prior to visit.    Cardiovascular studies:  EKG 04/24/2019: Sinus rhythm 68 bpm. Normal QTc interval. Normal EKG.  Event monitor 03/01/2018- 03/27/2018: No arrhtymias seen  Hospital echocardiogram 02/20/2018: Left ventricle: The cavity size was normal. Systolic function was vigorous. The estimated ejection fraction was in the range of 65% to 70%. Wall motion was normal; there were no regional wall motion abnormalities. Doppler parameters are consistent with abnormal left ventricular   relaxation (grade 1 diastolic dysfunction). Aortic valve: There was mild regurgitation. Pericardium, extracardiac: A small to moderate circumferential pericardial effusion without tamponade physiology. No significant change compared to previous study in 2017.  Carotid Doppler 2017:  - The vertebral arteries appear patent with antegrade flow. - Findings consistent with 1- 39 percent stenosis involving the   right internal carotid artery and the left internal carotid   artery. - Incidental finding: left thyroid cyst.  Recent  labs: 12/16/2018: Glucose 113, BUN/Cr 7/0.52. EGFR >60. Na/K 131/4.4.  H/H 13/38. MCV 87. Platelets 225 Chol 175, TG 171, HDL 36, LDL 105 TSH 0.7 normal Results for Younker, Toleen S (MRN [id number]) as of 04/24/2019 09:04  Ref. Range 12/16/2018 14:44 12/16/2018 21:24  Troponin I (High Sensitivity) Latest Ref Range: <18 ng/L 7 6    Review of Systems  Constitutional: Negative.   HENT: Negative for nosebleeds.   Eyes: Negative for blurred vision.  Gastrointestinal: Negative for abdominal pain, nausea and vomiting.  Genitourinary: Negative for dysuria.  Musculoskeletal: Negative for myalgias.  Skin: Negative for itching and rash.  Neurological: Negative for dizziness and loss of consciousness.  Endo/Heme/Allergies: Does not bruise/bleed easily.  Psychiatric/Behavioral: The patient is not nervous/anxious.   All other systems reviewed and are negative.       Objective:    Vitals:   04/24/19 1435  BP: (!) 137/53  Pulse: 67  Resp: 17  Temp: 98.8 F (37.1 C)  SpO2: 96%    Physical Exam  Constitutional: She appears well-developed and well-nourished.  Neck: No JVD present.  Cardiovascular: Normal rate, regular rhythm, normal heart sounds and intact distal pulses.  No murmur heard. Pulmonary/Chest: Effort normal and breath sounds normal. She has no wheezes. She has no rales.  Musculoskeletal:        General: No edema.  Nursing note and vitals reviewed.        Assessment & Recommendations:   85 y/o Caucasian female with hypertension, h/o CVA in 2017 with minimal left sided sensory deficit, hyperlipidemia, PSVT.  PSVT: No severe recurrence of SVT of Sotalol 80 mg daily. QTc is acceptable.   Hypertension: Well controled.   H/o CVA: No recent events.  F/u in 6 months.  Manish J Patwardhan, MD [facility] Pager: [phone] Office: [phone] If no answer Cell [phone] 

## 2019-04-25 ENCOUNTER — Encounter: Payer: Self-pay | Admitting: Cardiology

## 2019-07-19 IMAGING — DX DG HIP (WITH OR WITHOUT PELVIS) 2-3V*L*
3 series · 3 of 3 positions shown · non-contrast
Comparison: 09/29/2016

CLINICAL DATA: Pain. Left hip dislocation.

EXAM:
DG HIP (WITH OR WITHOUT PELVIS) 2-3V LEFT

[hip ap]
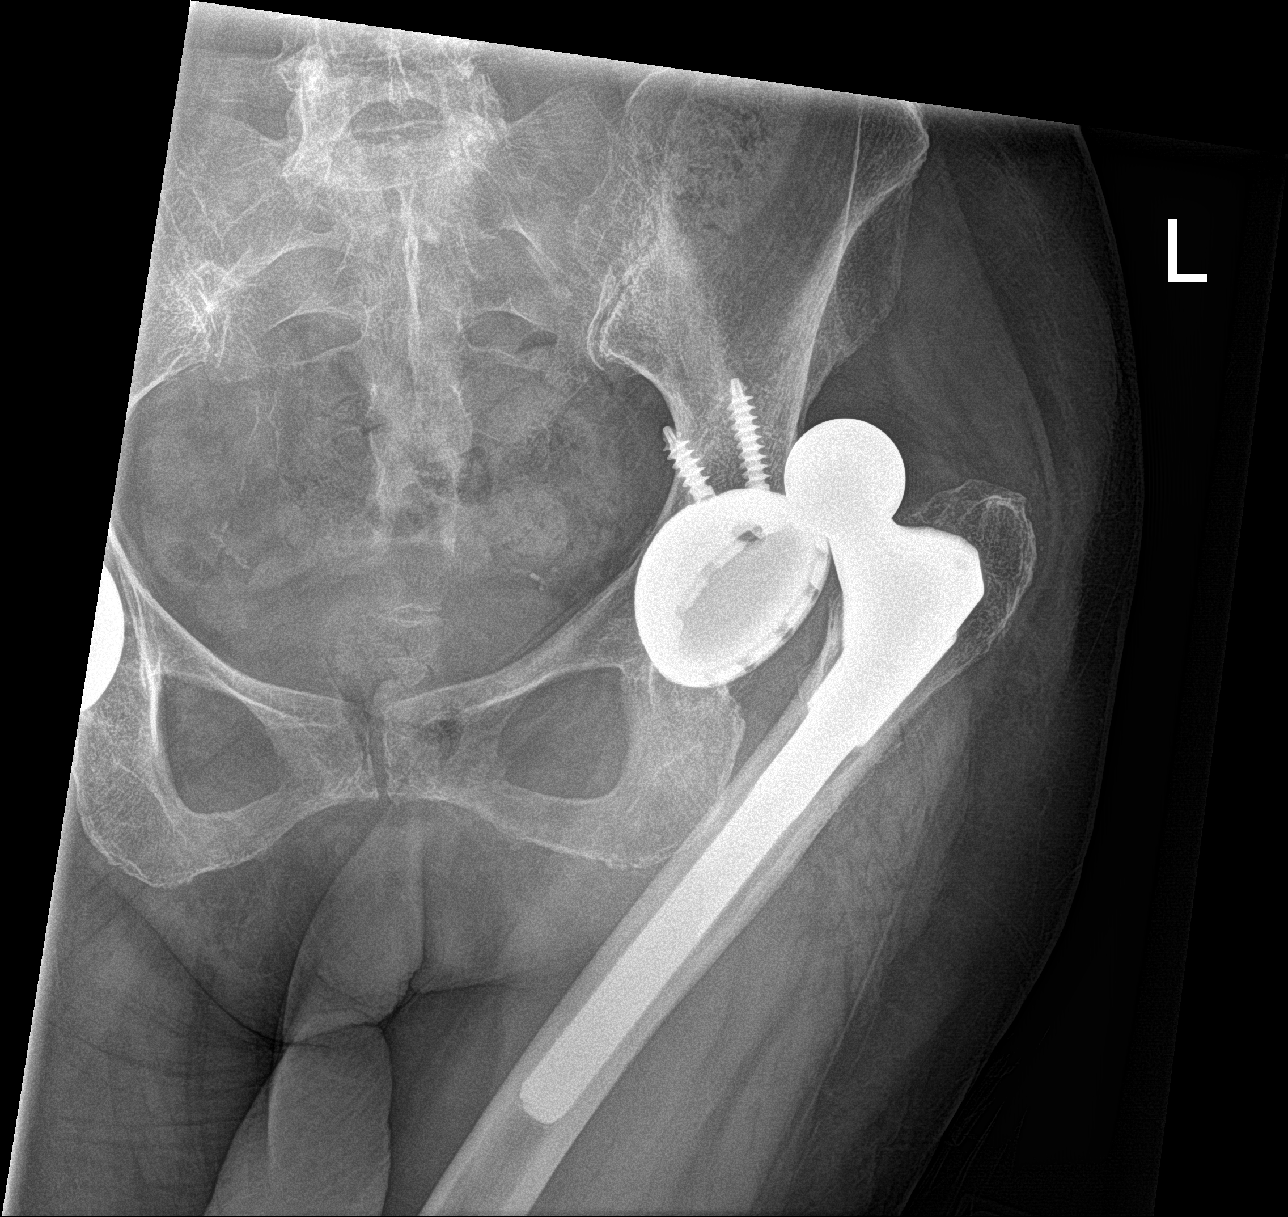

[hip lat]
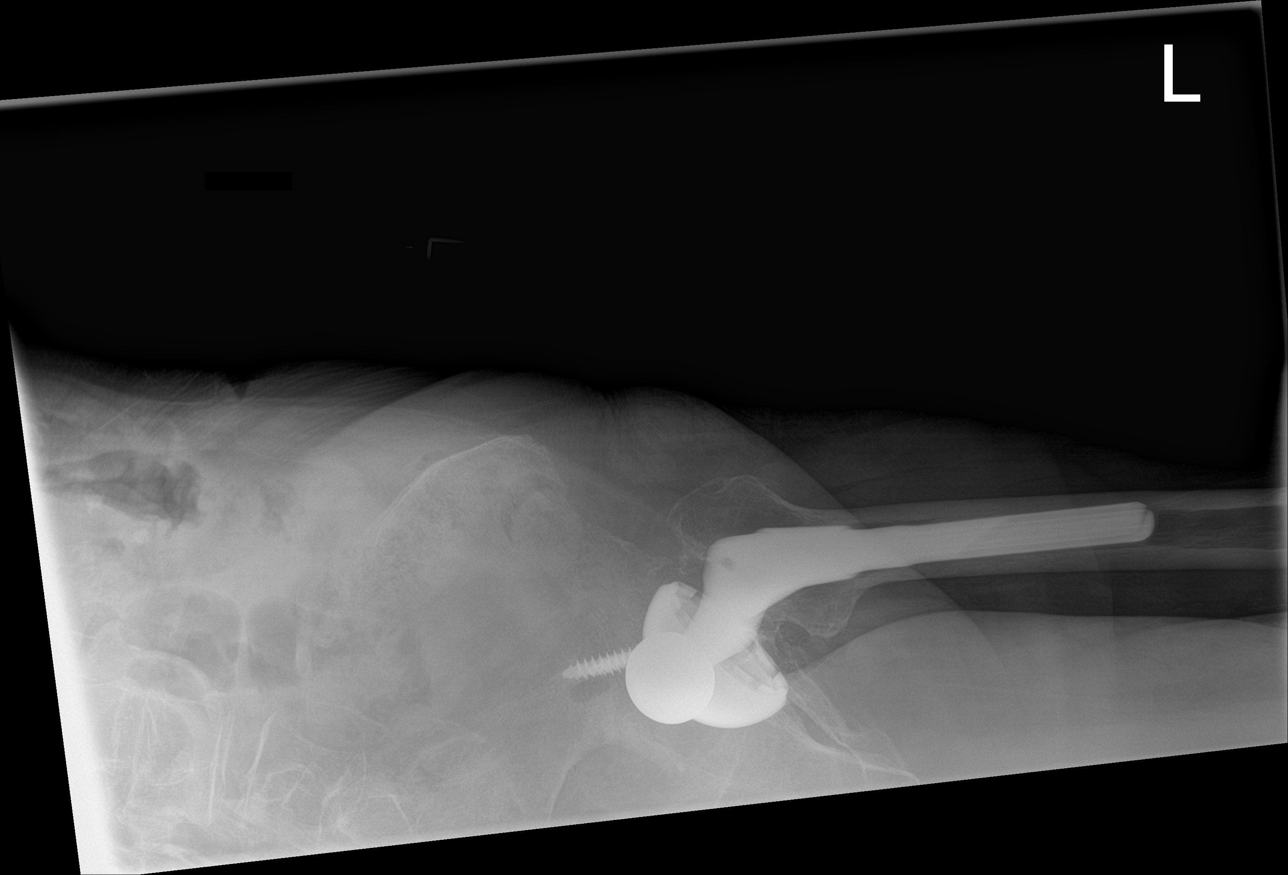

[pelvis ap]
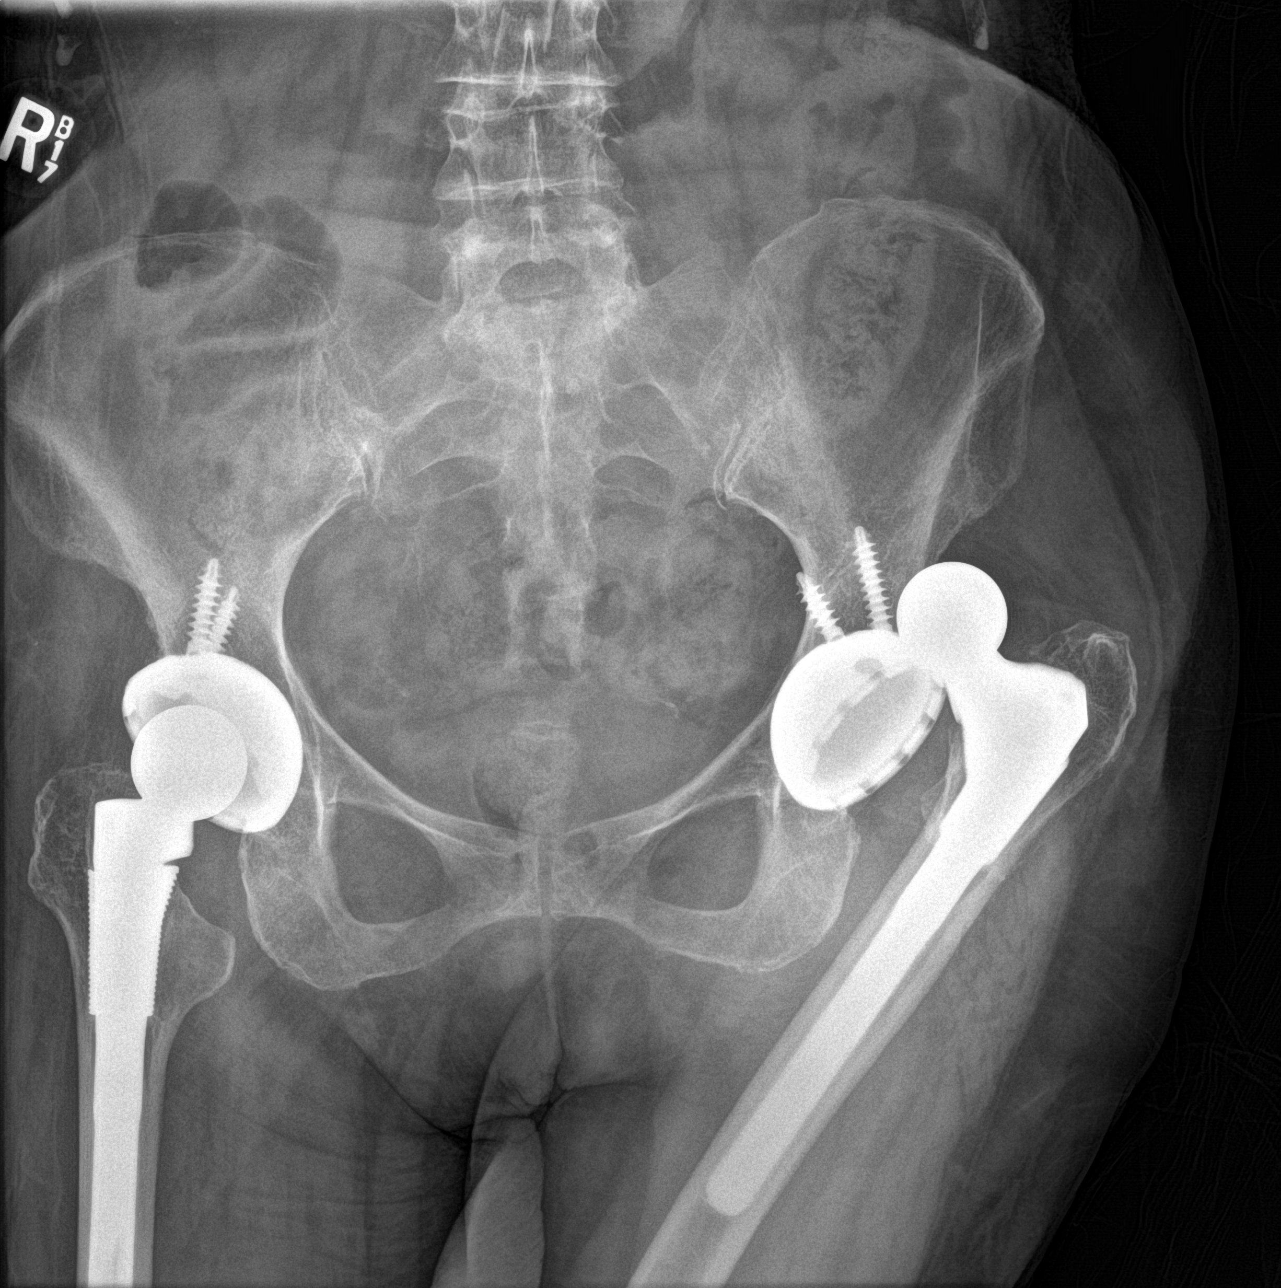

[3 of 3 positions shown; findings below may reference images not displayed]

FINDINGS: Bilateral total hip arthroplasties are again seen. The prosthetic
left femoral head is dislocated superiorly and laterally relative to
the acetabular cup. No acute fracture is identified. There is no
pelvic diastasis. The bones appear osteopenic.
IMPRESSION: Left hip arthroplasty dislocation.

## 2020-01-03 ENCOUNTER — Other Ambulatory Visit: Payer: Self-pay | Admitting: Cardiology

## 2020-01-03 DIAGNOSIS — I10 Essential (primary) hypertension: Secondary | ICD-10-CM

## 2020-04-20 ENCOUNTER — Other Ambulatory Visit: Payer: Self-pay | Admitting: Cardiology

## 2020-04-20 DIAGNOSIS — I471 Supraventricular tachycardia: Secondary | ICD-10-CM

## 2020-04-22 ENCOUNTER — Other Ambulatory Visit: Payer: Self-pay

## 2020-04-22 ENCOUNTER — Ambulatory Visit: Payer: Medicare Other | Admitting: Cardiology

## 2020-04-22 ENCOUNTER — Encounter: Payer: Self-pay | Admitting: Cardiology

## 2020-04-22 VITALS — BP 120/60 | HR 67 | Temp 98.0°F | Ht 60.0 in | Wt 119.0 lb

## 2020-04-22 DIAGNOSIS — I471 Supraventricular tachycardia: Secondary | ICD-10-CM

## 2020-04-22 DIAGNOSIS — R600 Localized edema: Secondary | ICD-10-CM

## 2020-04-22 DIAGNOSIS — E782 Mixed hyperlipidemia: Secondary | ICD-10-CM

## 2020-04-22 DIAGNOSIS — I10 Essential (primary) hypertension: Secondary | ICD-10-CM

## 2020-04-22 MED ORDER — AMLODIPINE BESYLATE 10 MG PO TABS: 5.0000 mg | ORAL_TABLET | Freq: Every day | ORAL | 0 refills | Status: DC

## 2020-04-22 MED ORDER — FUROSEMIDE 20 MG PO TABS: 20.0000 mg | ORAL_TABLET | Freq: Every day | ORAL | 3 refills | Status: DC

## 2020-04-22 NOTE — Progress Notes (Signed)
Patient is here for follow up visit.  Subjective:   @Patient  ID: [name], female    DOB: 1933/04/02, 87 y.o.   MRN: [id number]   Chief Complaint  Patient presents with  . paroxysmal supraventricular tachycardia    1 year   . Leg Swelling    HPI  87 y/o Caucasian female with hypertension, h/o CVA in 2017 with minimal left sided sensory deficit, hyperlipidemia, h/o hip replacement, h/o cholecystectomy 07/2017, was seen by me during her hospitalization in 01/2018 with tachycardia.   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 I offered the patient evaluation by EP and consideration for ablation. However, patient preferred medical management. I started the patient on sotalol 80 mg twice daily  Patient has tolerated this medication well with only occasional episodes of palpitations.  Recently, she has developed bilateral leg edema without overt shortness of breath.    Current Outpatient Medications on File Prior to Visit  Medication Sig Dispense Refill  . acetaminophen (TYLENOL) 500 MG tablet Take 250 mg by mouth daily as needed for headache (pain).    Marland Kitchen amLODipine (NORVASC) 10 MG tablet TAKE 1 TABLET BY MOUTH  DAILY 90 tablet 3  . Calcium Carb-Cholecalciferol (CALCIUM 600+D3 PO) Take 1 tablet by mouth at bedtime.     Marland Kitchen latanoprost (XALATAN) 0.005 % ophthalmic solution Place 1 drop into both eyes at bedtime.    Marland Kitchen lisinopril (ZESTRIL) 20 MG tablet TAKE 1 TABLET BY MOUTH EVERY DAY 90 tablet 1  . Multiple Vitamin (MULTIVITAMIN WITH MINERALS) TABS tablet Take 1 tablet by mouth daily with lunch. One a day Women's    . omeprazole (PRILOSEC) 40 MG capsule Take 40 mg by mouth daily.     . rosuvastatin (CRESTOR) 10 MG tablet Take 1 tablet (10 mg total) by mouth daily at 6 PM. 90 tablet 3  . sertraline (ZOLOFT) 25 MG tablet Take 25 mg by mouth daily.     . sotalol (BETAPACE) 80 MG tablet Take 1 tablet (80 mg total) by mouth  daily. 90 tablet 3  . traZODone (DESYREL) 50 MG tablet Take 25 mg by mouth at bedtime.      No current facility-administered medications on file prior to visit.    Cardiovascular studies:  EKG 04/22/2020: Sinus rhythm 61 bpm Nonspecific ST-T changes QTc within normal limits   Event monitor 03/01/2018- 03/27/2018: No arrhtymias seen  Hospital echocardiogram 02/20/2018: Left ventricle: The cavity size was normal. Systolic function was vigorous. The estimated ejection fraction was in the range of 65% to 70%. Wall motion was normal; there were no regional wall motion abnormalities. Doppler parameters are consistent with abnormal left ventricular relaxation (grade 1 diastolic dysfunction). Aortic valve: There was mild regurgitation. Pericardium, extracardiac: A small to moderate circumferential pericardial effusion without tamponade physiology. No significant change compared to previous study in 2017.  Carotid Doppler 2017:  - The vertebral arteries appear patent with antegrade flow. - Findings consistent with 1- 39 percent stenosis involving the   right internal carotid artery and the left internal carotid   artery. - Incidental finding: left thyroid cyst.  Recent labs: 12/16/2018: Glucose 113, BUN/Cr 7/0.52. EGFR >60. Na/K 131/4.4.  H/H 13/38. MCV 87. Platelets 225 Chol 175, TG 171, HDL 36, LDL 105 TSH 0.7 normal Results for Kristen, [name] (MRN [id number]) as of 04/24/2019 09:04  Ref. Range 12/16/2018 14:44 12/16/2018 21:24  Troponin I (High Sensitivity) Latest Ref Range: <18  ng/L 7 6    Review of Systems  Constitutional: Negative.   HENT: Negative for nosebleeds.   Eyes: Negative for blurred vision.  Gastrointestinal: Negative for abdominal pain, nausea and vomiting.  Genitourinary: Negative for dysuria.  Musculoskeletal: Negative for myalgias.  Skin: Negative for itching and rash.  Neurological: Negative for dizziness and loss of consciousness.  Endo/Heme/Allergies: Does not  bruise/bleed easily.  Psychiatric/Behavioral: The patient is not nervous/anxious.   All other systems reviewed and are negative.       Objective:    Vitals:   04/22/20 1425  BP: 120/60  Pulse: 67  Temp: 98 F (36.7 C)  SpO2: 97%    Physical Exam Vitals and nursing note reviewed.  Constitutional:      Appearance: She is well-developed.  Neck:     Vascular: No JVD.  Cardiovascular:     Rate and Rhythm: Normal rate and regular rhythm.     Pulses: Intact distal pulses.     Heart sounds: Normal heart sounds. No murmur heard.   Pulmonary:     Effort: Pulmonary effort is normal.     Breath sounds: Normal breath sounds. No wheezing or rales.          Assessment & Recommendations:   87 y/o Caucasian female with hypertension, h/o CVA in 2017 with minimal left sided sensory deficit, hyperlipidemia, PSVT.  PSVT: Only occasional symptoms on Sotalol 80 mg daily. QTc is acceptable.  To avoid QTC prolongation, have discontinued her trazodone. We will place on cardiac telemetry to rule out any other arrhythmias, including A. fib.  Leg edema: Reduce amlodipine to 5 mg daily.  Started Lasix 20 mg daily. We will check echocardiogram.  Hypertension: Well controled.   H/o CVA: No recent events.  F/u in 4-6 weeks  Manish Esther Hardy, MD [facility] Pager: [phone] Office: [phone] If no answer Cell [phone]

## 2020-04-24 ENCOUNTER — Encounter: Payer: Self-pay | Admitting: Cardiology

## 2020-04-24 DIAGNOSIS — R600 Localized edema: Secondary | ICD-10-CM | POA: Insufficient documentation

## 2020-04-28 ENCOUNTER — Other Ambulatory Visit: Payer: Self-pay | Admitting: Cardiology

## 2020-04-28 DIAGNOSIS — E782 Mixed hyperlipidemia: Secondary | ICD-10-CM

## 2020-05-05 ENCOUNTER — Inpatient Hospital Stay: Payer: Medicare Other

## 2020-05-05 ENCOUNTER — Other Ambulatory Visit: Payer: Self-pay

## 2020-05-05 ENCOUNTER — Ambulatory Visit: Payer: Medicare Other

## 2020-05-05 DIAGNOSIS — I10 Essential (primary) hypertension: Secondary | ICD-10-CM

## 2020-05-05 DIAGNOSIS — R600 Localized edema: Secondary | ICD-10-CM

## 2020-05-05 DIAGNOSIS — I471 Supraventricular tachycardia: Secondary | ICD-10-CM

## 2020-06-07 ENCOUNTER — Other Ambulatory Visit: Payer: Self-pay

## 2020-06-07 ENCOUNTER — Encounter: Payer: Self-pay | Admitting: Cardiology

## 2020-06-07 ENCOUNTER — Ambulatory Visit: Payer: Medicare Other | Admitting: Cardiology

## 2020-06-07 VITALS — BP 151/58 | HR 76 | Temp 98.3°F | Resp 16 | Ht 60.0 in | Wt 121.2 lb

## 2020-06-07 DIAGNOSIS — I10 Essential (primary) hypertension: Secondary | ICD-10-CM

## 2020-06-07 DIAGNOSIS — I471 Supraventricular tachycardia: Secondary | ICD-10-CM

## 2020-06-07 DIAGNOSIS — E782 Mixed hyperlipidemia: Secondary | ICD-10-CM

## 2020-06-07 MED ORDER — METOPROLOL SUCCINATE ER 50 MG PO TB24: 50.0000 mg | ORAL_TABLET | Freq: Every day | ORAL | 3 refills | Status: DC

## 2020-06-07 NOTE — Progress Notes (Signed)
Patient is here for follow up visit.  Subjective:   @Patient  ID: [name], female    DOB: 1933/05/29, 87 y.o.   MRN: [id number]   Chief Complaint  Patient presents with  . paroxysmal supraventricular tachycardia  . Follow-up  . Results    HPI  87 y/o Caucasian female with hypertension, h/o CVA in 2017 with minimal left sided sensory deficit, hyperlipidemia, h/o hip replacement, h/o cholecystectomy 07/2017, was seen by me during her hospitalization in 01/2018 with tachycardia.   Reviewed recent cardiac telemetry and echocardiogram results with the patient, details below. Leg edema  has improved after reducing dose of amlodipine.  Current Outpatient Medications on File Prior to Visit  Medication Sig Dispense Refill  . acetaminophen (TYLENOL) 500 MG tablet Take 250 mg by mouth daily as needed for headache (pain).    Marland Kitchen amLODipine (NORVASC) 10 MG tablet Take 0.5 tablets (5 mg total) by mouth daily. 1 tablet 0  . Calcium Carb-Cholecalciferol (CALCIUM 600+D3 PO) Take 1 tablet by mouth at bedtime.     . furosemide (LASIX) 20 MG tablet Take 1 tablet (20 mg total) by mouth daily. 90 tablet 3  . latanoprost (XALATAN) 0.005 % ophthalmic solution Place 1 drop into both eyes at bedtime.    Marland Kitchen lisinopril (ZESTRIL) 20 MG tablet TAKE 1 TABLET BY MOUTH EVERY DAY 90 tablet 1  . Multiple Vitamin (MULTIVITAMIN WITH MINERALS) TABS tablet Take 1 tablet by mouth daily with lunch. One a day Women's    . omeprazole (PRILOSEC) 40 MG capsule Take 40 mg by mouth daily.     . rosuvastatin (CRESTOR) 10 MG tablet TAKE 1 TABLET BY MOUTH  DAILY AT 6 PM 90 tablet 3  . sertraline (ZOLOFT) 25 MG tablet Take 25 mg by mouth daily.     . sotalol (BETAPACE) 80 MG tablet TAKE 1 TABLET BY MOUTH  DAILY 90 tablet 3   No current facility-administered medications on file prior to visit.    Cardiovascular studies:  Mobile cardiac telemetry 14 days 05/05/2020 - 05/19/2020: Dominant rhythm: Sinus. HR 52-125 bpm. Avg HR  66 bpm, in sinus rhythm. 49 episodes of SVT, fastest at 171 bpm for 11 beats, longest for 12.4 at 124 bpm. 2.4% isolated SVE, <1% couplet/triplets. <1% isolated VE, no couplet/triplets. No atrial fibrillation/atrial flutter/VT/high grade AV block, sinus pause >3sec noted. 0 patient triggered events.   Echocardiogram 05/05/2020:  Normal LV systolic function with visual EF 55-60%. Left ventricle cavity  is normal in size. Normal global wall motion. Indeterminate diastolic  filling pattern, elevated LAP.  Aortic valve sclerosis without stenosis. Mild (Grade I) aortic  regurgitation.  Mild tricuspid regurgitation. No evidence of pulmonary hypertension.  Moderate circumferential pericardial effusion without hemodynamic  significance.  Prior study dated 02/20/2018: LVEF 65-70%, no RWMA, G1DD, mild AR, small  to moderate circumferentialpericardial effusion without tamponade  physiology.  EKG 04/22/2020: Sinus rhythm 61 bpm Nonspecific ST-T changes QTc within normal limits   Event monitor 03/01/2018- 03/27/2018: No arrhtymias seen  Hospital echocardiogram 02/20/2018: Left ventricle: The cavity size was normal. Systolic function was vigorous. The estimated ejection fraction was in the range of 65% to 70%. Wall motion was normal; there were no regional wall motion abnormalities. Doppler parameters are consistent with abnormal left ventricular relaxation (grade 1 diastolic dysfunction). Aortic valve: There was mild regurgitation. Pericardium, extracardiac: A small to moderate circumferential pericardial effusion without tamponade physiology. No significant change compared to previous study in 2017.  Carotid Doppler 2017:  -  The vertebral arteries appear patent with antegrade flow. - Findings consistent with 1- 39 percent stenosis involving the   right internal carotid artery and the left internal carotid   artery. - Incidental finding: left thyroid cyst.  Recent labs: 12/16/2018: Glucose  113, BUN/Cr 7/0.52. EGFR >60. Na/K 131/4.4.  H/H 13/38. MCV 87. Platelets 225 Chol 175, TG 171, HDL 36, LDL 105 TSH 0.7 normal Results for TATEM, HOLSONBACK (MRN [id number]) as of 04/24/2019 09:04  Ref. Range 12/16/2018 14:44 12/16/2018 21:24  Troponin I (High Sensitivity) Latest Ref Range: <18 ng/L 7 6    Review of Systems  Constitutional: Negative.   HENT: Negative for nosebleeds.   Eyes: Negative for blurred vision.  Gastrointestinal: Negative for abdominal pain, nausea and vomiting.  Genitourinary: Negative for dysuria.  Musculoskeletal: Negative for myalgias.  Skin: Negative for itching and rash.  Neurological: Negative for dizziness and loss of consciousness.  Endo/Heme/Allergies: Does not bruise/bleed easily.  Psychiatric/Behavioral: The patient is not nervous/anxious.   All other systems reviewed and are negative.       Objective:    Vitals:   06/07/20 1503  BP: (!) 151/58  Pulse: 76  Resp: 16  Temp: 98.3 F (36.8 C)  SpO2: 97%    Physical Exam Vitals and nursing note reviewed.  Constitutional:      Appearance: She is well-developed.  Neck:     Vascular: No JVD.  Cardiovascular:     Rate and Rhythm: Normal rate and regular rhythm.     Pulses: Intact distal pulses.     Heart sounds: Normal heart sounds. No murmur heard.   Pulmonary:     Effort: Pulmonary effort is normal.     Breath sounds: Normal breath sounds. No wheezing or rales.          Assessment & Recommendations:   87 y/o Caucasian female with hypertension, h/o CVA in 2017 with minimal left sided sensory deficit, hyperlipidemia, PSVT.  PSVT: 2% supraventricular ectopy burden on sotalol 80 mg daily. Given her advanced age, I will switch her from sotalol to metoprolol succinate 50 mg daily.  Mild AI, moderate pericardial effusion: Asymptomatic, unchanged. Monitor for now.   Hypertension: Well controled.   H/o CVA: No recent events.  F/u in 3 months  [place],  MD [facility] Pager: [phone] Office: [phone] If no answer Cell [phone]

## 2020-06-15 ENCOUNTER — Telehealth: Payer: Self-pay

## 2020-06-15 NOTE — Telephone Encounter (Signed)
Attempted to call pt, number did not go through. Will try again later.

## 2020-06-15 NOTE — Telephone Encounter (Signed)
Sotalol should be stopped.  Thanks MJP

## 2020-06-15 NOTE — Telephone Encounter (Signed)
Pt called regarding metoprolol. Pt would like to know if she is suppose to be stopping any other medication since starting this one.

## 2020-06-16 NOTE — Telephone Encounter (Signed)
Called pt to inform her to stopped the sotalol. Pt understand

## 2020-08-03 IMAGING — CR DG CHEST 2V
2 series · 2 of 2 positions shown · non-contrast
Comparison: 08/24/2018

CLINICAL DATA: Palpitation

EXAM:
CHEST - 2 VIEW

[chest pa]
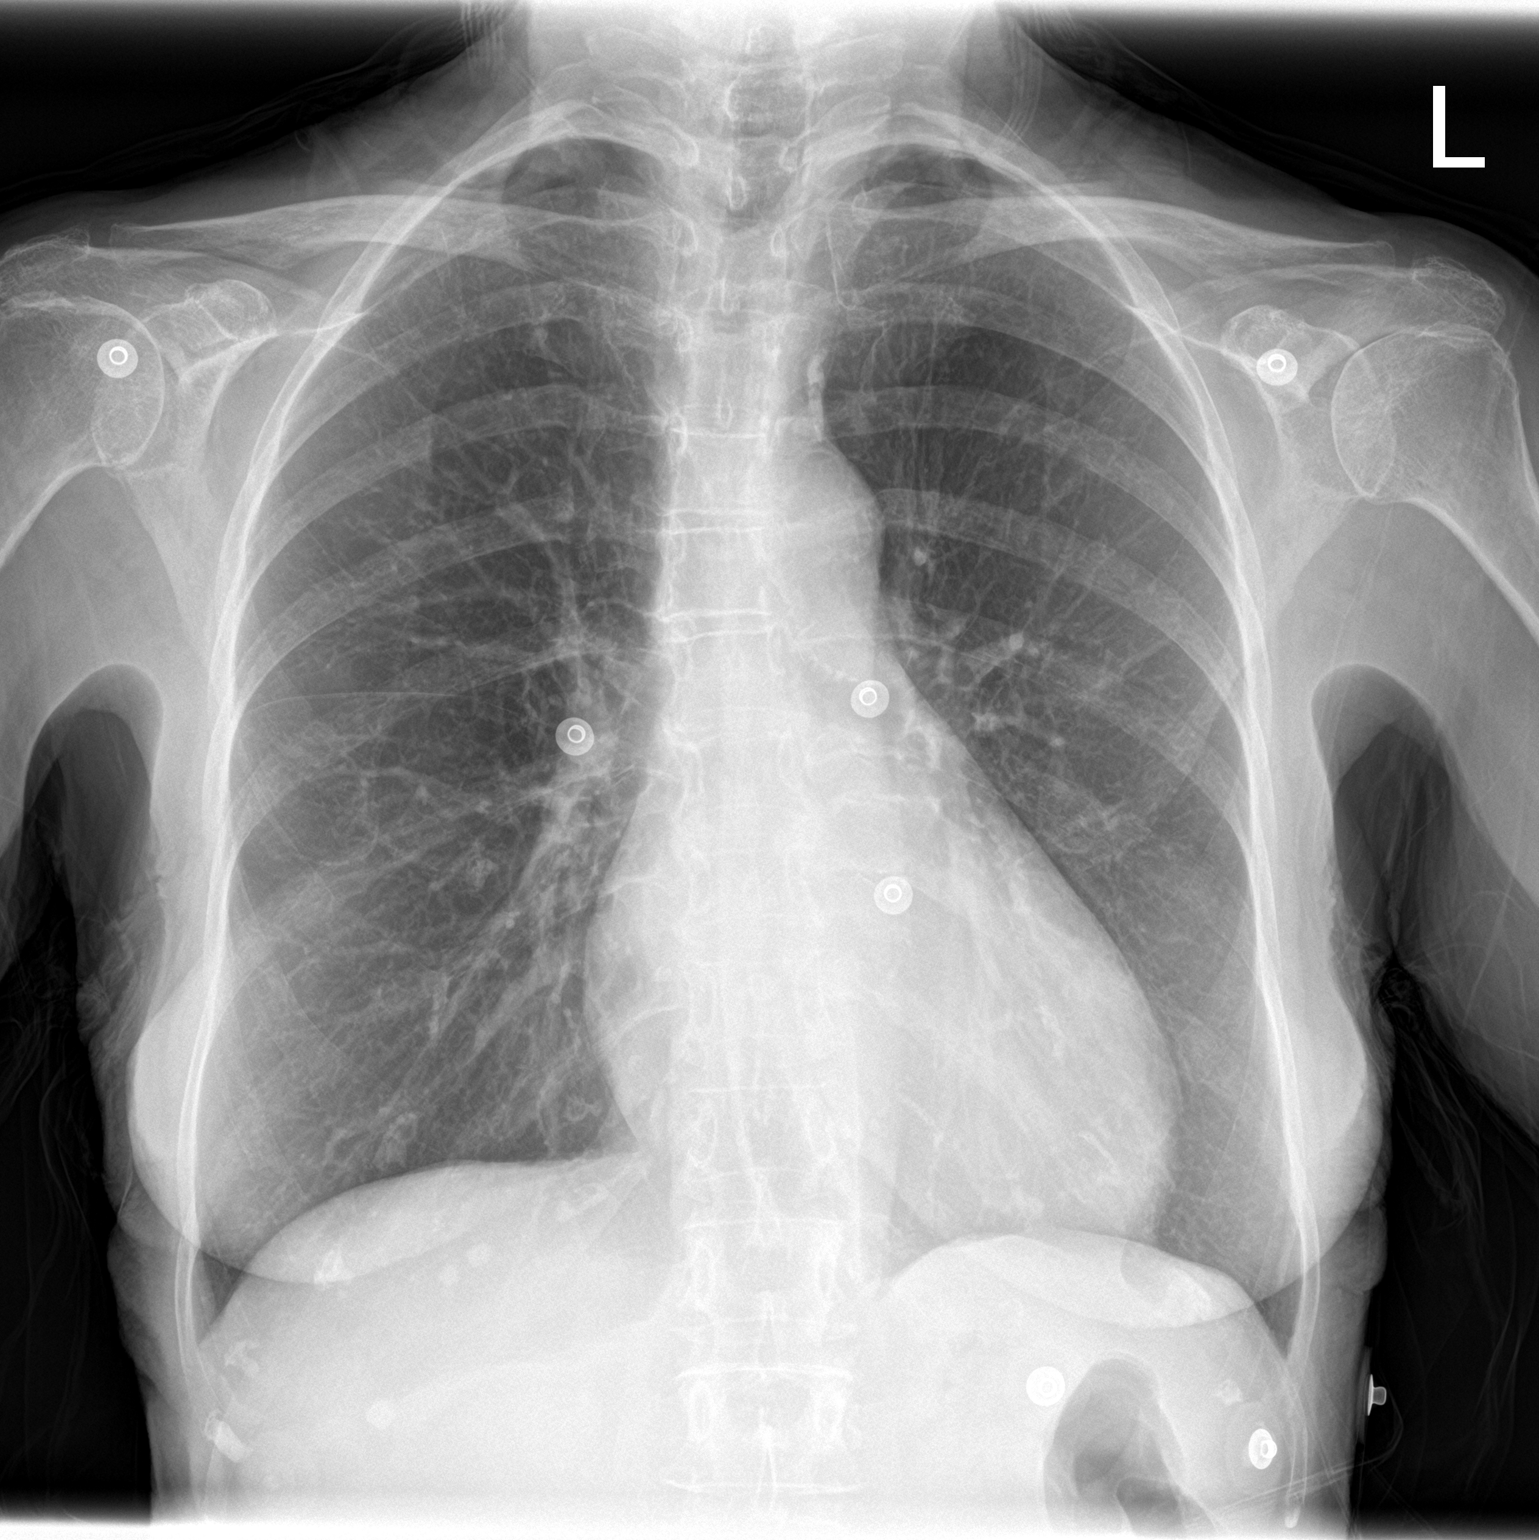

[chest lat]
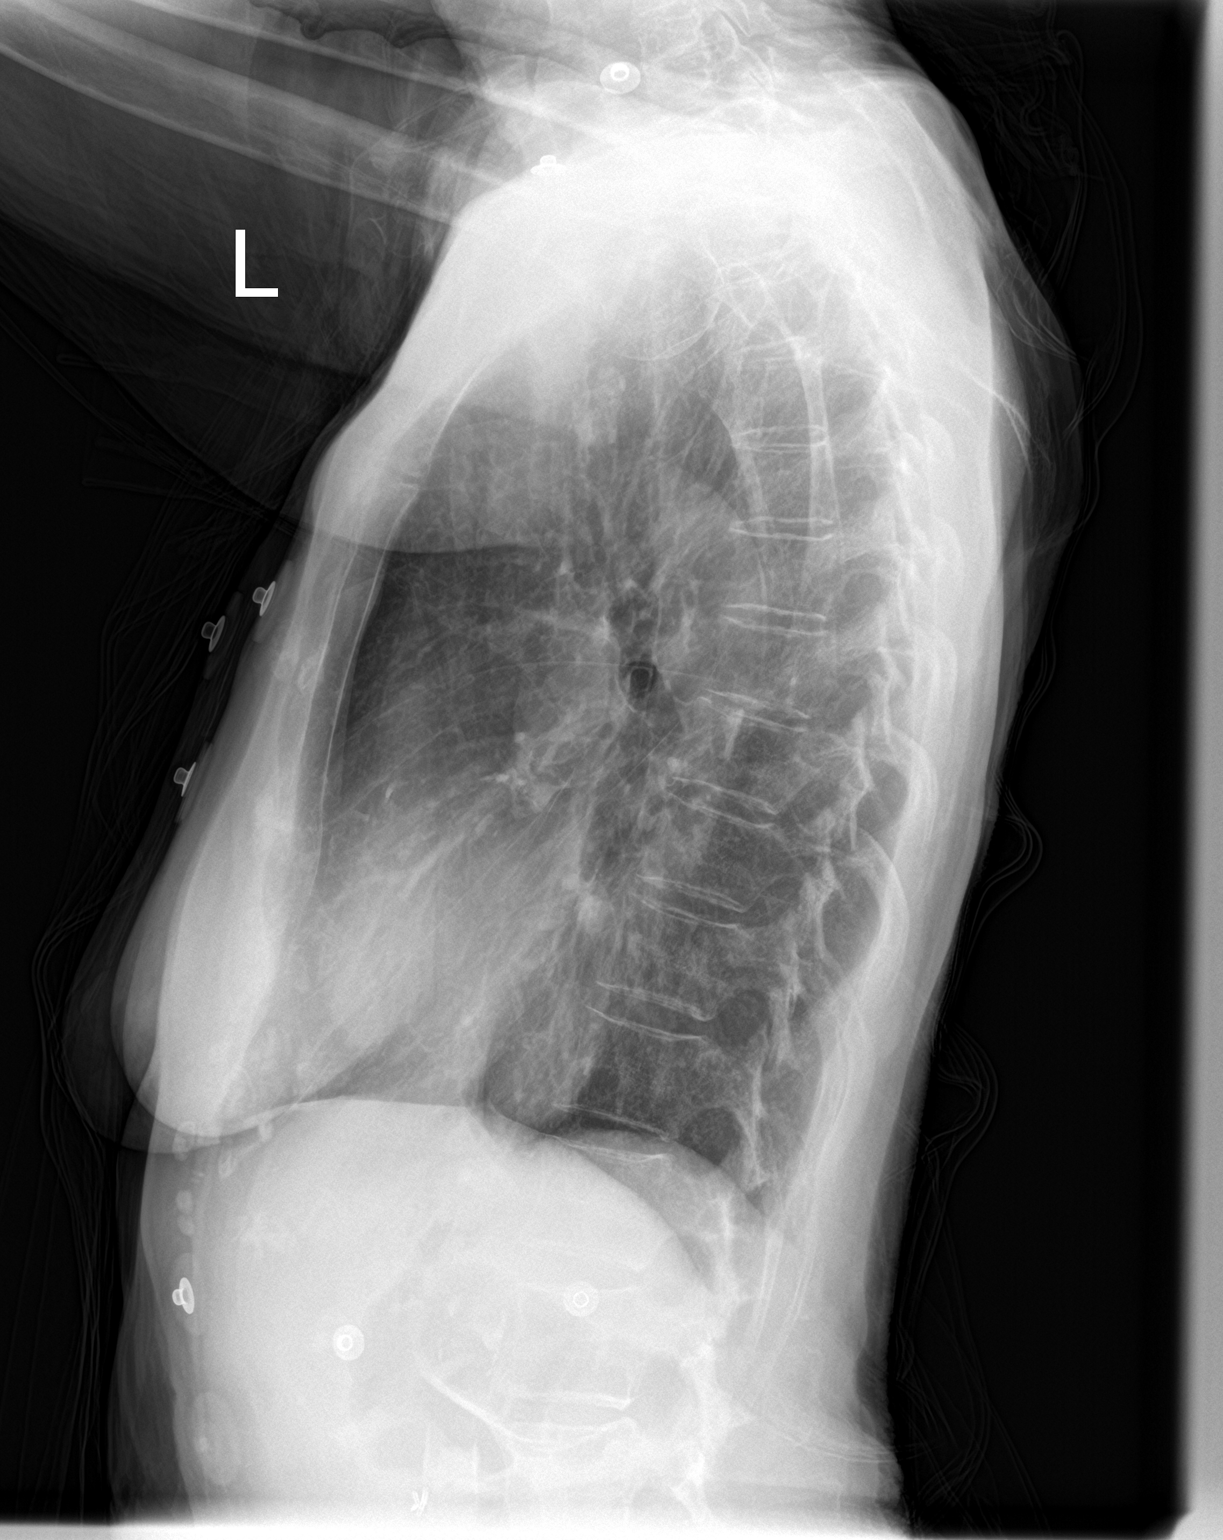

[2 of 2 positions shown; findings below may reference images not displayed]

FINDINGS: The heart size and mediastinal contours are within normal limits.
Both lungs are clear. The visualized skeletal structures are
unremarkable.
IMPRESSION: No active cardiopulmonary disease.

## 2020-09-08 ENCOUNTER — Encounter: Payer: Self-pay | Admitting: Cardiology

## 2020-09-08 ENCOUNTER — Ambulatory Visit: Payer: Medicare Other | Admitting: Cardiology

## 2020-09-08 ENCOUNTER — Other Ambulatory Visit: Payer: Self-pay

## 2020-09-08 VITALS — BP 145/51 | HR 71 | Temp 98.0°F | Resp 16 | Ht 60.0 in | Wt 117.0 lb

## 2020-09-08 DIAGNOSIS — I471 Supraventricular tachycardia: Secondary | ICD-10-CM

## 2020-09-08 DIAGNOSIS — I10 Essential (primary) hypertension: Secondary | ICD-10-CM

## 2020-09-08 DIAGNOSIS — I3139 Other pericardial effusion (noninflammatory): Secondary | ICD-10-CM | POA: Insufficient documentation

## 2020-09-08 DIAGNOSIS — I313 Pericardial effusion (noninflammatory): Secondary | ICD-10-CM

## 2020-09-08 MED ORDER — SOTALOL HCL 80 MG PO TABS: 80.0000 mg | ORAL_TABLET | Freq: Every day | ORAL | 3 refills | Status: DC

## 2020-09-08 NOTE — Progress Notes (Signed)
Patient is here for follow up visit.  Subjective:   _0  ID: [name], female    DOB: Nov 22, 1933, 87 y.o.   MRN: [id number]   Chief Complaint  Patient presents with   PSVT (paroxysmal supraventricular tachycardia   Follow-up    3 month    HPI  87 y/o Caucasian female with hypertension, h/o CVA in 2017 with minimal left sided sensory deficit, hyperlipidemia, PSVT.  Given her age, I had stopped sotalol and changed to metoprolol. However, she stopped taking metoprolol due to dizziness and is now back on sotalol. EKG reviwewed today. Blood pressure elevated today, usually normal.   Current Outpatient Medications on File Prior to Visit  Medication Sig Dispense Refill   acetaminophen (TYLENOL) 500 MG tablet Take 250 mg by mouth daily as needed for headache (pain).     amLODipine (NORVASC) 10 MG tablet Take 0.5 tablets (5 mg total) by mouth daily. 1 tablet 0   Calcium Carb-Cholecalciferol (CALCIUM 600+D3 PO) Take 1 tablet by mouth at bedtime.      furosemide (LASIX) 20 MG tablet Take 1 tablet (20 mg total) by mouth daily. 90 tablet 3   latanoprost (XALATAN) 0.005 % ophthalmic solution Place 1 drop into both eyes at bedtime.     lisinopril (ZESTRIL) 20 MG tablet TAKE 1 TABLET BY MOUTH EVERY DAY 90 tablet 1   metoprolol succinate (TOPROL-XL) 50 MG 24 hr tablet Take 1 tablet (50 mg total) by mouth daily. Take with or immediately following a meal. 30 tablet 3   Multiple Vitamin (MULTIVITAMIN WITH MINERALS) TABS tablet Take 1 tablet by mouth daily with lunch. One a day Women's     omeprazole (PRILOSEC) 40 MG capsule Take 40 mg by mouth daily.      rosuvastatin (CRESTOR) 10 MG tablet TAKE 1 TABLET BY MOUTH  DAILY AT 6 PM 90 tablet 3   No current facility-administered medications on file prior to visit.    Cardiovascular studies:  EKG 09/08/2020: Probable sinus rhythm 64 bpm Low voltage in precordial leads QTc interval 450 msec  Mobile cardiac telemetry 14 days 05/05/2020 -  05/19/2020: Dominant rhythm: Sinus. HR 52-125 bpm. Avg HR 66 bpm, in sinus rhythm. 49 episodes of SVT, fastest at 171 bpm for 11 beats, longest for 12.4 at 124 bpm. 2.4% isolated SVE, <1% couplet/triplets. <1% isolated VE, no couplet/triplets. No atrial fibrillation/atrial flutter/VT/high grade AV block, sinus pause >3sec noted. 0 patient triggered events.   Echocardiogram 05/05/2020:  Normal LV systolic function with visual EF 55-60%. Left ventricle cavity  is normal in size. Normal global wall motion. Indeterminate diastolic  filling pattern, elevated LAP.  Aortic valve sclerosis without stenosis. Mild (Grade I) aortic  regurgitation.  Mild tricuspid regurgitation. No evidence of pulmonary hypertension.  Moderate circumferential pericardial effusion without hemodynamic  significance.  Prior study dated 02/20/2018: LVEF 65-70%, no RWMA, G1DD, mild AR, small  to moderate circumferential pericardial effusion without tamponade  physiology.  EKG 04/22/2020: Sinus rhythm 61 bpm Nonspecific ST-T changes QTc within normal limits   Event monitor 03/01/2018- 03/27/2018: No arrhtymias seen  Hospital echocardiogram 02/20/2018: Left ventricle: The cavity size was normal. Systolic function was vigorous. The estimated ejection fraction was in the range of 65% to 70%. Wall motion was normal; there were no regional wall motion abnormalities. Doppler parameters are consistent with abnormal left ventricular relaxation (grade 1 diastolic dysfunction). Aortic valve: There was mild regurgitation. Pericardium, extracardiac: A small to moderate circumferential pericardial effusion without tamponade physiology. No significant  change compared to previous study in 2017.  Carotid Doppler 2017:  - The vertebral arteries appear patent with antegrade flow. - Findings consistent with 1- 39 percent stenosis involving the   right internal carotid artery and the left internal carotid   artery. - Incidental finding:  left thyroid cyst.   Recent labs: 04/28/2020: Glucose 106, BUN/Cr 13/0.85. EGFR 68. Na/K 134/4.7. T.bili 1.3. Rest of the CMP normal H/H 14/40. MCV 88. Platelets 199 HbA1C N/A Chol 181, TG 114, HDL 79, LDL 86 TSH N/A  12/16/2018: Glucose 113, BUN/Cr 7/0.52. EGFR >60. Na/K 131/4.4.  H/H 13/38. MCV 87. Platelets 225 Chol 175, TG 171, HDL 36, LDL 105 TSH 0.7 normal Results for VALMAI, VANDENBERGHE (MRN [id number]) as of 04/24/2019 09:04  Ref. Range 12/16/2018 14:44 12/16/2018 21:24  Troponin I (High Sensitivity) Latest Ref Range: <18 ng/L 7 6    Review of Systems  Constitutional: Negative.   HENT:  Negative for nosebleeds.   Eyes:  Negative for blurred vision.  Gastrointestinal:  Negative for abdominal pain, nausea and vomiting.  Genitourinary:  Negative for dysuria.  Musculoskeletal:  Negative for myalgias.  Skin:  Negative for itching and rash.  Neurological:  Negative for dizziness and loss of consciousness.  Endo/Heme/Allergies:  Does not bruise/bleed easily.  Psychiatric/Behavioral:  The patient is not nervous/anxious.   All other systems reviewed and are negative.      Objective:    Vitals:   09/08/20 1428  BP: (!) 145/51  Pulse: 71  Resp: 16  Temp: 98 F (36.7 C)  SpO2: 96%    Physical Exam Vitals and nursing note reviewed.  Constitutional:      Appearance: She is well-developed.  Neck:     Vascular: No JVD.  Cardiovascular:     Rate and Rhythm: Normal rate and regular rhythm.     Pulses: Intact distal pulses.     Heart sounds: Normal heart sounds. No murmur heard. Pulmonary:     Effort: Pulmonary effort is normal.     Breath sounds: Normal breath sounds. No wheezing or rales.         Assessment & Recommendations:   87 y/o Caucasian female with hypertension, h/o CVA in 2017 with minimal left sided sensory deficit, hyperlipidemia, PSVT.  PSVT: Symptoms controlled on sotalol 80 mg daily. She prefers this instead of metoprolol due to dizziness side  effects. EKG shows QTc within normal limits.  Mild AI, moderate pericardial effusion: Asymptomatic, unchanged. Monitor for now.  Repeat echocardiogram in 6 months  Hypertension: BP elevated today. Arranged for remote patient monitoring through pur pharmacist Manuela Schwartz. If SBP remains >140 mmhg, ill increase lisinopril to 40 mg daily.   H/o CVA: No recent events. Continue Aspirin 81 mg.   F/u in 3 months  Ary Rudnick Esther Hardy, MD [facility] Pager: [phone] Office: [phone] If no answer Cell [phone]

## 2020-11-22 ENCOUNTER — Other Ambulatory Visit: Payer: Self-pay | Admitting: Cardiology

## 2020-11-22 DIAGNOSIS — I10 Essential (primary) hypertension: Secondary | ICD-10-CM

## 2021-02-24 ENCOUNTER — Ambulatory Visit: Payer: Medicare Other

## 2021-02-24 ENCOUNTER — Other Ambulatory Visit: Payer: Self-pay

## 2021-02-24 DIAGNOSIS — I3139 Other pericardial effusion (noninflammatory): Secondary | ICD-10-CM

## 2021-03-11 ENCOUNTER — Encounter: Payer: Self-pay | Admitting: Cardiology

## 2021-03-11 ENCOUNTER — Other Ambulatory Visit: Payer: Self-pay

## 2021-03-11 ENCOUNTER — Ambulatory Visit: Payer: Medicare Other | Admitting: Cardiology

## 2021-03-11 VITALS — BP 144/57 | HR 60 | Temp 97.9°F | Ht 60.0 in | Wt 125.0 lb

## 2021-03-11 DIAGNOSIS — I10 Essential (primary) hypertension: Secondary | ICD-10-CM

## 2021-03-11 DIAGNOSIS — I471 Supraventricular tachycardia: Secondary | ICD-10-CM

## 2021-03-11 MED ORDER — SOTALOL HCL 80 MG PO TABS: 40.0000 mg | ORAL_TABLET | Freq: Every day | ORAL | 3 refills | Status: DC

## 2021-03-11 NOTE — Progress Notes (Signed)
° ° °Patient is here for follow up visit. ° °Subjective:  ° °@Patient ID: [name], female    DOB: 08/14/1933, 87 y.o.   MRN: [id number] ° ° °Chief Complaint  °Patient presents with  ° psvt  ° Follow-up  ° Results  ° ° °HPI ° °87 y/o Caucasian female with hypertension, h/o CVA in 2017 with minimal left sided sensory deficit, hyperlipidemia, PSVT. ° °Patient is doing well. BP is well controlled at home. Palpitations are well controlled. She has upcoming labs with PCP in spring. ° °Current Outpatient Medications on File Prior to Visit  °Medication Sig Dispense Refill  ° acetaminophen (TYLENOL) 500 MG tablet Take 250 mg by mouth daily as needed for headache (pain).    ° amLODipine (NORVASC) 10 MG tablet TAKE 1 TABLET BY MOUTH  DAILY 90 tablet 3  ° aspirin EC 81 MG tablet Take 81 mg by mouth daily. Swallow whole.    ° Calcium Carb-Cholecalciferol (CALCIUM 600+D3 PO) Take 1 tablet by mouth at bedtime.     ° latanoprost (XALATAN) 0.005 % ophthalmic solution Place 1 drop into both eyes at bedtime.    ° lisinopril (ZESTRIL) 20 MG tablet TAKE 1 TABLET BY MOUTH EVERY DAY 90 tablet 1  ° Multiple Vitamin (MULTIVITAMIN WITH MINERALS) TABS tablet Take 1 tablet by mouth daily with lunch. One a day Women's    ° omeprazole (PRILOSEC) 40 MG capsule Take 40 mg by mouth daily.     ° rosuvastatin (CRESTOR) 10 MG tablet TAKE 1 TABLET BY MOUTH  DAILY AT 6 PM 90 tablet 3  ° sertraline (ZOLOFT) 25 MG tablet Take 1 tablet by mouth once.    ° sotalol (BETAPACE) 80 MG tablet Take 1 tablet (80 mg total) by mouth daily. 90 tablet 3  ° °No current facility-administered medications on file prior to visit.  ° ° °Cardiovascular studies: ° °EKG 03/11/2021: °Sinus rhythm 60 bpm °Qtc 456 msec, upper limit normal ° °Echocardiogram 02/24/2021:  °Normal LV systolic function with visual EF 60-65%. Left ventricle cavity  °is normal in size. Normal left ventricular wall thickness. Normal global  °wall motion. Indeterminate diastolic filling pattern,  elevated LAP.  °Aortic valve sclerosis without stenosis. Mild (Grade I) aortic  °regurgitation.  °Mild (Grade I) mitral regurgitation.  °Mild pulmonic regurgitation.  °Small to moderate pericardial effusion, located posteriorly.  There is no  °hemodynamic significance.  °Compared to study 05/05/2020 no significant change.  ° °EKG 09/08/2020: °Probable sinus rhythm 64 bpm °Low voltage in precordial leads °QTc interval 450 msec ° °Mobile cardiac telemetry 14 days 05/05/2020 - 05/19/2020: °Dominant rhythm: Sinus. °HR 52-125 bpm. Avg HR 66 bpm, in sinus rhythm. °49 episodes of SVT, fastest at 171 bpm for 11 beats, longest for 12.4 at 124 bpm. °2.4% isolated SVE, <1% couplet/triplets. °<1% isolated VE, no couplet/triplets. °No atrial fibrillation/atrial flutter/VT/high grade AV block, sinus pause >3sec noted. °0 patient triggered events.  ° °Event monitor 03/01/2018- 03/27/2018: No arrhtymias seen ° °Recent labs: °04/28/2020: °Glucose 106, BUN/Cr 13/0.85. EGFR 68. Na/K 134/4.7. T.bili 1.3. Rest of the CMP normal °H/H 14/40. MCV 88. Platelets 199 °HbA1C N/A °Chol 181, TG 114, HDL 79, LDL 86 °TSH N/A ° °12/16/2018: °Glucose 113, BUN/Cr 7/0.52. EGFR >60. Na/K 131/4.4.  °H/H 13/38. MCV 87. Platelets 225 °Chol 175, TG 171, HDL 36, LDL 105 °TSH 0.7 normal ° ° °Review of Systems  °Cardiovascular:  Negative for chest pain, dyspnea on exertion, leg swelling, palpitations and syncope.  ° ° °   °Objective:  ° ° °  Vitals:  ° 03/11/21 1416 03/11/21 1424  °BP: (!) 146/53 (!) 144/57  °Pulse: 62 60  °Temp: 97.9 °F (36.6 °C)   °SpO2: 97% 98%  ° °Physical Exam °Vitals and nursing note reviewed.  °Constitutional:   °   General: She is not in acute distress. °Neck:  °   Vascular: No JVD.  °Cardiovascular:  °   Rate and Rhythm: Normal rate and regular rhythm.  °   Heart sounds: Normal heart sounds. No murmur heard. °Pulmonary:  °   Effort: Pulmonary effort is normal.  °   Breath sounds: Normal breath sounds. No wheezing or rales.   °Musculoskeletal:  °   Right lower leg: No edema.  °   Left lower leg: No edema.  ° ° ° ° °   °Assessment & Recommendations:  ° °87 y/o Caucasian female with hypertension, h/o CVA in 2017 with minimal left sided sensory deficit, hyperlipidemia, PSVT. ° °PSVT: °Symptoms controlled on sotalol 80 mg daily. °Given her advanced age and Qtc 456 msec, I have reduced sotalol to 40 mg daily. ° ° °Mild AI, moderate pericardial effusion: °Asymptomatic, unchanged. Monitor for now.  °Pericardial effusion has remained unchanged for >2 years. °Will stop surveillance monitoring, unless there is any clinical change. ° °Hypertension: °Likely white coat hypertension with normal BP at home. °No change made today. ° °H/o CVA: °No recent events. °Continue Aspirin 81 mg.  ° °F/u in 1 year ° °Manish J Patwardhan, MD °[facility] °Pager: [phone] °Office: [phone] °If no answer Cell [phone] °

## 2021-03-28 ENCOUNTER — Other Ambulatory Visit: Payer: Self-pay | Admitting: Cardiology

## 2021-03-28 DIAGNOSIS — E782 Mixed hyperlipidemia: Secondary | ICD-10-CM

## 2021-11-06 ENCOUNTER — Other Ambulatory Visit: Payer: Self-pay | Admitting: Cardiology

## 2021-11-06 DIAGNOSIS — I10 Essential (primary) hypertension: Secondary | ICD-10-CM

## 2022-02-27 ENCOUNTER — Other Ambulatory Visit: Payer: Self-pay | Admitting: Cardiology

## 2022-02-27 DIAGNOSIS — E782 Mixed hyperlipidemia: Secondary | ICD-10-CM

## 2022-03-16 ENCOUNTER — Encounter: Payer: Self-pay | Admitting: Cardiology

## 2022-03-16 ENCOUNTER — Ambulatory Visit: Payer: Medicare Other | Admitting: Cardiology

## 2022-03-16 VITALS — BP 136/55 | HR 69 | Ht 60.0 in | Wt 127.4 lb

## 2022-03-16 DIAGNOSIS — I471 Supraventricular tachycardia, unspecified: Secondary | ICD-10-CM

## 2022-03-16 DIAGNOSIS — I10 Essential (primary) hypertension: Secondary | ICD-10-CM

## 2022-03-16 MED ORDER — SOTALOL HCL 80 MG PO TABS: 40.0000 mg | ORAL_TABLET | Freq: Every day | ORAL | 3 refills | Status: DC

## 2022-03-16 NOTE — Progress Notes (Signed)
Patient is here for follow up visit.  Subjective:   @Patient$  ID: [name], female    DOB: [birth year], 89 y.o.   MRN: [id number]   Chief Complaint  Patient presents with   paroxysmal supraventricular tachycardia)   Follow-up    HPI  89 y/o Caucasian female with hypertension, h/o CVA in 2017 with minimal left sided sensory deficit, hyperlipidemia, PSVT.  Patient is doing well. She denies chest pain, shortness of breath, palpitations, leg edema, orthopnea, PND, TIA/syncope.   Current Outpatient Medications:    acetaminophen (TYLENOL) 500 MG tablet, Take 250 mg by mouth daily as needed for headache (pain)., Disp: , Rfl:    amLODipine (NORVASC) 10 MG tablet, TAKE 1 TABLET BY MOUTH DAILY, Disp: 90 tablet, Rfl: 3   aspirin EC 81 MG tablet, Take 81 mg by mouth daily. Swallow whole., Disp: , Rfl:    Calcium Carb-Cholecalciferol (CALCIUM 600+D3 PO), Take 1 tablet by mouth at bedtime. , Disp: , Rfl:    latanoprost (XALATAN) 0.005 % ophthalmic solution, Place 1 drop into both eyes at bedtime., Disp: , Rfl:    lisinopril (ZESTRIL) 20 MG tablet, TAKE 1 TABLET BY MOUTH EVERY DAY, Disp: 90 tablet, Rfl: 1   Multiple Vitamin (MULTIVITAMIN WITH MINERALS) TABS tablet, Take 1 tablet by mouth daily with lunch. One a day Women's, Disp: , Rfl:    omeprazole (PRILOSEC) 40 MG capsule, Take 40 mg by mouth daily. , Disp: , Rfl:    rosuvastatin (CRESTOR) 10 MG tablet, TAKE 1 TABLET BY MOUTH DAILY AT  6 PM, Disp: 90 tablet, Rfl: 3   sertraline (ZOLOFT) 25 MG tablet, Take 1 tablet by mouth once., Disp: , Rfl:    sotalol (BETAPACE) 80 MG tablet, Take 0.5 tablets (40 mg total) by mouth daily., Disp: 90 tablet, Rfl: 3    Cardiovascular studies:  EKG 03/16/2022: Sinus rhythm 66 bpm  Low voltage, otherwise normal EKG QTc 436 msec  Echocardiogram 02/24/2021:  Normal LV systolic function with visual EF 60-65%. Left ventricle cavity  is normal in size. Normal left ventricular wall thickness. Normal global   wall motion. Indeterminate diastolic filling pattern, elevated LAP.  Aortic valve sclerosis without stenosis. Mild (Grade I) aortic  regurgitation.  Mild (Grade I) mitral regurgitation.  Mild pulmonic regurgitation.  Small to moderate pericardial effusion, located posteriorly.  There is no  hemodynamic significance.  Compared to study 05/05/2020 no significant change.   Mobile cardiac telemetry 14 days 05/05/2020 - 05/19/2020: Dominant rhythm: Sinus. HR 52-125 bpm. Avg HR 66 bpm, in sinus rhythm. 49 episodes of SVT, fastest at 171 bpm for 11 beats, longest for 12.4 at 124 bpm. 2.4% isolated SVE, <1% couplet/triplets. <1% isolated VE, no couplet/triplets. No atrial fibrillation/atrial flutter/VT/high grade AV block, sinus pause >3sec noted. 0 patient triggered events.   Event monitor 03/01/2018- 03/27/2018: No arrhtymias seen  Recent labs: 05/03/2021: Glucose 110, BUN/Cr 15/0.87. EGFR 65. Na/K 138/4.4. Rest of the CMP normal H/H 13/39. MCV 88. Platelets 181 HbA1C NA Chol 178, TG 116 HDL 78, LDL 85 TSH 3.5 normal  04/28/2020: Glucose 106, BUN/Cr 13/0.85. EGFR 68. Na/K 134/4.7. T.bili 1.3. Rest of the CMP normal H/H 14/40. MCV 88. Platelets 199 HbA1C N/A Chol 181, TG 114, HDL 79, LDL 86 TSH N/A  12/16/2018: Glucose 113, BUN/Cr 7/0.52. EGFR >60. Na/K 131/4.4.  H/H 13/38. MCV 87. Platelets 225 Chol 175, TG 171, HDL 36, LDL 105 TSH 0.7 normal   Review of Systems  Cardiovascular:  Negative for chest  pain, dyspnea on exertion, leg swelling, palpitations and syncope.        Objective:    Vitals:   03/16/22 1320  BP: (!) 148/59  Pulse: 66  SpO2: 96%   Physical Exam Vitals and nursing note reviewed.  Constitutional:      General: She is not in acute distress. Neck:     Vascular: No JVD.  Cardiovascular:     Rate and Rhythm: Normal rate and regular rhythm.     Heart sounds: Normal heart sounds. No murmur heard. Pulmonary:     Effort: Pulmonary effort is normal.      Breath sounds: Normal breath sounds. No wheezing or rales.  Musculoskeletal:     Right lower leg: No edema.     Left lower leg: No edema.          Assessment & Recommendations:   89 y/o Caucasian female with hypertension, h/o CVA in 2017 with minimal left sided sensory deficit, hyperlipidemia, PSVT.  PSVT: Symptoms controlled on sotalol 40 mg daily. Refilled today. Qtc within normal limits.   Mild AI, moderate pericardial effusion: Asymptomatic, unchanged. Monitor for now.  Pericardial effusion has remained unchanged for >2 years. Will stop surveillance monitoring, unless there is any clinical change.  Hypertension: Fairly well controlled.   H/o CVA: No recent events. Continue Aspirin 81 mg.   F/u in 1 year   Nigel Mormon, MD Pager: [phone] Office: [phone]

## 2023-01-09 ENCOUNTER — Other Ambulatory Visit: Payer: Self-pay | Admitting: Cardiology

## 2023-01-09 DIAGNOSIS — E782 Mixed hyperlipidemia: Secondary | ICD-10-CM

## 2023-03-15 ENCOUNTER — Ambulatory Visit: Payer: Medicare Other | Admitting: Cardiology

## 2023-03-26 ENCOUNTER — Other Ambulatory Visit: Payer: Self-pay | Admitting: Cardiology

## 2023-03-26 DIAGNOSIS — E782 Mixed hyperlipidemia: Secondary | ICD-10-CM

## 2023-04-03 ENCOUNTER — Other Ambulatory Visit: Payer: Self-pay | Admitting: Cardiology

## 2023-04-03 DIAGNOSIS — I471 Supraventricular tachycardia, unspecified: Secondary | ICD-10-CM

## 2023-04-08 ENCOUNTER — Other Ambulatory Visit: Payer: Self-pay | Admitting: Cardiology

## 2023-04-08 DIAGNOSIS — E782 Mixed hyperlipidemia: Secondary | ICD-10-CM
